# Patient Record
Sex: Female | Born: 1945
Health system: Southern US, Community
[De-identification: ages and names within clinical notes are randomized; demographics above are authoritative.]

## PROBLEM LIST (undated history)

## (undated) DIAGNOSIS — F329 Major depressive disorder, single episode, unspecified: Secondary | ICD-10-CM

## (undated) DIAGNOSIS — M1990 Unspecified osteoarthritis, unspecified site: Secondary | ICD-10-CM

## (undated) DIAGNOSIS — M353 Polymyalgia rheumatica: Secondary | ICD-10-CM

## (undated) DIAGNOSIS — F32A Depression, unspecified: Secondary | ICD-10-CM

## (undated) DIAGNOSIS — H269 Unspecified cataract: Secondary | ICD-10-CM

## (undated) DIAGNOSIS — F909 Attention-deficit hyperactivity disorder, unspecified type: Secondary | ICD-10-CM

## (undated) DIAGNOSIS — I10 Essential (primary) hypertension: Secondary | ICD-10-CM

## (undated) DIAGNOSIS — F0390 Unspecified dementia without behavioral disturbance: Secondary | ICD-10-CM

## (undated) DIAGNOSIS — H409 Unspecified glaucoma: Secondary | ICD-10-CM

## (undated) HISTORY — DX: Attention-deficit hyperactivity disorder, unspecified type: F90.9

## (undated) HISTORY — DX: Unspecified dementia, unspecified severity, without behavioral disturbance, psychotic disturbance, mood disturbance, and anxiety: F03.90

## (undated) HISTORY — DX: Major depressive disorder, single episode, unspecified: F32.9

## (undated) HISTORY — DX: Unspecified osteoarthritis, unspecified site: M19.90

## (undated) HISTORY — DX: Polymyalgia rheumatica: M35.3

## (undated) HISTORY — DX: Unspecified cataract: H26.9

## (undated) HISTORY — DX: Depression, unspecified: F32.A

## (undated) HISTORY — DX: Unspecified glaucoma: H40.9

## (undated) HISTORY — DX: Essential (primary) hypertension: I10

---

## 1999-06-16 ENCOUNTER — Ambulatory Visit (HOSPITAL_COMMUNITY): Admission: RE | Admit: 1999-06-16 | Discharge: 1999-06-16 | Payer: Self-pay | Admitting: Family Medicine

## 1999-06-16 ENCOUNTER — Encounter: Payer: Self-pay | Admitting: Family Medicine

## 1999-06-23 ENCOUNTER — Encounter: Payer: Self-pay | Admitting: Family Medicine

## 1999-06-23 ENCOUNTER — Ambulatory Visit (HOSPITAL_COMMUNITY): Admission: RE | Admit: 1999-06-23 | Discharge: 1999-06-23 | Payer: Self-pay | Admitting: Family Medicine

## 1999-10-06 ENCOUNTER — Other Ambulatory Visit: Admission: RE | Admit: 1999-10-06 | Discharge: 1999-10-06 | Payer: Self-pay | Admitting: Family Medicine

## 1999-12-29 ENCOUNTER — Ambulatory Visit (HOSPITAL_COMMUNITY): Admission: RE | Admit: 1999-12-29 | Discharge: 1999-12-29 | Payer: Self-pay | Admitting: Family Medicine

## 1999-12-29 ENCOUNTER — Encounter: Payer: Self-pay | Admitting: Family Medicine

## 2000-07-12 ENCOUNTER — Encounter: Payer: Self-pay | Admitting: Family Medicine

## 2000-07-12 ENCOUNTER — Encounter: Admission: RE | Admit: 2000-07-12 | Discharge: 2000-07-12 | Payer: Self-pay | Admitting: Family Medicine

## 2001-01-02 ENCOUNTER — Encounter: Payer: Self-pay | Admitting: Family Medicine

## 2001-01-02 ENCOUNTER — Encounter: Admission: RE | Admit: 2001-01-02 | Discharge: 2001-01-02 | Payer: Self-pay | Admitting: Family Medicine

## 2001-08-09 ENCOUNTER — Ambulatory Visit (HOSPITAL_COMMUNITY): Admission: RE | Admit: 2001-08-09 | Discharge: 2001-08-09 | Payer: Self-pay | Admitting: Family Medicine

## 2001-08-09 ENCOUNTER — Encounter: Payer: Self-pay | Admitting: Family Medicine

## 2001-08-27 ENCOUNTER — Other Ambulatory Visit: Admission: RE | Admit: 2001-08-27 | Discharge: 2001-08-27 | Payer: Self-pay | Admitting: Family Medicine

## 2002-05-08 ENCOUNTER — Encounter: Admission: RE | Admit: 2002-05-08 | Discharge: 2002-07-22 | Payer: Self-pay | Admitting: Family Medicine

## 2002-08-20 ENCOUNTER — Ambulatory Visit (HOSPITAL_COMMUNITY): Admission: RE | Admit: 2002-08-20 | Discharge: 2002-08-20 | Payer: Self-pay | Admitting: Family Medicine

## 2002-08-20 ENCOUNTER — Encounter: Payer: Self-pay | Admitting: Family Medicine

## 2002-10-14 ENCOUNTER — Other Ambulatory Visit: Admission: RE | Admit: 2002-10-14 | Discharge: 2002-10-14 | Payer: Self-pay | Admitting: Family Medicine

## 2002-11-19 ENCOUNTER — Encounter: Admission: RE | Admit: 2002-11-19 | Discharge: 2002-11-19 | Payer: Self-pay | Admitting: Rheumatology

## 2002-11-19 ENCOUNTER — Encounter: Payer: Self-pay | Admitting: Rheumatology

## 2003-09-16 ENCOUNTER — Encounter: Admission: RE | Admit: 2003-09-16 | Discharge: 2003-09-16 | Payer: Self-pay | Admitting: Obstetrics and Gynecology

## 2003-10-22 ENCOUNTER — Other Ambulatory Visit: Admission: RE | Admit: 2003-10-22 | Discharge: 2003-10-22 | Payer: Self-pay | Admitting: Family Medicine

## 2004-01-21 ENCOUNTER — Encounter: Admission: RE | Admit: 2004-01-21 | Discharge: 2004-01-21 | Payer: Self-pay | Admitting: Rheumatology

## 2004-10-20 ENCOUNTER — Encounter: Admission: RE | Admit: 2004-10-20 | Discharge: 2004-10-20 | Payer: Self-pay | Admitting: Family Medicine

## 2005-10-24 ENCOUNTER — Other Ambulatory Visit: Admission: RE | Admit: 2005-10-24 | Discharge: 2005-10-24 | Payer: Self-pay | Admitting: Family Medicine

## 2005-11-17 ENCOUNTER — Encounter: Admission: RE | Admit: 2005-11-17 | Discharge: 2005-11-17 | Payer: Self-pay | Admitting: Family Medicine

## 2006-11-06 ENCOUNTER — Other Ambulatory Visit: Admission: RE | Admit: 2006-11-06 | Discharge: 2006-11-06 | Payer: Self-pay | Admitting: Family Medicine

## 2006-11-19 ENCOUNTER — Encounter: Admission: RE | Admit: 2006-11-19 | Discharge: 2006-11-19 | Payer: Self-pay | Admitting: Family Medicine

## 2007-04-24 ENCOUNTER — Encounter: Admission: RE | Admit: 2007-04-24 | Discharge: 2007-04-24 | Payer: Self-pay | Admitting: Rheumatology

## 2007-11-18 ENCOUNTER — Encounter: Admission: RE | Admit: 2007-11-18 | Discharge: 2008-01-13 | Payer: Self-pay | Admitting: Orthopedic Surgery

## 2007-11-20 ENCOUNTER — Encounter: Admission: RE | Admit: 2007-11-20 | Discharge: 2007-11-20 | Payer: Self-pay | Admitting: Orthopedic Surgery

## 2007-11-21 ENCOUNTER — Encounter: Admission: RE | Admit: 2007-11-21 | Discharge: 2007-11-21 | Payer: Self-pay | Admitting: Family Medicine

## 2008-01-30 ENCOUNTER — Inpatient Hospital Stay (HOSPITAL_COMMUNITY): Admission: RE | Admit: 2008-01-30 | Discharge: 2008-02-03 | Payer: Self-pay | Admitting: Orthopedic Surgery

## 2008-04-02 ENCOUNTER — Encounter: Admission: RE | Admit: 2008-04-02 | Discharge: 2008-05-27 | Payer: Self-pay | Admitting: Orthopedic Surgery

## 2008-08-14 ENCOUNTER — Encounter: Admission: RE | Admit: 2008-08-14 | Discharge: 2008-08-14 | Payer: Self-pay | Admitting: Orthopedic Surgery

## 2009-09-27 ENCOUNTER — Encounter: Admission: RE | Admit: 2009-09-27 | Discharge: 2009-09-27 | Payer: Self-pay | Admitting: Orthopedic Surgery

## 2009-10-09 HISTORY — PX: PARTIAL HIP ARTHROPLASTY: SHX733

## 2009-11-10 ENCOUNTER — Encounter: Admission: RE | Admit: 2009-11-10 | Discharge: 2009-11-10 | Payer: Self-pay | Admitting: Family Medicine

## 2009-11-17 ENCOUNTER — Encounter: Payer: Self-pay | Admitting: Orthopedic Surgery

## 2009-11-23 ENCOUNTER — Inpatient Hospital Stay (HOSPITAL_COMMUNITY): Admission: RE | Admit: 2009-11-23 | Discharge: 2009-11-27 | Payer: Self-pay | Admitting: Orthopedic Surgery

## 2010-01-20 ENCOUNTER — Encounter: Admission: RE | Admit: 2010-01-20 | Discharge: 2010-03-16 | Payer: Self-pay | Admitting: Orthopedic Surgery

## 2010-11-10 ENCOUNTER — Other Ambulatory Visit: Payer: Self-pay | Admitting: Family Medicine

## 2010-11-10 DIAGNOSIS — Z1231 Encounter for screening mammogram for malignant neoplasm of breast: Secondary | ICD-10-CM

## 2010-11-10 DIAGNOSIS — Z1239 Encounter for other screening for malignant neoplasm of breast: Secondary | ICD-10-CM

## 2010-11-17 ENCOUNTER — Ambulatory Visit
Admission: RE | Admit: 2010-11-17 | Discharge: 2010-11-17 | Disposition: A | Discharge status: Home / Self Care | Payer: MEDICARE | Source: Ambulatory Visit | Attending: Family Medicine | Admitting: Family Medicine

## 2010-11-17 DIAGNOSIS — Z1231 Encounter for screening mammogram for malignant neoplasm of breast: Secondary | ICD-10-CM

## 2010-12-09 IMAGING — CR DG CHEST 2V
2 series · 2 of 2 positions shown · non-contrast
Comparison: 01/24/2008

CLINICAL DATA: Degenerative joint disease.  Preop.

CHEST - 2 VIEW

[view not recorded (1 of 2)]
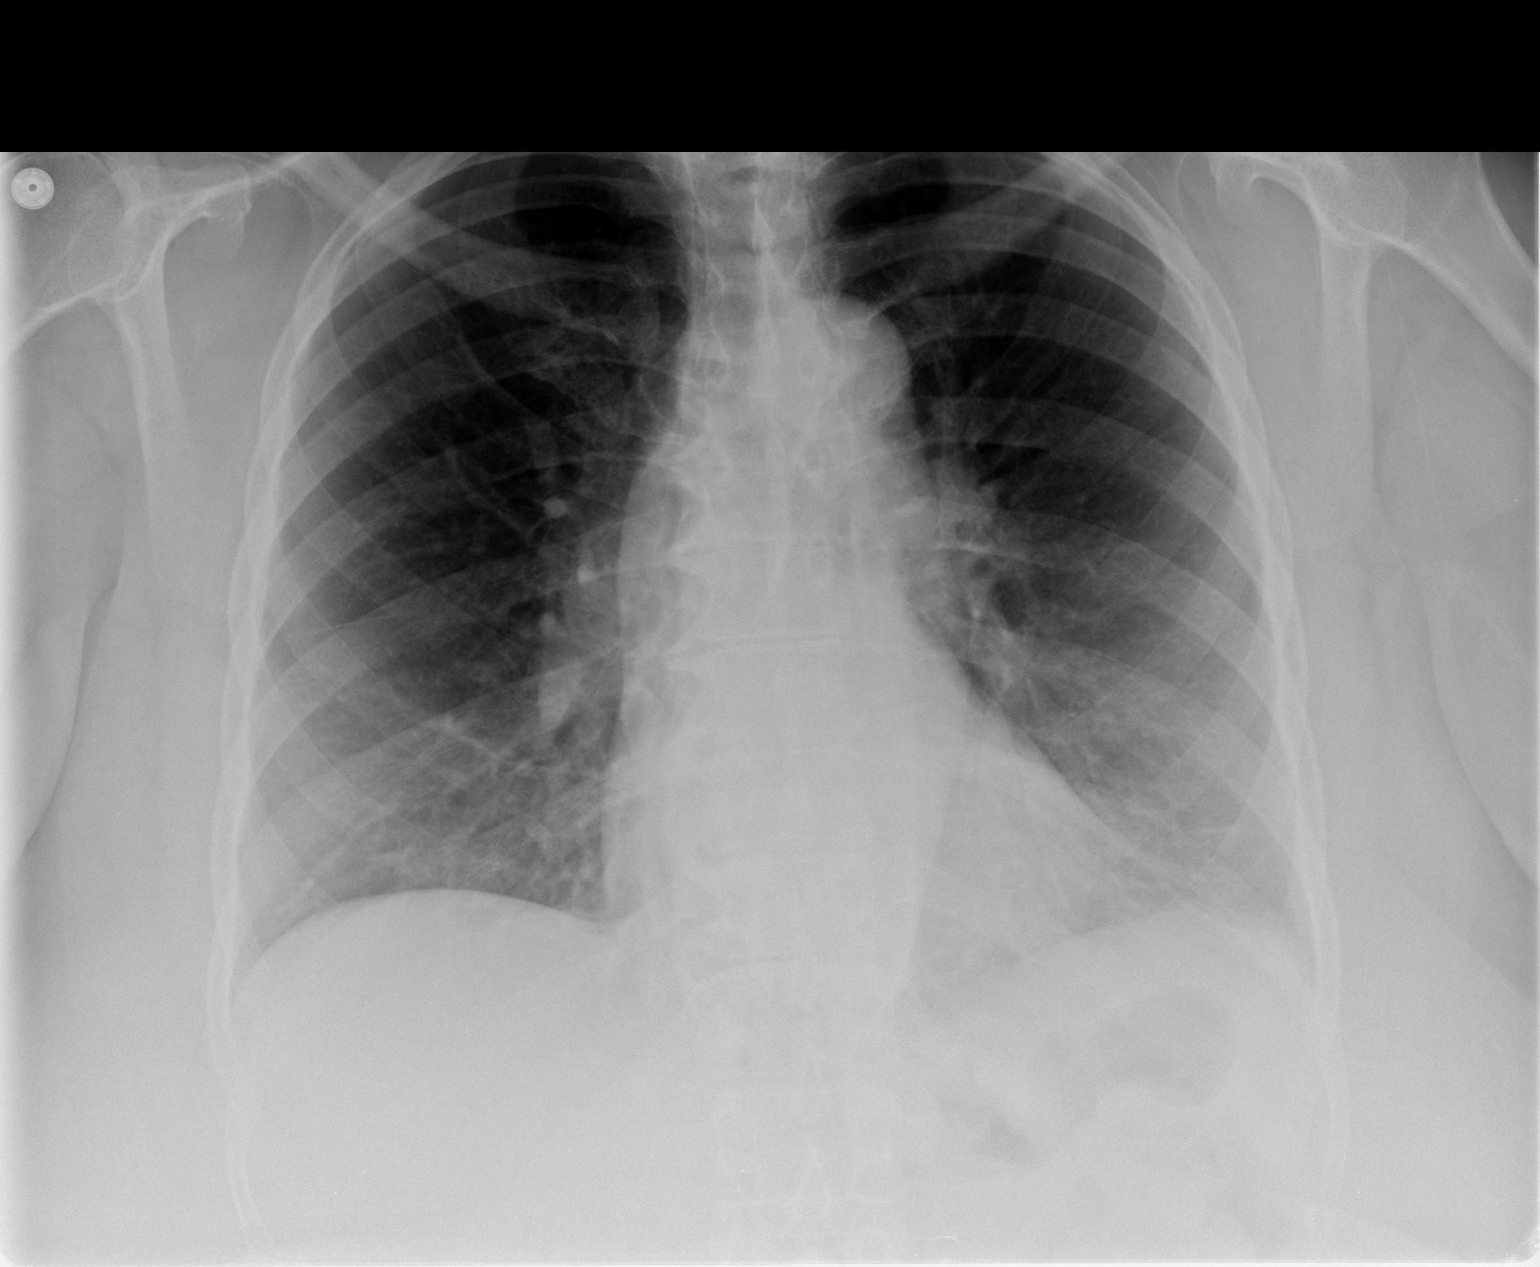

[view not recorded (2 of 2)]
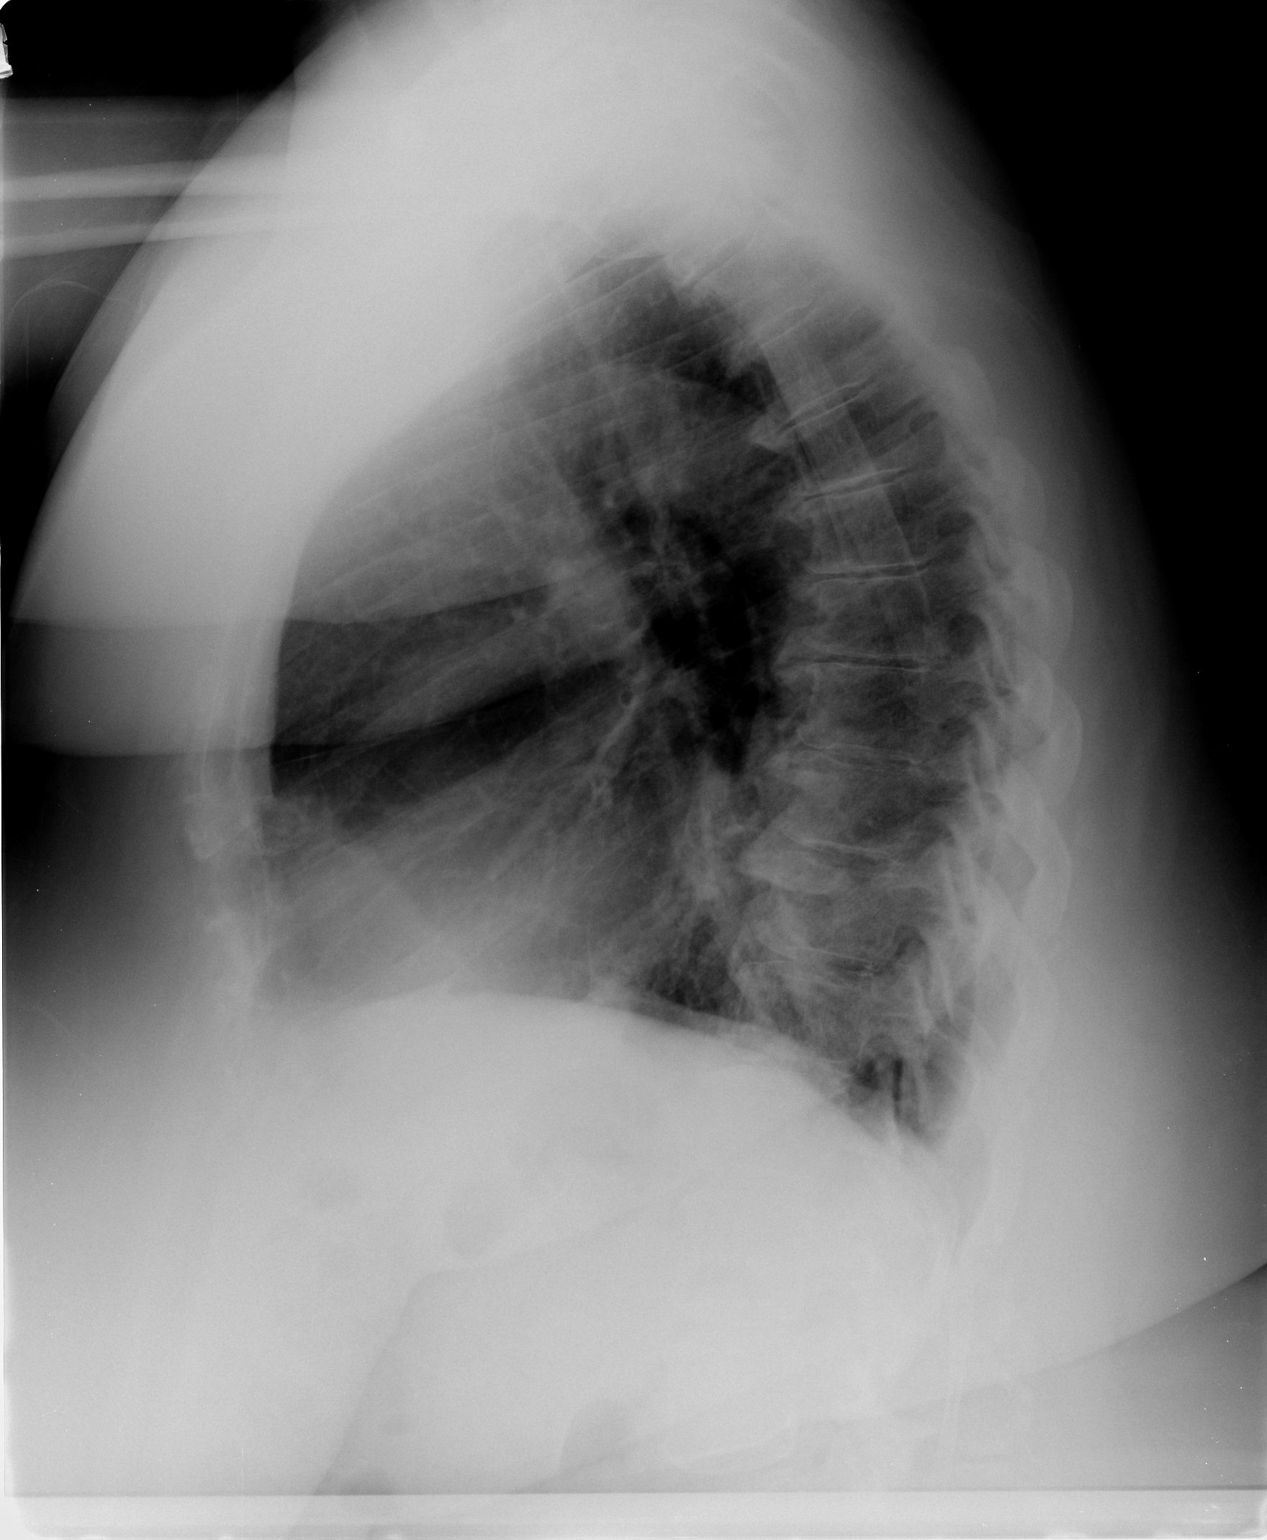

[2 of 2 positions shown; findings below may reference images not displayed]

FINDINGS: The heart is borderline enlarged.  Pulmonary vascularity
is within normal limits.  There is no definite consolidation.
Linear atelectasis has developed at the left base.  No pneumothorax
or pleural effusion.
IMPRESSION: Borderline cardiomegaly without pulmonary edema.  Linear
atelectasis at the left base.

## 2010-12-28 LAB — CBC
MCHC: 34.4 g/dL (ref 30.0–36.0)
MCV: 92.4 fL (ref 78.0–100.0)
Platelets: 214 10*3/uL (ref 150–400)
RDW: 13.4 % (ref 11.5–15.5)

## 2010-12-28 LAB — PROTIME-INR
INR: 1.95 — ABNORMAL HIGH (ref 0.00–1.49)
INR: 2.39 — ABNORMAL HIGH (ref 0.00–1.49)
Prothrombin Time: 14.6 seconds (ref 11.6–15.2)
Prothrombin Time: 22.1 seconds — ABNORMAL HIGH (ref 11.6–15.2)
Prothrombin Time: 25.1 seconds — ABNORMAL HIGH (ref 11.6–15.2)

## 2010-12-28 LAB — COMPREHENSIVE METABOLIC PANEL
ALT: 14 U/L (ref 0–35)
AST: 18 U/L (ref 0–37)
CO2: 28 mEq/L (ref 19–32)
Calcium: 9.5 mg/dL (ref 8.4–10.5)
Creatinine, Ser: 0.71 mg/dL (ref 0.4–1.2)
GFR calc Af Amer: >60 mL/min (ref >60)
GFR calc non Af Amer: >60 mL/min (ref >60)
Sodium: 138 mEq/L (ref 135–145)
Total Protein: 7.4 g/dL (ref 6.0–8.3)

## 2010-12-28 LAB — URINALYSIS, ROUTINE W REFLEX MICROSCOPIC
Glucose, UA: NEGATIVE mg/dL
Hgb urine dipstick: NEGATIVE
Ketones, ur: NEGATIVE mg/dL
Protein, ur: NEGATIVE mg/dL
Urobilinogen, UA: 0.2 mg/dL (ref 0.0–1.0)

## 2010-12-28 LAB — TYPE AND SCREEN
ABO/RH(D): B POS
Antibody Screen: NEGATIVE

## 2010-12-28 LAB — APTT: aPTT: 35 seconds (ref 24–37)

## 2011-01-17 ENCOUNTER — Other Ambulatory Visit (HOSPITAL_COMMUNITY)
Admission: RE | Admit: 2011-01-17 | Discharge: 2011-01-17 | Disposition: A | Discharge status: Home / Self Care | Payer: MEDICARE | Source: Ambulatory Visit | Attending: Family Medicine | Admitting: Family Medicine

## 2011-01-17 DIAGNOSIS — Z01419 Encounter for gynecological examination (general) (routine) without abnormal findings: Secondary | ICD-10-CM | POA: Insufficient documentation

## 2011-01-17 DIAGNOSIS — Z1159 Encounter for screening for other viral diseases: Secondary | ICD-10-CM | POA: Insufficient documentation

## 2011-02-06 ENCOUNTER — Encounter: Payer: Medicare Other | Attending: Family Medicine | Admitting: *Deleted

## 2011-02-06 DIAGNOSIS — E669 Obesity, unspecified: Secondary | ICD-10-CM | POA: Insufficient documentation

## 2011-02-06 DIAGNOSIS — Z713 Dietary counseling and surveillance: Secondary | ICD-10-CM | POA: Insufficient documentation

## 2011-02-21 NOTE — Op Note (Signed)
NAMESHERRONDA, SWEIGERT NO.:  [id number]   MEDICAL RECORD NO.:  [id number]          PATIENT TYPE:  INP   LOCATION:  5037                         FACILITY:  [facility]   PHYSICIAN:  Myrtie Neither, MD      DATE OF BIRTH:  September 09, 1946   DATE OF PROCEDURE:  01/30/2008  DATE OF DISCHARGE:                               OPERATIVE REPORT   PREOPERATIVE DIAGNOSIS:  Degenerative joint disease, left hip.   POSTOPERATIVE DIAGNOSES:  Degenerative joint disease, left hip.   ANESTHESIA:  General.   PROCEDURE:  Left total hip arthroplasty, Biomet implant.  The patient  taken to the operating room.  After given adequate preop medications  given general anesthesia and was intubated.  The patient was placed in  the left lateral position, left hip was prepped with DuraPrep and draped  in sterile manner.  Bovie was used for hemostasis.  Posterior southern  approach was made over the left hip going through the skin and  subcutaneous tissue down to the fascia.  Short rotators identified and  resected.  Cast was identified and completely resected.  With use of an  oscillating saw, femoral head had to be cut in order to dislocate the  joint.  Neck cutting guide was put in place and appropriate cut of the  neck was done.  Femoral head was removed.  Osteophytes about the neck  was resected with the use of an osteotome.  Reaming of the acetabulum  was then started with 43 mm.  Reaming was done up to 53 acetabular for a  54 component.  Reaming was done down to good bleeding bone.  Trial was  put in place and found to seat quite well.  Acetabular cup porous coated  with 3 holes was then put in place and further fixed with 3 acetabular  screws.  Attention was then turned to the femur, further resection of  the neck was done.  Followed by reaming down the femoral canal and use  of broaches was done up to 12 mm, was found to have good calcar and bony  contact filling proximal end  quite well.  Calcar  reamer was put in  place and further calcar resection was done.  Standard head and neck was  then tried, hip was reduced.  Good leg length, good flexion and  extension, good stability, and no telescoping.  Trials were removed.  Copious irrigation done of the acetabulum and femoral canal.  The kind  of components that were put in place was a 54-mm porous-coated shell  with 3 holes.  The 3 bone screws display the stability of the  acetabulum.  The acetabular liner 10 degree 36-mm and a 36-mm head type  1 standard.  With seeding of the implants, reduction was then done again  and full flexion and full extension, good internal and external  rotation, and good stability was demonstrated.  Further copious  irrigation was done followed by wound closure with the use of 0 Vicryl  for the fascia, 2-0 for the subcutaneous, and skin staples for the skin.  Compressive dressing was applied and hip abduction  pillow was applied.  The patient tolerated the procedure quite well and went to recovery room  in stable and satisfactory condition.      Myrtie Neither, MD  Electronically Signed     AC/MEDQ  D:  01/30/2008  T:  01/31/2008  Job:  [id number]

## 2011-02-24 NOTE — Discharge Summary (Signed)
NAMELUVENIA, CRANFORD NO.:  [id number]   MEDICAL RECORD NO.:  [id number]          PATIENT TYPE:  INP   LOCATION:  5037                         FACILITY:  [facility]   PHYSICIAN:  Myrtie Neither, MD      DATE OF BIRTH:  1946/04/16   DATE OF ADMISSION:  01/30/2008  DATE OF DISCHARGE:  02/03/2008                               DISCHARGE SUMMARY   ADMITTING DIAGNOSIS:  Degenerative joint disease, left hip.   DISCHARGE DIAGNOSIS:  Degenerative joint disease, left hip.   COMPLICATIONS:  None.   INFECTION:  None.   OPERATION:  Left total hip arthroplasty, Biomet implant.   PERTINENT HISTORY:  This is a 65 year old female who had been followed  in the office for degenerative arthritis of her left hip which  progressively worsened over the past few months.  The patient also has  had difficulty with arthritis in the right knee as well.  The patient  was using walker as well as cane with anti-inflammatories and pain  medications.  Pertinent physical was that of a left hip very ankylotic,  limited rotation, and minimal difficulty on flexion, palpable and  audible click and crepitus on attempted range of motion.  X-rays  revealed loss of joint space sclerosis and degenerative joint changes.   HOSPITAL COURSE:  The patient underwent preop medical evaluation with  CBC, EKG, chest x-ray, PT, PTT, platelet count, UA, and CMET.  The  patient's laboratories were stable enough to undergo surgery and  underwent left total hip arthroplasty.  Postop course was fairly benign.  The patient was started on partial weightbearing on the left side with  use of walker, prophylactic IV antibiotics as well as prophylactic  Coumadin.  The patient progressed fairly well with physical therapy, and  pain was brought under control and was able to be discharged on Percocet  1-2 q.6. h. p.r.n., Skelaxin 400 mg 2 b.i.d., Coumadin 5 mg daily for  INRs, Feosol 325 mg b.i.d.  Home health and physical  therapy were  arranged.  The patient progressed well enough to be discharged and will  return to the office in 1 week.   CONDITION ON DISCHARGE:  She was discharged in stable and satisfactory  condition.      Myrtie Neither, MD  Electronically Signed     AC/MEDQ  D:  03/04/2008  T:  03/05/2008  Job:  [id number]

## 2011-04-03 ENCOUNTER — Ambulatory Visit: Payer: Medicare Other | Admitting: *Deleted

## 2011-04-06 ENCOUNTER — Encounter: Payer: Medicare Other | Attending: Family Medicine | Admitting: *Deleted

## 2011-04-06 DIAGNOSIS — Z713 Dietary counseling and surveillance: Secondary | ICD-10-CM | POA: Insufficient documentation

## 2011-04-06 DIAGNOSIS — E669 Obesity, unspecified: Secondary | ICD-10-CM | POA: Insufficient documentation

## 2011-07-04 LAB — URINALYSIS, ROUTINE W REFLEX MICROSCOPIC
Bilirubin Urine: NEGATIVE
Glucose, UA: NEGATIVE
Ketones, ur: NEGATIVE
Protein, ur: NEGATIVE

## 2011-07-04 LAB — CBC
HCT: 27.3 — ABNORMAL LOW
HCT: 30.2 — ABNORMAL LOW
HCT: 40.6
Hemoglobin: 13.9
MCHC: 33.8
MCHC: 34.3
MCHC: 35.1
MCV: 91.6
MCV: 91.8
Platelets: 157
Platelets: 195
Platelets: 248
RDW: 13.1
RDW: 13.1
RDW: 13.2
WBC: 10.4

## 2011-07-04 LAB — COMPREHENSIVE METABOLIC PANEL
ALT: 13
CO2: 23
Calcium: 9.6
Creatinine, Ser: 0.56
GFR calc non Af Amer: >60
Glucose, Bld: 103 — ABNORMAL HIGH
Total Bilirubin: 0.5

## 2011-07-04 LAB — APTT: aPTT: 32

## 2011-07-04 LAB — PROTIME-INR
Prothrombin Time: 12.3
Prothrombin Time: 22.3 — ABNORMAL HIGH

## 2011-07-04 LAB — ABO/RH: ABO/RH(D): B POS

## 2011-07-06 ENCOUNTER — Ambulatory Visit: Payer: Medicare Other | Admitting: *Deleted

## 2011-10-10 HISTORY — PX: REPLACEMENT TOTAL KNEE: SUR1224

## 2011-11-17 ENCOUNTER — Other Ambulatory Visit: Payer: Self-pay | Admitting: Family Medicine

## 2011-11-17 DIAGNOSIS — Z1231 Encounter for screening mammogram for malignant neoplasm of breast: Secondary | ICD-10-CM

## 2011-12-05 ENCOUNTER — Other Ambulatory Visit: Payer: Self-pay | Admitting: Family Medicine

## 2011-12-05 ENCOUNTER — Ambulatory Visit
Admission: RE | Admit: 2011-12-05 | Discharge: 2011-12-05 | Disposition: A | Discharge status: Home / Self Care | Payer: Medicare Other | Source: Ambulatory Visit | Attending: Family Medicine | Admitting: Family Medicine

## 2011-12-05 DIAGNOSIS — Z1231 Encounter for screening mammogram for malignant neoplasm of breast: Secondary | ICD-10-CM

## 2013-07-04 ENCOUNTER — Other Ambulatory Visit: Payer: Self-pay

## 2013-07-04 DIAGNOSIS — Z1231 Encounter for screening mammogram for malignant neoplasm of breast: Secondary | ICD-10-CM

## 2013-07-24 ENCOUNTER — Ambulatory Visit
Admission: RE | Admit: 2013-07-24 | Discharge: 2013-07-24 | Disposition: A | Discharge status: Home / Self Care | Payer: Medicare Other | Source: Ambulatory Visit

## 2013-07-24 DIAGNOSIS — Z1231 Encounter for screening mammogram for malignant neoplasm of breast: Secondary | ICD-10-CM

## 2014-07-21 ENCOUNTER — Other Ambulatory Visit: Payer: Self-pay

## 2014-07-21 DIAGNOSIS — Z1239 Encounter for other screening for malignant neoplasm of breast: Secondary | ICD-10-CM

## 2014-08-11 ENCOUNTER — Encounter (INDEPENDENT_AMBULATORY_CARE_PROVIDER_SITE_OTHER): Payer: Self-pay

## 2014-08-11 ENCOUNTER — Ambulatory Visit
Admission: RE | Admit: 2014-08-11 | Discharge: 2014-08-11 | Disposition: A | Discharge status: Home / Self Care | Payer: Medicare Other | Source: Ambulatory Visit

## 2014-08-11 DIAGNOSIS — Z1239 Encounter for other screening for malignant neoplasm of breast: Secondary | ICD-10-CM

## 2015-08-10 ENCOUNTER — Other Ambulatory Visit: Payer: Self-pay

## 2015-08-10 DIAGNOSIS — Z1231 Encounter for screening mammogram for malignant neoplasm of breast: Secondary | ICD-10-CM

## 2015-08-25 ENCOUNTER — Ambulatory Visit
Admission: RE | Admit: 2015-08-25 | Discharge: 2015-08-25 | Disposition: A | Discharge status: Home / Self Care | Payer: PPO | Source: Ambulatory Visit

## 2015-08-25 DIAGNOSIS — Z1231 Encounter for screening mammogram for malignant neoplasm of breast: Secondary | ICD-10-CM

## 2015-11-06 DIAGNOSIS — L235 Allergic contact dermatitis due to other chemical products: Secondary | ICD-10-CM | POA: Diagnosis not present

## 2015-12-29 DIAGNOSIS — R7309 Other abnormal glucose: Secondary | ICD-10-CM | POA: Diagnosis not present

## 2015-12-29 DIAGNOSIS — E6609 Other obesity due to excess calories: Secondary | ICD-10-CM | POA: Diagnosis not present

## 2015-12-29 DIAGNOSIS — I10 Essential (primary) hypertension: Secondary | ICD-10-CM | POA: Diagnosis not present

## 2015-12-29 DIAGNOSIS — M1250 Traumatic arthropathy, unspecified site: Secondary | ICD-10-CM | POA: Diagnosis not present

## 2016-01-01 DIAGNOSIS — F064 Anxiety disorder due to known physiological condition: Secondary | ICD-10-CM | POA: Diagnosis not present

## 2016-01-01 DIAGNOSIS — F900 Attention-deficit hyperactivity disorder, predominantly inattentive type: Secondary | ICD-10-CM | POA: Diagnosis not present

## 2016-01-01 DIAGNOSIS — F331 Major depressive disorder, recurrent, moderate: Secondary | ICD-10-CM | POA: Diagnosis not present

## 2016-02-10 DIAGNOSIS — M1200 Chronic postrheumatic arthropathy [Jaccoud], unspecified site: Secondary | ICD-10-CM | POA: Diagnosis not present

## 2016-02-10 DIAGNOSIS — I10 Essential (primary) hypertension: Secondary | ICD-10-CM | POA: Diagnosis not present

## 2016-02-10 DIAGNOSIS — F331 Major depressive disorder, recurrent, moderate: Secondary | ICD-10-CM | POA: Diagnosis not present

## 2016-02-10 DIAGNOSIS — E6609 Other obesity due to excess calories: Secondary | ICD-10-CM | POA: Diagnosis not present

## 2016-05-03 DIAGNOSIS — E0800 Diabetes mellitus due to underlying condition with hyperosmolarity without nonketotic hyperglycemic-hyperosmolar coma (NKHHC): Secondary | ICD-10-CM | POA: Diagnosis not present

## 2016-05-03 DIAGNOSIS — F900 Attention-deficit hyperactivity disorder, predominantly inattentive type: Secondary | ICD-10-CM | POA: Diagnosis not present

## 2016-05-03 DIAGNOSIS — F064 Anxiety disorder due to known physiological condition: Secondary | ICD-10-CM | POA: Diagnosis not present

## 2016-05-03 DIAGNOSIS — I10 Essential (primary) hypertension: Secondary | ICD-10-CM | POA: Diagnosis not present

## 2016-06-28 DIAGNOSIS — H40033 Anatomical narrow angle, bilateral: Secondary | ICD-10-CM | POA: Diagnosis not present

## 2016-06-28 DIAGNOSIS — H04123 Dry eye syndrome of bilateral lacrimal glands: Secondary | ICD-10-CM | POA: Diagnosis not present

## 2016-07-27 ENCOUNTER — Other Ambulatory Visit: Payer: Self-pay | Admitting: Family Medicine

## 2016-07-27 DIAGNOSIS — Z1231 Encounter for screening mammogram for malignant neoplasm of breast: Secondary | ICD-10-CM

## 2016-08-04 DIAGNOSIS — H04123 Dry eye syndrome of bilateral lacrimal glands: Secondary | ICD-10-CM | POA: Diagnosis not present

## 2016-08-25 ENCOUNTER — Ambulatory Visit
Admission: RE | Admit: 2016-08-25 | Discharge: 2016-08-25 | Disposition: A | Discharge status: Home / Self Care | Payer: PPO | Source: Ambulatory Visit | Attending: Family Medicine | Admitting: Family Medicine

## 2016-08-25 DIAGNOSIS — Z1231 Encounter for screening mammogram for malignant neoplasm of breast: Secondary | ICD-10-CM | POA: Diagnosis not present

## 2016-08-30 ENCOUNTER — Other Ambulatory Visit: Payer: Self-pay | Admitting: Family Medicine

## 2016-08-30 DIAGNOSIS — R928 Other abnormal and inconclusive findings on diagnostic imaging of breast: Secondary | ICD-10-CM

## 2016-09-06 ENCOUNTER — Ambulatory Visit
Admission: RE | Admit: 2016-09-06 | Discharge: 2016-09-06 | Disposition: A | Discharge status: Home / Self Care | Payer: PPO | Source: Ambulatory Visit | Attending: Family Medicine | Admitting: Family Medicine

## 2016-09-06 ENCOUNTER — Other Ambulatory Visit: Payer: Self-pay | Admitting: Family Medicine

## 2016-09-06 DIAGNOSIS — R921 Mammographic calcification found on diagnostic imaging of breast: Secondary | ICD-10-CM | POA: Diagnosis not present

## 2016-09-06 DIAGNOSIS — R928 Other abnormal and inconclusive findings on diagnostic imaging of breast: Secondary | ICD-10-CM

## 2016-09-07 ENCOUNTER — Ambulatory Visit
Admission: RE | Admit: 2016-09-07 | Discharge: 2016-09-07 | Disposition: A | Discharge status: Home / Self Care | Payer: PPO | Source: Ambulatory Visit | Attending: Family Medicine | Admitting: Family Medicine

## 2016-09-07 DIAGNOSIS — N6311 Unspecified lump in the right breast, upper outer quadrant: Secondary | ICD-10-CM | POA: Diagnosis not present

## 2016-09-07 DIAGNOSIS — R928 Other abnormal and inconclusive findings on diagnostic imaging of breast: Secondary | ICD-10-CM

## 2016-09-07 DIAGNOSIS — D241 Benign neoplasm of right breast: Secondary | ICD-10-CM | POA: Diagnosis not present

## 2016-09-18 DIAGNOSIS — E119 Type 2 diabetes mellitus without complications: Secondary | ICD-10-CM | POA: Diagnosis not present

## 2016-09-18 DIAGNOSIS — F064 Anxiety disorder due to known physiological condition: Secondary | ICD-10-CM | POA: Diagnosis not present

## 2016-09-18 DIAGNOSIS — F900 Attention-deficit hyperactivity disorder, predominantly inattentive type: Secondary | ICD-10-CM | POA: Diagnosis not present

## 2016-09-18 DIAGNOSIS — I10 Essential (primary) hypertension: Secondary | ICD-10-CM | POA: Diagnosis not present

## 2016-09-26 DIAGNOSIS — H04123 Dry eye syndrome of bilateral lacrimal glands: Secondary | ICD-10-CM | POA: Diagnosis not present

## 2016-11-24 DIAGNOSIS — I10 Essential (primary) hypertension: Secondary | ICD-10-CM | POA: Diagnosis not present

## 2016-11-24 DIAGNOSIS — F331 Major depressive disorder, recurrent, moderate: Secondary | ICD-10-CM | POA: Diagnosis not present

## 2016-11-24 DIAGNOSIS — F900 Attention-deficit hyperactivity disorder, predominantly inattentive type: Secondary | ICD-10-CM | POA: Diagnosis not present

## 2016-12-28 ENCOUNTER — Encounter: Payer: PPO | Attending: Psychology | Admitting: Psychology

## 2016-12-28 DIAGNOSIS — F331 Major depressive disorder, recurrent, moderate: Secondary | ICD-10-CM | POA: Insufficient documentation

## 2016-12-28 DIAGNOSIS — F909 Attention-deficit hyperactivity disorder, unspecified type: Secondary | ICD-10-CM | POA: Diagnosis not present

## 2016-12-28 DIAGNOSIS — F900 Attention-deficit hyperactivity disorder, predominantly inattentive type: Secondary | ICD-10-CM | POA: Diagnosis not present

## 2017-01-10 NOTE — Progress Notes (Signed)
Neuropsychological Consultation   Patient:   Pam Knapp   DOB:   1946/07/19  MR Number:  [id number]  Location:  [facility] [address], [place] [id number] [place] [address] Dept: [phone]           Date of Service:   12/28/2016  Start Time:   11 AM End Time:   12 PM  Provider/Observer:  Ilean Skill, Psy.D.       Clinical Neuropsychologist       Billing Code/Service: Neurobehavioral Status Interview  Chief Complaint:    The patient reports that she has long-standing issues with attention concentration the patient reports that she saw someone but she was unsure of who was around 10 years ago for issues of hypertension in constant duration she reports that her attentional issues are causing significant difficulties in her attempts to further education. The patient reports that now she is pretty positive that her attentional issues all her life were related to ADHD. However, the patient reports that her memory has been worse recently than 10 years ago. She reports that she is not sure whether this worsening is because of ADHD or something else. The patient describes poor attention, poor organization and poor planning. She does report that all of these symptoms appear to be worsening. The patient reports that she never took any medications or had any interventions for her suspected attention deficit disorder. The patient also reports that she has a history of depression and has been treated with various SSRI medications. The patient denies any geographic disorientation for changes in her language functioning. She reports that she has been having some orthopedic issues more recently and has had both knee and hip replacement interventions.  Reason for Service:  The patient is a 71 year old female that was was referred for neuropsychological evaluation by Dr. Criss Rosales.  The  purpose of this evaluation was to help with differential diagnostic considerations regarding the patient's complaint of attention and concentration deficits and memory deficits. She does acknowledge some worsening over the past 10 years but reports that she is always had these issues and they have been present her entire life.  Current Status:  The patient describes ongoing and worsening issues with attention concentration and memory issues. She denies any sudden changes in cognitive functioning but more of a steady change over time. She has had some issues with orthopedic-based pain as well as history of dealing with depression.  Reliability of Information: Information is provided by the patient as well as review of available medical records.  Behavioral Observation: Pam Knapp  presents as a 71 y.o.-year-old Right African American Female who appeared her stated age. her dress was Appropriate and she was Well Groomed and her manners were Appropriate to the situation.  her participation was indicative of Appropriate behaviors.  There were not any physical disabilities noted.  she displayed an appropriate level of cooperation and motivation.     Interactions:    Active Appropriate  Attention:   The patient did appear to be distracted at times during the clinical interview and these appeared more related to internal preoccupations. and attention span appeared shorter than expected for age  Memory:   within normal limits; recent and remote memory intact  Visuo-spatial:  within normal limits  Speech (Volume):  normal  Speech:   normal; normal  Thought Process:  Coherent  Though Content:  WNL; not suicidal  Orientation:  person, place, time/date and situation  Judgment:   Fair  Planning:   Fair  Affect:    Depressed  Mood:    Depressed  Insight:   Fair  Intelligence:   normal  Family Med/Psych History: No family history on file.  Risk of Suicide/Violence: low the patient  denies any suicidal or homicidal ideation  Impression/DX:  The patient describes a long history of difficulties with attention and concentration. She reports that these issues have been present for her entire life. She does acknowledge some perception of worsening of her issues with attention and memory over the past 10 years but no significant or persistent disc decline. She reports that there been a general steady worsening of the symptoms over the past 10 years. The patient is concerned that the attention deficit disorder is playing a role in her current status and has requested an evaluation to aid in differential diagnoses between issues of ADHD, depressive disorder, versus changes due to aging.  Disposition/Plan:  We will set the patient up for neuropsychological testing. Initially we will complete the comprehensive attention battery and the CAB CPT.  Once that is completed to determine if other neuropsychological testing is needed for differential diagnostic purposes.  Diagnosis:    Attention deficit hyperactivity disorder (ADHD), predominantly inattentive type  Moderate episode of recurrent major depressive disorder ([place])         Electronically Signed   _______________________ Ilean Skill, Psy.D.

## 2017-01-18 ENCOUNTER — Encounter: Payer: PPO | Admitting: Psychology

## 2017-02-20 ENCOUNTER — Encounter: Payer: Self-pay | Admitting: Psychology

## 2017-02-20 ENCOUNTER — Encounter: Payer: PPO | Attending: Psychology | Admitting: Psychology

## 2017-02-20 DIAGNOSIS — F331 Major depressive disorder, recurrent, moderate: Secondary | ICD-10-CM | POA: Diagnosis not present

## 2017-02-20 DIAGNOSIS — F900 Attention-deficit hyperactivity disorder, predominantly inattentive type: Secondary | ICD-10-CM | POA: Diagnosis not present

## 2017-02-20 DIAGNOSIS — F909 Attention-deficit hyperactivity disorder, unspecified type: Secondary | ICD-10-CM | POA: Diagnosis not present

## 2017-02-20 NOTE — Progress Notes (Signed)
The patient was administered the Comprehensive Attention Battery and the CAB CPT measures. The patient appeared to fully participate in these testing procedures and this does appear to be a fair and valid sample of her current attentional abilities as well as various aspects of executive functioning. Below are the results of this broad and comprehensive assessment of attention/concentration and executive functioning.  Initially, the patient was administered the auditory/visual reaction time test. These two measures are both pure reaction time measures and are administered in both the visual and auditory modalities. On the visual pure reaction time test, the patient accurately responded to 41 of the 50 targets, which is typically viewed is an impaired score. However, the patient did initially have some difficulties adapting to the touch screen interface and once she got through the first half of this measure her number of errors of omission essentially went to 0. Therefore I do not think this accuracy score is relevant for interpretation and is more reflexive of her initial transition to the testing procedures. her average response time was 363 ms which is within normal limits. The patient was administered the auditory pure reaction time test and she correctly responded to 50 of 50 targets, which is an efficient performance and within normal limits. her average response time was 344 ms, which is also within normal limits.  The patient was then administered the discriminant reaction time test. she was administered the visual, auditory, and mixed subtests. On the visual discriminate reaction time measure, she correctly responded to 35 of 35 targets and had 2 errors of commission and 0 errors of omission. This is an efficient performance and represents a performance that is within normative expectations. her average response time for correctly responded to items was 438 ms which is also within normative  expectations. The patient was then administered the auditory discriminate reaction time measure. she correctly responded to 35 of 35 targets, which is efficient and within normal limits. her average response time was 705 ms, which is within normative expectations. The patient was then administered the mixed discriminate reaction time, which require shifting from between either auditory or visual targets with an alteration between auditory and visual stimuli. This measure require shifting attention on top of discriminate identification and responding.  The patient correctly responded to 26 of the 30 targets and had 4 errors of commission and for errors of omission. This is an efficient score for accuracy.  her average response time for correct responses was 918 ms.  This performance is also within  normal limits and represents adequate abilities to shift focus and attention to changing external stimuli.  The patient was administered the auditory/visual scan reaction time test. On the visual measure the patient correctly responded to 40 of 40 targets and the average response time was within normal limits. The auditory measure resulted in the correct response to 34 of 40 targets with 3 errors of commission and 5 error of omission. her average response time of  1612ms was below normative expectations. The patient did appear to have difficulty with processing this auditory information and once she developed difficulties had difficulty correcting and improving performance. The patient was then administered the mixed auditory visual scan measure and she correctly responded to 35 of 40 targets, which is within normal limits and her response times were 1372 ms was consistent with mild impairments and likely reflects above her difficulty with the auditory portion of this measure.  The patient was then administered the auditory/visual encoding test. On the  auditory forwards the patient's performance was within normal  limits.  On the auditory backwards measures the patient's performance was also within normal limits.  This pattern suggests adequate performance and ability with regard to auditory encoding. On the visual encoding forward measure the patient produced performance that was within normal limits.  On the visual backwards measures the patient's performance was slightly below normative expectations indicative of mild difficulty with visual encoding backwards, which does result in some multi processing requirements visually. Overall, this pattern suggests that auditory encoding is within normal limits and visual encoding is also within normal limits but when there is a multi processing component the patient started showing some mild deficits.  The patient was then administered the Stroop interference cancellation test. This task is broken down into eight separate trials. On the first four trials the patient is presented with a focus execute task that requires the patient to scan a 36 grid layout in which the words red green or blue were randomly printed in each grid. Each of these color words and be printed in either red green or blue color. On half of them, the word matches the color of the font and it is these that the patient is to identify where the color and word match. After the first four trials of this visual scanning measure change to four trials that include a Stroop interference component inwhich the words red green and blue are played randomly over the speakers. On the first four "noninterference" trials the patient produced performances on these focus execute task the patient showed significant difficulties and deficits with this focus execute task with visual distraction. The patient's performance was severely impaired even after correcting for age. This pattern continued throughout this measure. Performance did improve slightly with practice but not to the degree where it approached normative averages. The  patient also showed significant difficulty adapting to adjusting to the auditory distraction part later in this measure. This pattern is clearly indicative of difficulty with visual scanning and searching and difficulty with visual distraction.  The patient was then administered the CAB CPT visual monitor measure, which is a 15 minute long visual continuous performance measure.  This measure is broken down into five 3-minute blocks of time for analysis. The patient is presented with either the color red green or blue every 2 seconds and every time the color red is presented the patient is to respond. On the first 3 min. Block of time the patient correctly identified 28 of 30 targets with 2 error of commission and 2 errors of omission. her average response time was 560 ms. This performance marked significant deterioration over the next four blocks of time.  Average response time progressively deteriorated and by the last 3 min. of this measure average response time was 832  ms, which is a significant increase over the very first 3 min. of this task. The results of this continues performance measure are consistent with any deficits with regard to sustained attention and concentration.  Overall:  The patient's performance on this broad range of attention/concentration measures and executive functioning measures are consistent with those typically found with attention deficit disorder. While it is generally very unusual to be assessing someone 71 years of age for attention deficit disorder, the patient does describe a lifelong history with these attentional deficits. While she reports that she feels like there is some worsening of these attentional issues overall she feels like they have always been present. The patient did very well on  pure reaction time measures, discriminate reaction time measures and encoding ability. She did have some difficulties with the most complex auditory discriminate reaction time  measures as well as visual encoding measures where she had to reverse the order. Both of these require some degree of multi processing abilities. While multi processing difficulties and distraction were present during this testing, the patient showed the most clear sign of attentional deficits related to sustained attentional deficits. The patient's performance on sustained discriminate reaction time measure showed a progressive and steady decline in performance as a function of time. The patient ended up quadrupling the number of errors of omission and doubling the number of errors of omission during this 15 minute task. Typically, we see a deterioration of around 100 ms over this 15 minute task regard to average response times. However, the patient's average response time increased by nearly 300 ms within the first 12 minutes of this task. She was able to maintain fairly good attentional abilities for 4 or 5 minutes but then showed a progressive and steady decline. With regard to possible cortically mediated dementias, the patient did not describe any symptoms consistent with these forms of dementia and there was nothing on this broad range of measures of attention and concentration that showed the degree and type of deficits that would have very likely been impaired even in the most early or mild stages of progressive dementia.    I do think the patient may benefit from a trial of a psychostimulant medication as long as it is warranted taken into account her other medical and age related issues. The patient did not appear to be dealing with significant depressive event during either of these visits I have with her. The patient does have a great deal of distractibility and difficulty with multi processing challenges but the most pressing issue is her inability to sustained attention as a function of time. This latter ability is one that is shown to be most benefited and helped by psychostimulant psychotropic  medications. If you have any questions regarding this evaluation please feel free to contact me any time.

## 2017-03-21 ENCOUNTER — Encounter: Payer: PPO | Attending: Psychology | Admitting: Psychology

## 2017-03-21 DIAGNOSIS — F909 Attention-deficit hyperactivity disorder, unspecified type: Secondary | ICD-10-CM | POA: Insufficient documentation

## 2017-03-21 DIAGNOSIS — F331 Major depressive disorder, recurrent, moderate: Secondary | ICD-10-CM | POA: Insufficient documentation

## 2017-03-21 DIAGNOSIS — F900 Attention-deficit hyperactivity disorder, predominantly inattentive type: Secondary | ICD-10-CM | POA: Diagnosis not present

## 2017-03-21 NOTE — Progress Notes (Signed)
Today I provided feedback of testing results.  Below is the summary from those results.  The patient's performance on this broad range of attention/concentration measures and executive functioning measures are consistent with those typically found with attention deficit disorder. While it is generally very unusual to be assessing someone 71 years of age for attention deficit disorder, the patient does describe a lifelong history with these attentional deficits. While she reports that she feels like there is some worsening of these attentional issues overall she feels like they have always been present. The patient did very well on pure reaction time measures, discriminate reaction time measures and encoding ability. She did have some difficulties with the most complex auditory discriminate reaction time measures as well as visual encoding measures where she had to reverse the order. Both of these require some degree of multi processing abilities. While multi processing difficulties and distraction were present during this testing, the patient showed the most clear sign of attentional deficits related to sustained attentional deficits. The patient's performance on sustained discriminate reaction time measure showed a progressive and steady decline in performance as a function of time. The patient ended up quadrupling the number of errors of omission and doubling the number of errors of omission during this 15 minute task. Typically, we see a deterioration of around 100 ms over this 15 minute task regard to average response times. However, the patient's average response time increased by nearly 300 ms within the first 12 minutes of this task. She was able to maintain fairly good attentional abilities for 4 or 5 minutes but then showed a progressive and steady decline. With regard to possible cortically mediated dementias, the patient did not describe any symptoms consistent with these forms of dementia and there was  nothing on this broad range of measures of attention and concentration that showed the degree and type of deficits that would have very likely been impaired even in the most early or mild stages of progressive dementia.    I do think the patient may benefit from a trial of a psychostimulant medication as long as it is warranted taken into account her other medical and age related issues. The patient did not appear to be dealing with significant depressive event during either of these visits I have with her. The patient does have a great deal of distractibility and difficulty with multi processing challenges but the most pressing issue is her inability to sustained attention as a function of time. This latter ability is one that is shown to be most benefited and helped by psychostimulant psychotropic medications. If you have any questions regarding this evaluation please feel free to contact me any time.

## 2017-03-28 DIAGNOSIS — E0800 Diabetes mellitus due to underlying condition with hyperosmolarity without nonketotic hyperglycemic-hyperosmolar coma (NKHHC): Secondary | ICD-10-CM | POA: Diagnosis not present

## 2017-03-28 DIAGNOSIS — F900 Attention-deficit hyperactivity disorder, predominantly inattentive type: Secondary | ICD-10-CM | POA: Diagnosis not present

## 2017-03-28 DIAGNOSIS — I10 Essential (primary) hypertension: Secondary | ICD-10-CM | POA: Diagnosis not present

## 2017-08-24 ENCOUNTER — Other Ambulatory Visit: Payer: Self-pay | Admitting: Family Medicine

## 2017-08-24 DIAGNOSIS — Z1231 Encounter for screening mammogram for malignant neoplasm of breast: Secondary | ICD-10-CM

## 2017-09-12 DIAGNOSIS — H04123 Dry eye syndrome of bilateral lacrimal glands: Secondary | ICD-10-CM | POA: Diagnosis not present

## 2017-09-12 DIAGNOSIS — H40033 Anatomical narrow angle, bilateral: Secondary | ICD-10-CM | POA: Diagnosis not present

## 2017-09-21 ENCOUNTER — Ambulatory Visit: Payer: Self-pay

## 2017-09-25 DIAGNOSIS — M1250 Traumatic arthropathy, unspecified site: Secondary | ICD-10-CM | POA: Diagnosis not present

## 2017-09-25 DIAGNOSIS — F909 Attention-deficit hyperactivity disorder, unspecified type: Secondary | ICD-10-CM | POA: Diagnosis not present

## 2017-10-23 ENCOUNTER — Ambulatory Visit
Admission: RE | Admit: 2017-10-23 | Discharge: 2017-10-23 | Disposition: A | Discharge status: Home / Self Care | Payer: PPO | Source: Ambulatory Visit | Attending: Family Medicine | Admitting: Family Medicine

## 2017-10-23 DIAGNOSIS — Z1231 Encounter for screening mammogram for malignant neoplasm of breast: Secondary | ICD-10-CM | POA: Diagnosis not present

## 2017-11-23 DIAGNOSIS — H2513 Age-related nuclear cataract, bilateral: Secondary | ICD-10-CM | POA: Diagnosis not present

## 2018-01-08 DIAGNOSIS — E0800 Diabetes mellitus due to underlying condition with hyperosmolarity without nonketotic hyperglycemic-hyperosmolar coma (NKHHC): Secondary | ICD-10-CM | POA: Diagnosis not present

## 2018-01-08 DIAGNOSIS — F064 Anxiety disorder due to known physiological condition: Secondary | ICD-10-CM | POA: Diagnosis not present

## 2018-01-08 DIAGNOSIS — M1250 Traumatic arthropathy, unspecified site: Secondary | ICD-10-CM | POA: Diagnosis not present

## 2018-01-08 DIAGNOSIS — F909 Attention-deficit hyperactivity disorder, unspecified type: Secondary | ICD-10-CM | POA: Diagnosis not present

## 2018-01-08 DIAGNOSIS — I10 Essential (primary) hypertension: Secondary | ICD-10-CM | POA: Diagnosis not present

## 2018-01-28 DIAGNOSIS — M1250 Traumatic arthropathy, unspecified site: Secondary | ICD-10-CM | POA: Diagnosis not present

## 2018-01-28 DIAGNOSIS — F331 Major depressive disorder, recurrent, moderate: Secondary | ICD-10-CM | POA: Diagnosis not present

## 2018-01-28 DIAGNOSIS — I10 Essential (primary) hypertension: Secondary | ICD-10-CM | POA: Diagnosis not present

## 2018-01-28 DIAGNOSIS — F909 Attention-deficit hyperactivity disorder, unspecified type: Secondary | ICD-10-CM | POA: Diagnosis not present

## 2018-03-06 DIAGNOSIS — H43812 Vitreous degeneration, left eye: Secondary | ICD-10-CM | POA: Diagnosis not present

## 2018-03-06 DIAGNOSIS — H401132 Primary open-angle glaucoma, bilateral, moderate stage: Secondary | ICD-10-CM | POA: Diagnosis not present

## 2018-03-06 DIAGNOSIS — H402212 Chronic angle-closure glaucoma, right eye, moderate stage: Secondary | ICD-10-CM | POA: Diagnosis not present

## 2018-03-06 DIAGNOSIS — H2513 Age-related nuclear cataract, bilateral: Secondary | ICD-10-CM | POA: Diagnosis not present

## 2018-03-19 DIAGNOSIS — F331 Major depressive disorder, recurrent, moderate: Secondary | ICD-10-CM | POA: Diagnosis not present

## 2018-03-19 DIAGNOSIS — F064 Anxiety disorder due to known physiological condition: Secondary | ICD-10-CM | POA: Diagnosis not present

## 2018-03-19 DIAGNOSIS — R413 Other amnesia: Secondary | ICD-10-CM | POA: Diagnosis not present

## 2018-03-26 ENCOUNTER — Encounter: Payer: Self-pay | Admitting: Psychology

## 2018-03-26 ENCOUNTER — Encounter: Payer: PPO | Attending: Psychology | Admitting: Psychology

## 2018-03-26 DIAGNOSIS — F909 Attention-deficit hyperactivity disorder, unspecified type: Secondary | ICD-10-CM | POA: Diagnosis not present

## 2018-03-26 DIAGNOSIS — F900 Attention-deficit hyperactivity disorder, predominantly inattentive type: Secondary | ICD-10-CM | POA: Diagnosis not present

## 2018-03-26 DIAGNOSIS — F331 Major depressive disorder, recurrent, moderate: Secondary | ICD-10-CM | POA: Diagnosis not present

## 2018-03-26 NOTE — Progress Notes (Signed)
Today was a follow-up appointment regarding the evaluation that was completed in May 2018.  This is a follow-up appointment.  The patient reports that she has been doing quite well and is responded well to the medications.  She does report that she is experiencing some increased and memory changes and is wondering what is going on.  We have set up a follow-up appointment in January to assess for issues of memory changes.  At this point, she reports that this is not a major issue and she feels like she is doing well on her current medications.  She reports that she is responded well to Vyvanse and her antidepressant medication.  She has reported that she is driving less.  The follow-up appointment in January is to give a more in-depth analysis of memory and cognitive functioning and assess for any progressive cognitive loss.

## 2018-05-02 DIAGNOSIS — H402212 Chronic angle-closure glaucoma, right eye, moderate stage: Secondary | ICD-10-CM | POA: Diagnosis not present

## 2018-05-30 DIAGNOSIS — E0800 Diabetes mellitus due to underlying condition with hyperosmolarity without nonketotic hyperglycemic-hyperosmolar coma (NKHHC): Secondary | ICD-10-CM | POA: Diagnosis not present

## 2018-05-30 DIAGNOSIS — I10 Essential (primary) hypertension: Secondary | ICD-10-CM | POA: Diagnosis not present

## 2018-05-30 DIAGNOSIS — R413 Other amnesia: Secondary | ICD-10-CM | POA: Diagnosis not present

## 2018-05-30 DIAGNOSIS — Z0000 Encounter for general adult medical examination without abnormal findings: Secondary | ICD-10-CM | POA: Diagnosis not present

## 2018-05-30 DIAGNOSIS — F331 Major depressive disorder, recurrent, moderate: Secondary | ICD-10-CM | POA: Diagnosis not present

## 2018-08-29 DIAGNOSIS — F0150 Vascular dementia without behavioral disturbance: Secondary | ICD-10-CM | POA: Diagnosis not present

## 2018-08-29 DIAGNOSIS — Z6828 Body mass index (BMI) 28.0-28.9, adult: Secondary | ICD-10-CM | POA: Diagnosis not present

## 2018-08-29 DIAGNOSIS — I10 Essential (primary) hypertension: Secondary | ICD-10-CM | POA: Diagnosis not present

## 2018-08-29 DIAGNOSIS — R413 Other amnesia: Secondary | ICD-10-CM | POA: Diagnosis not present

## 2018-08-29 DIAGNOSIS — M130 Polyarthritis, unspecified: Secondary | ICD-10-CM | POA: Diagnosis not present

## 2018-09-19 ENCOUNTER — Encounter: Payer: Self-pay | Admitting: Neurology

## 2018-09-19 DIAGNOSIS — H401132 Primary open-angle glaucoma, bilateral, moderate stage: Secondary | ICD-10-CM | POA: Diagnosis not present

## 2018-09-19 DIAGNOSIS — H402212 Chronic angle-closure glaucoma, right eye, moderate stage: Secondary | ICD-10-CM | POA: Diagnosis not present

## 2018-09-19 DIAGNOSIS — H2513 Age-related nuclear cataract, bilateral: Secondary | ICD-10-CM | POA: Diagnosis not present

## 2018-10-22 ENCOUNTER — Encounter: Payer: Self-pay | Admitting: Psychology

## 2018-10-22 ENCOUNTER — Encounter: Payer: PPO | Attending: Psychology | Admitting: Psychology

## 2018-10-22 DIAGNOSIS — R413 Other amnesia: Secondary | ICD-10-CM | POA: Diagnosis not present

## 2018-10-22 DIAGNOSIS — F331 Major depressive disorder, recurrent, moderate: Secondary | ICD-10-CM

## 2018-10-22 NOTE — Progress Notes (Signed)
Today was a follow-up appointment with the patient.  I initially saw her back in 2018 due to attention deficit issues.  At the time, the patient reported that her memory and other cognitive functioning was pretty good.  However, the patient reports that over the past year and a half or 2 years that there have been increasing memory difficulties as well as word finding issues and verbal fluency issues.  The patient reports that she is having more arthritic type pain and her attentional issues remain problematic but she is having more trouble keeping up with her medications and problems with her vision.  The patient reports that she has difficulty learning new information like learning how to use a new phone.  The patient does appear to be very distracted and scattered in her thinking today.  The patient's grandson was here for the appointment as well and was able to answer questions from his perspective.  The patient's grandson reports that he has seeing his grandmother start showing more more memory issues.  He reports that while she is always had some difficulty pulling up names for other people he is observing her having more naming issues and having difficulty with names of static objects as well.  The patient's grandson reports that he is seen and this worsened over the past year.  The patient's grandson reports that she is having difficulty learning new information particularly like with her phone or other new gadgets like remote controls of her TV.  The patient's grandson reports that his mother has been doing things around his grandmother's house to try to help simplify the grandmother's house like cleaning out things and helping her grandmother keep things organized.  Overall, the patient and her family are reporting increasing memory and recall issues.  The patient reports that she does not always take her Vyvanse as she was concerned that is playing a role in her increasing vision problems.  We have  set up for formal neuropsychological testing.  While the patient completed the comprehensive attention battery a year and a half ago I would like to do a full Wechsler Adult Intelligence Scale-IV as well as the Wechsler Memory Scale for older adults as well.  Wechsler Adult Intelligence Scale-IV  Wechsler Memory Scale for older adults  These test will be administered by Dr. Darol Destine with interpretation and feedback performed by myself.

## 2018-11-06 ENCOUNTER — Other Ambulatory Visit: Payer: Self-pay

## 2018-11-06 NOTE — Patient Outreach (Signed)
[facility]) Care Management  11/06/2018  Pam Knapp 11/26/1945 [id number]   Referral Date: 11/06/2018 Referral Source: HTA Concierge Referral Reason: Member memory loss  Progressing.  Need of Dementia resources   Outreach Attempt: Telephone call to [facility] per referral request.  No answer. HIPAA compliant voice message left.   Plan: RN CM will attempt again within 4 business days and send letter.   [name], RN, MSN [facility] Care Management Care Management Coordinator Direct Line [phone] Toll Free: [phone]  Fax: [phone]

## 2018-11-07 ENCOUNTER — Other Ambulatory Visit: Payer: Self-pay

## 2018-11-07 DIAGNOSIS — F900 Attention-deficit hyperactivity disorder, predominantly inattentive type: Secondary | ICD-10-CM | POA: Diagnosis not present

## 2018-11-07 DIAGNOSIS — R413 Other amnesia: Secondary | ICD-10-CM | POA: Diagnosis not present

## 2018-11-07 DIAGNOSIS — I10 Essential (primary) hypertension: Secondary | ICD-10-CM | POA: Diagnosis not present

## 2018-11-07 DIAGNOSIS — F331 Major depressive disorder, recurrent, moderate: Secondary | ICD-10-CM | POA: Diagnosis not present

## 2018-11-07 NOTE — Patient Outreach (Signed)
[facility]) Care Management  11/07/2018  Pam Knapp May 21, 1946 [id number]   Referral Date: 11/06/2018 Referral Source: HTA Concierge Referral Reason: Member memory loss  Progressing.  Need of Dementia resources   Outreach Attempt: Telephone call to [facility] per referral request.  No answer. HIPAA compliant voice message left.   Plan: RN CM will attempt again within 4 business days.  [name], RN, MSN [facility] Care Management Coordinator Direct Line [phone] Cell [phone] Toll Free: [phone]  Fax: [phone]

## 2018-11-07 NOTE — Patient Outreach (Signed)
[facility]) Care Management  11/07/2018  Pam Knapp 02-25-1946 [id number]    Referral Date:11/06/2018 Referral Source:HTA Concierge Referral Reason:Member memory loss Progressing. Need of resources and help with Advanced Directives  Spoke with daughter Pam Knapp who lives in [place] but is here trying straighten things out with her mom who has progressive memory issues. Patient lives alone but is still independent but is having problems with her thought process and daily things. Daughter states that patient has not been officially diagnosed with dementia or alzheimer's.   Patient goes to the [facility] with a friend who picks her up about 3 days per week.  Grandson lives local and checks on patient but does work.  Patient did see PCP today and home health evaluation was ordered.  Patient has several appointments arranged with neurologist and psychologist.  Daughter plans to be present for appointments and has taken FMLA to assist with her mother.  Daughter will be going back and forth from [place] to [place].  Patient also has HTN but daughter denies any problems at this time with managing blood pressure.    Discussed [facility] services with daughter.  She is agreeable to social work to assist with advanced directives and any resources for care and support for memory loss.  Plan: RN CM will refer to social work for resources and help with advanced directives.    [name], RN, MSN [place] [facility] Care Management Coordinator Direct Line [phone] Cell [phone] Toll Free: [phone]  Fax: [phone]

## 2018-11-08 ENCOUNTER — Ambulatory Visit: Payer: Self-pay

## 2018-11-13 ENCOUNTER — Other Ambulatory Visit: Payer: Self-pay

## 2018-11-13 NOTE — Patient Outreach (Signed)
[facility]) Care Management  11/13/2018  SHAJUAN MUSSO [birth year] [id number]  Referral received to contact the patients daughter [name] regarding patient resource needs. Unsuccessful outreach to Mrs. [name]. BSW left a HIPAA compliant voice message requesting a return call. BSW will mail an unsuccessful outreach letter and attempt contact within the next four business days.  Daneen Schick, BSW, CDP [facility] [phone]

## 2018-11-15 ENCOUNTER — Other Ambulatory Visit: Payer: Self-pay

## 2018-11-15 NOTE — Patient Outreach (Signed)
[facility]) Care Management  11/15/2018  CHANDELL ATTRIDGE 27-Aug-1946 [id number]  Incoming call from the patients daughter, [name]. HIPAA identifiers confirmed. [name] reports she is interested in resources for the patient to assist with transportation, socialization, and care needs. The patient has been active with [facility] in the past but it is reported the patients "uncomfortable" utilizing this resource due to past experience. The patient relies on a friend to assist with most transportation needs. [name] further reports her son (the patients grand-son) lives local and checks on the patient daily. It is reported the patient has difficulty with medication compliance. BSW discussed [facility] pharmacy services which were declined by [facility] during today's outreach.  [name] reports she is not sure if the patient is in need of interaction resources or placement at this time. She feels the patient is declining quickly and she questions her safety at times. BSW discussed options but [name] not ready to make a decision a this time. It is reported the patient will see her neurologist next week and [name] is hopeful more questions will be answered. BSW to mail information on senior centers and adult day cares.  BSW discussed this BSW transitioning into an alternate role in the coming week and the option of having a LCSW outreach [place] in the next month to follow up on patient care needs. [name] is in agreement with this plan.  Daneen Schick, BSW, CDP [facility] [phone]

## 2018-11-18 ENCOUNTER — Encounter: Payer: Self-pay | Admitting: Neurology

## 2018-11-18 ENCOUNTER — Telehealth: Payer: Self-pay | Admitting: Neurology

## 2018-11-18 ENCOUNTER — Ambulatory Visit: Payer: PPO | Admitting: Neurology

## 2018-11-18 VITALS — BP 125/70 | HR 61 | Ht 68.0 in | Wt 186.0 lb

## 2018-11-18 DIAGNOSIS — H539 Unspecified visual disturbance: Secondary | ICD-10-CM

## 2018-11-18 DIAGNOSIS — F329 Major depressive disorder, single episode, unspecified: Secondary | ICD-10-CM | POA: Insufficient documentation

## 2018-11-18 DIAGNOSIS — E559 Vitamin D deficiency, unspecified: Secondary | ICD-10-CM | POA: Diagnosis not present

## 2018-11-18 DIAGNOSIS — R41840 Attention and concentration deficit: Secondary | ICD-10-CM

## 2018-11-18 DIAGNOSIS — R413 Other amnesia: Secondary | ICD-10-CM | POA: Diagnosis not present

## 2018-11-18 DIAGNOSIS — F32A Depression, unspecified: Secondary | ICD-10-CM

## 2018-11-18 NOTE — Telephone Encounter (Signed)
Health team order sent to GI. pt is aware.

## 2018-11-18 NOTE — Progress Notes (Signed)
[facility]  PATIENT: Pam Knapp DOB: 03-22-1946  REFERRING DOCTOR OR PCP:  Pam Knapp SOURCE: patient, notes from Dr. Venetia Knapp  _________________________________   HISTORICAL  CHIEF COMPLAINT:  Chief Complaint  Patient presents with  . New Patient (Initial Visit)    RM 32 with daughter. Paper referral from Dr. Venetia Knapp for visual changes.   . Memory Loss    Past month or two having memory issues and vision problems. She sees double/blurry vision.    HISTORY OF PRESENT ILLNESS:  Had the pleasure seeing a patient, Pam Knapp, at [facility] for neurologic consultation regarding her visual disturbance.  She also has had difficulties with memory.     She is a 73 y.o. woman who notices reduced vision when she looks to one side or the other.    She hasn't noted as much change with looking up or down.  She first noted problems with vision almos a year ago but it worsened more recently.   Dr. Venetia Knapp noted an altitudinal visual defect.    She has occasionally noted diplopia.  As an example, when she saw 2 pills she commented to her daughter that it looked like there were four.   Of note, she has had cataract surgery and has glaucoma.      She has had difficulty with short term memory the past 2 years.   Initially, she was just forgetful and would misplace items but she is doing much worse now.    She was diagnosed as having attention deficit disorder many years ago.  She was placed on Vyvanse last year.    She did not think it helped and she had sleepwalking and mild visual hallucinations on it.  It was stopped recently.     She is going to be seeing psychology for formal neurocognitive testing this week.       She notes no change in her walking.  She trips occasionally.      Montreal Cognitive Assessment  11/18/2018  Visuospatial/ Executive (0/5) 2  Naming (0/3) 2  Attention: Read list of digits (0/2) 2  Attention: Read list of  letters (0/1) 1  Attention: Serial 7 subtraction starting at 100 (0/3) 1  Language: Repeat phrase (0/2) 1  Language : Fluency (0/1) 0  Abstraction (0/2) 2  Delayed Recall (0/5) 0  Orientation (0/6) 5  Total 16  Adjusted Score (based on education) 17     REVIEW OF SYSTEMS: Constitutional: No fevers, chills, sweats, or change in appetite Eyes: No visual changes, double vision, eye pain Ear, nose and throat: No hearing loss, ear pain, nasal congestion, sore throat Cardiovascular: No chest pain, palpitations Respiratory: No shortness of breath at rest or with exertion.   No wheezes GastrointestinaI: No nausea, vomiting, diarrhea, abdominal pain, fecal incontinence Genitourinary: No dysuria, urinary retention or frequency.  No nocturia. Musculoskeletal: No neck pain, back pain Integumentary: No rash, pruritus, skin lesions Neurological: as above Psychiatric: No depression at this time.  No anxiety Endocrine: No palpitations, diaphoresis, change in appetite, change in weigh or increased thirst Hematologic/Lymphatic: No anemia, purpura, petechiae. Allergic/Immunologic: No itchy/runny eyes, nasal congestion, recent allergic reactions, rashes  ALLERGIES: No Known Allergies  HOME MEDICATIONS:  Current Outpatient Medications:  .  amLODipine (NORVASC) 10 MG tablet, Take 1 tablet by mouth daily., Disp: , Rfl:  .  aspirin 325 MG tablet, Take 325 mg by mouth daily., Disp: , Rfl:  .  cetirizine (ZYRTEC) 10 MG tablet, Take 10 mg  by mouth as needed for allergies., Disp: , Rfl:  .  COMBIGAN 0.2-0.5 % ophthalmic solution, 1 drop every 12 (twelve) hours. , Disp: , Rfl:  .  doxazosin (CARDURA) 4 MG tablet, Take 4 mg by mouth daily. , Disp: , Rfl:  .  spironolactone (ALDACTONE) 25 MG tablet, Take 25 mg by mouth 2 (two) times daily. , Disp: , Rfl:   PAST MEDICAL HISTORY: Past Medical History:  Diagnosis Date  . ADHD   . Cataracts, bilateral   . Depression   . Glaucoma   . Hypertension      PAST SURGICAL HISTORY: Past Surgical History:  Procedure Laterality Date  . PARTIAL HIP ARTHROPLASTY Right   . REPLACEMENT TOTAL KNEE Left 2013    FAMILY HISTORY: History reviewed. No pertinent family history.  SOCIAL HISTORY:  Social History   Socioeconomic History  . Marital status: Widowed    Spouse name: Not on file  . Number of children: 1  . Years of education: BS  . Highest education level: Not on file  Occupational History  . Occupation: Retired  Scientific laboratory technician  . Financial resource strain: Not on file  . Food insecurity:    Worry: Not on file    Inability: Not on file  . Transportation needs:    Medical: Not on file    Non-medical: Not on file  Tobacco Use  . Smoking status: Current Every Day Smoker    Packs/day: 0.50  . Smokeless tobacco: Never Used  Substance and Sexual Activity  . Alcohol use: Yes    Comment: 3 drinks per year  . Drug use: Never  . Sexual activity: Not on file  Lifestyle  . Physical activity:    Days per week: Not on file    Minutes per session: Not on file  . Stress: Not on file  Relationships  . Social connections:    Talks on phone: Not on file    Gets together: Not on file    Attends religious service: Not on file    Active member of club or organization: Not on file    Attends meetings of clubs or organizations: Not on file    Relationship status: Not on file  . Intimate partner violence:    Fear of current or ex partner: Not on file    Emotionally abused: Not on file    Physically abused: Not on file    Forced sexual activity: Not on file  Other Topics Concern  . Not on file  Social History Narrative   Lives alone   Caffeine use: 2 coffees per day   Right handed      PHYSICAL EXAM  Vitals:   11/18/18 1023  BP: 125/70  Pulse: 61  Weight: 186 lb (84.4 kg)  Height: _0  (1.727 m)    Body mass index is 28.28 kg/m.   General: The patient is well-developed and well-nourished and in no acute  distress  Eyes:  Limited funduscopic exam shows no papiledema and normal retinal vessels.  Neck: The neck is supple, no carotid bruits are noted.  The neck is nontender.  Cardiovascular: The heart has a regular rate and rhythm with a normal S1 and S2. There were no murmurs, gallops or rubs. Lungs are clear to auscultation.  Skin: Extremities are without significant edema.  Musculoskeletal:  Back is nontender  Neurologic Exam  Mental status: The patient is alert and oriented x 2 1/2 (correct month and year but 1 date) at the  time of the examination.  She had reduced visual-spatial abilities, executive function, attention and very poor short-term memory.Marland Kitchen   Speech is normal.  Cranial nerves: Extraocular movements are full. Pupils are equal, round, and reactive to light and accomodation.  Visual fields are full.  Facial symmetry is present. There is good facial sensation to soft touch bilaterally.Facial strength is normal.  Trapezius and sternocleidomastoid strength is normal. No dysarthria is noted.  The tongue is midline, and the patient has symmetric elevation of the soft palate. No obvious hearing deficits are noted.  Motor:  Muscle bulk is normal.   Tone is normal. Strength is  5 / 5 in all 4 extremities.   Sensory: Sensory testing is intact to pinprick, soft touch and vibration sensation in all 4 extremities.  Coordination: Cerebellar testing reveals good finger-nose-finger and heel-to-shin bilaterally.  Gait and station: Station is normal.   Gait is normal. Tandem gait is mildly wide.   No retropulsion.   Romberg is negative.   Reflexes: She has a palmomental reflex.  Deep tendon reflexes are symmetric and normal in arms and 1 at knees..   Plantar responses are flexor.    DIAGNOSTIC DATA (LABS, IMAGING, TESTING) - I reviewed patient records, labs, notes, testing and imaging myself where available.  Lab Results  Component Value Date   WBC 5.5 11/17/2009   HGB 13.9 11/17/2009    HCT 40.3 11/17/2009   MCV 92.4 11/17/2009   PLT 214 11/17/2009      Component Value Date/Time   NA 138 11/17/2009 1457   K 3.9 11/17/2009 1457   CL 104 11/17/2009 1457   CO2 28 11/17/2009 1457   GLUCOSE 83 11/17/2009 1457   BUN 12 11/17/2009 1457   CREATININE 0.71 11/17/2009 1457   CALCIUM 9.5 11/17/2009 1457   PROT 7.4 11/17/2009 1457   ALBUMIN 3.9 11/17/2009 1457   AST 18 11/17/2009 1457   ALT 14 11/17/2009 1457   ALKPHOS 77 11/17/2009 1457   BILITOT 0.6 11/17/2009 1457   GFRNONAA >60 11/17/2009 1457   GFRAA  11/17/2009 1457    >60        The eGFR has been calculated using the MDRD equation. This calculation has not been validated in all clinical situations. eGFR's persistently <60 mL/min signify possible Chronic Kidney Disease.       ASSESSMENT AND PLAN  Memory loss - Plan: MR BRAIN W WO CONTRAST, Vitamin B12, Thyroid Panel With TSH, VITAMIN D 25 Hydroxy (Vit-D Deficiency, Fractures), Sedimentation rate  Visual disturbance - Plan: Vitamin B12, Thyroid Panel With TSH  Attention deficit - Plan: Thyroid Panel With TSH  Depression, unspecified depression type - Plan: Thyroid Panel With TSH  Vitamin D deficiency - Plan: VITAMIN D 25 Hydroxy (Vit-D Deficiency, Fractures)   In summary, Ms. Shifrin is a 73 year old woman with visual changes and memory loss.   On examination today, I did not note any severe visual field deficit or diplopia.  However due to her performance with optometry we need to check an MRI of the brain to determine if she has had a stroke or other disorder that may have caused the symptoms.  Furthermore, she has memory loss and appears to have a mild/moderate dementia.  Her score was 17/30 on the [facility] cognitive assessment.  We will check some blood work for reversible etiology.  The MRI will also be useful to rule out multi-infarct as an etiology and to assess whether or not hippocampal atrophy or other focal atrophy has occurred.  She reports  that she will be seen neuropsychology for neurocognitive testing later this week.  I have asked the daughter to have results forwarded to [place] as well.   It is possible that depression and attention deficit are playing some role in her memory loss but her performance today also showed difficulty with executive function and other cognitive issues.  Therefore, a neurodegenerative process is more likely.  If MRI lab evaluation is negative, we will consider adding donepezil.  She will return to see me in 3 months or sooner if there are new or worsening neurologic symptoms.  We will call with the results of the MRI  Thank you for asking Ms. Mcdowell to see [facility] for a consultation.  Please let me know if I can be of further assistance with her or other patients in the future.  Keasha Malkiewicz A. Felecia Shelling, MD, [facility] 1/76/1607, 3:71 PM Certified in Neurology, Clinical Neurophysiology, Sleep Medicine, Pain Medicine and Neuroimaging  [facility] [address] [place], [place] [address] [phone]

## 2018-11-19 ENCOUNTER — Ambulatory Visit: Payer: Self-pay

## 2018-11-19 LAB — VITAMIN D 25 HYDROXY (VIT D DEFICIENCY, FRACTURES): Vit D, 25-Hydroxy: 33.5 ng/mL (ref 30.0–100.0)

## 2018-11-19 LAB — THYROID PANEL WITH TSH
FREE THYROXINE INDEX: 2 (ref 1.2–4.9)
T3 Uptake Ratio: 28 % (ref 24–39)
T4, Total: 7 ug/dL (ref 4.5–12.0)
TSH: 3.17 u[IU]/mL (ref 0.450–4.500)

## 2018-11-19 LAB — SEDIMENTATION RATE: Sed Rate: 25 mm/hr (ref 0–40)

## 2018-11-19 LAB — VITAMIN B12: VITAMIN B 12: 652 pg/mL (ref 232–1245)

## 2018-11-20 ENCOUNTER — Ambulatory Visit
Admission: RE | Admit: 2018-11-20 | Discharge: 2018-11-20 | Disposition: A | Discharge status: Home / Self Care | Payer: PPO | Source: Ambulatory Visit | Attending: Neurology | Admitting: Neurology

## 2018-11-20 ENCOUNTER — Telehealth: Payer: Self-pay | Admitting: *Deleted

## 2018-11-20 DIAGNOSIS — R413 Other amnesia: Secondary | ICD-10-CM | POA: Diagnosis not present

## 2018-11-20 MED ORDER — GADOBENATE DIMEGLUMINE 529 MG/ML IV SOLN
17.0000 mL | Freq: Once | INTRAVENOUS | Status: AC | PRN
  Administered 2018-11-20: 17 mL via INTRAVENOUS

## 2018-11-20 NOTE — Telephone Encounter (Signed)
Called and relayed labs are normal per Dr. Felecia Shelling. She has MRI scheduled for this morning. Advised we will call once those results come back.

## 2018-11-20 NOTE — Telephone Encounter (Signed)
-----   Message from Britt Bottom, MD sent at 11/19/2018  6:40 PM EST ----- Please let the daughter or her know that the lab work was normal.

## 2018-11-22 ENCOUNTER — Encounter: Payer: Self-pay | Admitting: Psychology

## 2018-11-22 ENCOUNTER — Telehealth: Payer: Self-pay | Admitting: *Deleted

## 2018-11-22 ENCOUNTER — Encounter: Payer: PPO | Attending: Psychology | Admitting: Psychology

## 2018-11-22 DIAGNOSIS — F331 Major depressive disorder, recurrent, moderate: Secondary | ICD-10-CM | POA: Insufficient documentation

## 2018-11-22 DIAGNOSIS — R413 Other amnesia: Secondary | ICD-10-CM

## 2018-11-22 NOTE — Telephone Encounter (Signed)
Called, advised MRI brain showed age related change but nothing new per Dr. Felecia Shelling. She verbalized understanding.

## 2018-11-22 NOTE — Telephone Encounter (Signed)
-----   Message from Britt Bottom, MD sent at 11/22/2018 11:36 AM EST ----- Please let her know that the MRI of the brain showed age-related changes but nothing that looks new

## 2018-11-22 NOTE — Progress Notes (Signed)
BEHAVIOR OBSERVATIONS: Patient arrived on time to her 8:00am testing appointment and was accompanied by her daughter. She was administered the Wechsler Adult Intelligence Scale, 4th Edition and Wechsler Memory Scale, 4th Edition, Older Adult Battery. The evaluation lasted around 180 minutes. Her participation was indicative of appropriate and redirectable behaviors.  She was appropriately dressed and well groomed. She displayed an appropriate level of cooperation and motivation. Mood was euthymic overall. Affect was appropriate and congruent with mood. Thought processes appeared mostly logical and goal directed. She experienced some initial difficulty understanding test instructions and required additional prompting and repeated statements. She became confused during some subtests (e.g. WAIS-IV Digit Span Sequencing) and expressed forgetting the instructions or how to proceed. However, she persisted well overall and was able to complete all tasks assigned, with good effort noted.   Results of the WAIS-IV are as follows:  Composite Score Summary  Scale Sum of Scaled Scores Composite Score Percentile Rank 95% Conf. Interval Qualitative Description  Verbal Comprehension 25 VCI 91 27 86-97 Average  Perceptual Reasoning 16 PRI 73 4 68-81 Borderline  Working Memory 13 WMI 80 9 74-88 Low Average  Processing Speed 4 PSI 56 0.2 52-69 Extremely Low  Full Scale 58 FSIQ 72 3 68-77 Borderline  General Ability 41 GAI 80 9 76-86 Low Average   Index Level Discrepancy Comparisons  Comparison Score 1 Score 2 Difference Critical Value .05 Significant Difference Y/N Base Rate by Overall Sample  VCI - PRI 91 73 18 8.31 Y 9.5  VCI - WMI 91 80 11 9.30 Y 19.7  VCI - PSI 91 56 35 10.19 Y 1.8  PRI - PSI 73 56 17 11.00 Y 13.4  WMI - PSI 80 56 24 11.76 Y 6.7  FSIQ - GAI 72 80 -8 3.61 Y 5.7   Differences Between Subtest and Overall Mean of Subtest Scores  Subtest Subtest Scaled Score Mean Scaled Score Difference  Critical Value .05 Strength or Weakness Base Rate  Similarities 9 5.80 3.20 2.82 S 10%  Vocabulary 10 5.80 4.20 2.03 S 2-5%  Coding 1 5.80 -4.80 2.97 W 2-5%    Results of the WMS-IV (Older Adult Battery) are as follows:  Brief Cognitive Status Exam Classification  Age Years of Education Raw Score Classification Level Base Rate  72 years 5 months 16 31 Very Low 0.0    Index Score Summary  Index Sum of Scaled Scores Index Score Percentile Rank 95% Confidence Interval Qualitative Descriptor  Auditory Memory (AMI) 16 64 1 60-72 Extremely Low  Visual Memory (VMI) 3 45 <0.1 42-52 Extremely Low  Immediate Memory (IMI) 11 60 0.4 56-68 Extremely Low  Delayed Memory (DMI) 8 54 0.1 50-65 Extremely Low   Primary Subtest Scaled Score Summary  Subtest Domain Raw Score Scaled Score Percentile Rank  Logical Memory I AM 20 6 9   Logical Memory II AM 2 3 1   Verbal Paired Associates I AM 5 4 2   Verbal Paired Associates II AM 1 3 1   Visual Reproduction I VM 12 1 0.1  Visual Reproduction II VM 1 2 0.4  Symbol Span VWM 4 4 2     Auditory Memory Process Score Summary  Process Score Raw Score Scaled Score Percentile Rank Cumulative Percentage (Base Rate)  LM II Recognition 19 - - 51-75%  VPA II Recognition 19 - - 3-9%   Visual Memory Process Score Summary  Process Score Raw Score Scaled Score Percentile Rank Cumulative Percentage (Base Rate)  VR II Recognition 1 - - 3-9%

## 2018-11-25 ENCOUNTER — Encounter: Payer: Self-pay | Admitting: *Deleted

## 2018-11-25 ENCOUNTER — Other Ambulatory Visit: Payer: Self-pay | Admitting: *Deleted

## 2018-11-25 NOTE — Patient Outreach (Signed)
[facility]) Care Management  11/25/2018  MAYLEY LISH Mar 08, 1946 [id number]   CSW received a new referral on patient from Daneen Schick, social work Social worker, also with [facility], encouraging CSW to contact patient's daughter to discuss long-term care planning, as well as possible long-term care placement arrangements.  CSW made an initial attempt to try and contact patient's daughter, [name] today to perform the initial phone assessment on patient, as well as assess and assist with social work needs and services, without success.  A HIPAA compliant message was left for Mrs. [name] on voicemail.  CSW is currently awaiting a return call.  CSW will make a second outreach attempt within the next 3-4 business days, if a return call is not received from Mrs. [name] in the meantime.  CSW will also mail an Outreach Letter to patient's home requesting that patient and/or Mrs. [name] contact CSW if they are interested in receiving social work services through [place] with Scientist, clinical (histocompatibility and immunogenetics). [name], BSW, MSW, LCSW  Licensed Education officer, environmental Health System  Mailing [place] [address], [place], [place] [address] Physical Address-[address], [place], [place] [address] Toll Free Main # [phone] Fax # [phone] Cell # [phone]  Office # [phone] [email or web address]

## 2018-11-29 ENCOUNTER — Other Ambulatory Visit: Payer: Self-pay | Admitting: *Deleted

## 2018-11-29 NOTE — Patient Outreach (Signed)
[facility]) Care Management  11/29/2018  MAUDENE STOTLER [birth year] [id number]   CSW made a second attempt to try and contact patient's daughter, [name] today to perform the phone assessment on patient, as well as assess and assist with social work needs and services, without success.  A HIPAA compliant message was left for Mrs. [name] on voicemail.  CSW continues to await a return call.  CSW will make a third and final outreach attempt within the next 3-4 business days, if a return call is not received from Mrs. [name] in the meantime.  CSW will then proceed with case closure if a return call is not received from Mrs. [name] with a total of 10 business days, as required number of phone attempts will have been made and outreach letter mailed.   [name], BSW, MSW, LCSW  Licensed Education officer, environmental Health System  Mailing [place] [address] [place], [place] [address] Physical Address[address], [place], [place] [address] Toll Free Main # [phone] Fax # [phone] Cell # [phone]  Office # [phone] [email or web address]

## 2018-12-03 ENCOUNTER — Encounter: Payer: Self-pay | Admitting: *Deleted

## 2018-12-03 ENCOUNTER — Other Ambulatory Visit: Payer: Self-pay | Admitting: *Deleted

## 2018-12-03 NOTE — Patient Outreach (Signed)
[facility]) Care Management  12/03/2018  CRESSIE BETZLER 08-26-1946 [id number]   CSW received a return call from patient's daughter, [name] today, in response to the HIPAA compliant message left for Mrs. [name] by CSW on voicemail on Friday, November 29, 2018.  CSW was able to perform the initial phone assessment on patient, as well as assess and assist with social work needs and services.  CSW introduced self, explained role and types of services provided through [place] [facility] ([place] [facility]).  CSW further explained to Mrs. [name] that CSW works with Daneen Schick, social work Social worker, also with [place] [facility]. CSW then explained the reason for the call, indicating that Mrs. Humble thought that Mrs. [name] would benefit from social work services and resources to assist with possible long-term care placement for patient in an assisted living facility, as well as provide patient with assistance in arranging in-home caregiver services.  CSW obtained two HIPAA compliant identifiers from Mrs. [name], which included patient's name and date of birth.  Mrs. [name] admitted that she definitely needs to arrange for patient to receive some in-home care aide services, as patient is now requiring assistance with activities of daily living.  Mrs. [name] went on to say that patient's grandson, Fayrene Helper lives nearby and checks in with patient on a daily basis.  Mr. Francee Nodal is able to assist patient with light-housekeeping duties, grocery shopping, meal preparation, medication administration/management and transportation.  Mrs. [name] further reported that patient has a close friend that is also available to transport patient to and from her physician appointments.  Patient is eligible to receive transportation services through [employer]), but does not like to have to rely on them unless no other means of transportation are  available to her.  CSW spoke with Mrs. [name] about transportation services through [employer] with [employer] [facility], explaining that this would be another Building surveyor for patient, providing Mrs. [name] with the contact information.  CSW inquired as to whether or not Mrs. [name] thought that patient would benefit from long-term care placement into an assisted living facility.  Mrs. [name] admitted that she really does not know what patient will need at this time, agreeing to have CSW mail her the following information to her home address [address] [place], [place]):   List of Assisted Living Facilities in [place] for Ongoing [place] agreed to get this information in the mail to Mrs. [name] today, along with an Garment/textile technologist ([place] documents), as Mrs. [name] reported that she would really like to try and get these documents in place, "before it is too late".  CSW explained to Mrs. [name] that [place] will follow-up with her next week, on Tuesday, December 10, 2018, around noon to ensure that she received the packet of resource information mailed to her home, as well as answer any questions that she may have at that time.  [name], BSW, MSW, LCSW  Licensed Education officer, environmental Health System  Mailing [place] [address] [place], [place] [address] Physical Address-[address], [place], [place] [address] Toll Free Main # [phone] Fax # [phone] Cell # [phone]  Office # [phone] [email or web address]

## 2018-12-04 ENCOUNTER — Ambulatory Visit: Payer: Self-pay | Admitting: *Deleted

## 2018-12-06 ENCOUNTER — Ambulatory Visit (INDEPENDENT_AMBULATORY_CARE_PROVIDER_SITE_OTHER): Payer: PPO | Admitting: Family

## 2018-12-06 ENCOUNTER — Encounter: Payer: Self-pay | Admitting: Family

## 2018-12-06 VITALS — BP 138/80 | HR 63 | Temp 97.6°F | Ht 68.0 in | Wt 185.0 lb

## 2018-12-06 DIAGNOSIS — I10 Essential (primary) hypertension: Secondary | ICD-10-CM | POA: Diagnosis not present

## 2018-12-06 DIAGNOSIS — J302 Other seasonal allergic rhinitis: Secondary | ICD-10-CM | POA: Diagnosis not present

## 2018-12-06 DIAGNOSIS — Z23 Encounter for immunization: Secondary | ICD-10-CM

## 2018-12-06 DIAGNOSIS — R41840 Attention and concentration deficit: Secondary | ICD-10-CM

## 2018-12-06 DIAGNOSIS — Z6828 Body mass index (BMI) 28.0-28.9, adult: Secondary | ICD-10-CM | POA: Diagnosis not present

## 2018-12-06 DIAGNOSIS — H409 Unspecified glaucoma: Secondary | ICD-10-CM

## 2018-12-06 DIAGNOSIS — M353 Polymyalgia rheumatica: Secondary | ICD-10-CM | POA: Diagnosis not present

## 2018-12-06 MED ORDER — TETANUS-DIPHTH-ACELL PERTUSSIS 5-2.5-18.5 LF-MCG/0.5 IM SUSP: 0.5000 mL | Freq: Once | INTRAMUSCULAR | 0 refills | Status: AC

## 2018-12-06 NOTE — Progress Notes (Signed)
Provider: [name]   Nikeia Henkes, [name], NP  Patient Care Team: Crystol Walpole, [name], NP as PCP - General (Family Medicine) Saporito, Maree Erie, LCSW as [place] [employer] [name], MD as Consulting Physician (Ophthalmology) Felecia Shelling, Nanine Means, MD (Neurology)  Extended Emergency Contact Information Primary Emergency Contact: [name] Mobile Phone: [phone] Relation: Daughter Secondary Emergency Contact: [name],[name]  [place] Phone: [phone] Relation: Grandson   Goals of care: Advanced Directive information Advanced Directives 12/06/2018  Does Patient Have a Medical Advance Directive? No  Would patient like information on creating a medical advance directive? No - Patient declined     Chief Complaint  Patient presents with  . Establish Care    New Patient patient states   . Medication Management    Patient states she has attention deficit and would like an alternative to prior treatment   . Quality Metric Gaps    Tetanus Vaccine sent to pharmacy     HPI:  Pt is a 73 y.o. female seen today for medical management of chronic diseases.she denies any acute issues during visit.she states has been a patient of Dr.Bland Veita but transferred here for Geriatric "I'm getting too old". She states lives alone at home but her grandson who lives nearby checks on her daily and takes her for her appointments.Also states can use [employer] bus whenever the grandson is not able to drive her to appointments.she manages her medication and activity of daily living.she denies any recent fall episodes or weight changes.   Hypertension - on spironolactone 25 mg tablet twice daily,cardura 4 mg tablet daily and amlodipine 10 mg tablet daily.on ASA for chest pain prophylaxis.she denies any headache,dizziness or chest pain.  ADHD - she states used to take medication ( could not recall name of medication) but had lots of side effects.she  request another medication but declines states if it has side effects she doesn't want any medication.on Neuro chart review patient has been on Vyanase but recent discontinued.seen by [facility] 11/18/2018 by Dr.Sater Delfino Lovett.Awaiting Psychology Neurocognitive testing.    Polymyalgia rheumatica/Arthritis  - reports left hip pain 3/10 on scale.Hx of right knee replacement. Takes Tylenol OTC as needed.No recent fall episodes.   Glaucoma - on Combigan eye drops every 12 hours.denies any worsening of vision.Hx bilateral cataract   Seasonal allergies - states symptoms stable on cetirizine 10 mg tablet as needed.  Depression - Denies any worsening symptoms.off antidepressant.   Health Maintenance - Due for Tdap vaccine.States does not exercise on regular basis due to left hip pain.     Past Medical History:  Diagnosis Date  . ADHD   . Arthritis   . Cataracts, bilateral   . Depression   . Glaucoma   . Hypertension   . Polymyalgia rheumatica (HCC)    Past Surgical History:  Procedure Laterality Date  . PARTIAL HIP ARTHROPLASTY Right 2011  . REPLACEMENT TOTAL KNEE Left 2013   Dr Jodell Cipro    No Known Allergies  Allergies as of 12/06/2018   No Known Allergies     Medication List       Accurate as of December 06, 2018 11:45 AM. Always use your most recent med list.        amLODipine 10 MG tablet Commonly known as:  NORVASC Take 1 tablet by mouth daily.   aspirin 325 MG tablet Take 325 mg by mouth as needed.   cetirizine 10 MG tablet Commonly known as:  ZYRTEC Take 10 mg by mouth as needed for allergies.   COMBIGAN 0.2-0.5 % ophthalmic solution Generic drug:  brimonidine-timolol 1 drop every 12 (twelve) hours.   doxazosin 4 MG tablet Commonly known as:  CARDURA Take 4 mg by mouth daily.   spironolactone 25 MG tablet Commonly known as:  ALDACTONE Take 25 mg by mouth 2 (two) times daily.   Tdap 5-2.5-18.5 LF-MCG/0.5 injection Commonly known as:   BOOSTRIX Inject 0.5 mLs into the muscle once for 1 dose.       Review of Systems  Constitutional: Negative for appetite change, chills, fatigue and fever.  HENT: Negative for congestion, hearing loss, rhinorrhea, sinus pressure, sinus pain, sneezing and sore throat.   Eyes: Positive for visual disturbance. Negative for pain, discharge, redness and itching.       Wears eye glasses   Respiratory: Negative for cough, chest tightness, shortness of breath and wheezing.   Cardiovascular: Negative for chest pain, palpitations and leg swelling.  Gastrointestinal: Negative for abdominal distention, abdominal pain, constipation, diarrhea, nausea and vomiting.  Endocrine: Negative for cold intolerance, heat intolerance, polydipsia, polyphagia and polyuria.  Genitourinary: Negative for dysuria, flank pain, frequency and urgency.  Musculoskeletal: Positive for arthralgias. Negative for gait problem.       Hx right knee and left hip replacement   Skin: Negative for color change, pallor and rash.  Neurological: Negative for dizziness, light-headedness, numbness and headaches.  Hematological: Does not bruise/bleed easily.  Psychiatric/Behavioral: Negative for agitation, confusion and sleep disturbance. The patient is not nervous/anxious.        Forgetful     Immunization History  Administered Date(s) Administered  . Influenza, High Dose Seasonal PF 07/29/2017, 08/30/2018  . Zoster Recombinat (Shingrix) 10/22/2018   Pertinent  Health Maintenance Due  Topic Date Due  . COLONOSCOPY  05/31/1995  . DEXA SCAN  05/30/2010  . PNA vac Low Risk Adult (1 of 2 - PCV13) 05/30/2010  . MAMMOGRAM  10/24/2019  . INFLUENZA VACCINE  Completed   Fall Risk  12/06/2018 12/03/2018  Falls in the past year? 1 1  Number falls in past yr: 0 0  Injury with Fall? 0 0  Risk for fall due to : - Impaired balance/gait;Impaired mobility;Impaired vision;Mental status change  Follow up - Education provided;Falls prevention  discussed    Vitals:   12/06/18 1106  BP: 138/80  Pulse: 63  Temp: 97.6 F (36.4 C)  TempSrc: Oral  SpO2: 98%  Weight: 185 lb (83.9 kg)  Height: '5\' 8"'$  (1.727 m)   Body mass index is 28.13 kg/m. Physical Exam Vitals signs reviewed.  Constitutional:      General: She is not in acute distress.    Appearance: She is overweight.  HENT:     Head: Normocephalic.     Right Ear: Tympanic membrane, ear canal and external ear normal. There is no impacted cerumen.     Left Ear: Tympanic membrane, ear canal and external ear normal. There is no impacted cerumen.     Nose: Nose normal. No congestion or rhinorrhea.     Mouth/Throat:     Mouth: Mucous membranes are moist.     Pharynx: Oropharynx is clear. No oropharyngeal exudate or posterior oropharyngeal erythema.  Eyes:     General: No scleral icterus.       Right eye: No discharge.        Left eye: No discharge.     Extraocular Movements: Extraocular movements intact.     Conjunctiva/sclera: Conjunctivae normal.  Pupils: Pupils are equal, round, and reactive to light.  Neck:     Musculoskeletal: Normal range of motion. No neck rigidity or muscular tenderness.     Vascular: No carotid bruit.  Cardiovascular:     Rate and Rhythm: Normal rate and regular rhythm.     Pulses: Normal pulses.     Heart sounds: Normal heart sounds. No murmur. No friction rub. No gallop.   Pulmonary:     Effort: Pulmonary effort is normal. No respiratory distress.     Breath sounds: Normal breath sounds. No wheezing, rhonchi or rales.  Chest:     Chest wall: No tenderness.  Abdominal:     General: Bowel sounds are normal. There is no distension.     Palpations: Abdomen is soft. There is no mass.     Tenderness: There is no abdominal tenderness. There is no right CVA tenderness, left CVA tenderness, guarding or rebound.  Musculoskeletal: Normal range of motion.        General: No swelling or tenderness.     Right lower leg: No edema.     Left lower  leg: No edema.  Lymphadenopathy:     Cervical: No cervical adenopathy.  Skin:    General: Skin is warm and dry.     Coloration: Skin is not pale.     Findings: No erythema, lesion or rash.  Neurological:     Mental Status: She is alert and oriented to person, place, and time.     Cranial Nerves: No cranial nerve deficit.     Sensory: No sensory deficit.     Motor: No weakness.     Coordination: Coordination normal.     Gait: Gait normal.  Psychiatric:        Mood and Affect: Mood normal.        Speech: Speech normal.        Behavior: Behavior normal.        Thought Content: Thought content normal.        Cognition and Memory: She exhibits impaired recent memory.        Judgment: Judgment normal.    Labs reviewed: No results for input(s): NA, K, CL, CO2, GLUCOSE, BUN, CREATININE, CALCIUM, MG, PHOS in the last 8760 hours. No results for input(s): AST, ALT, ALKPHOS, BILITOT, PROT, ALBUMIN in the last 8760 hours. No results for input(s): WBC, NEUTROABS, HGB, HCT, MCV, PLT in the last 8760 hours. Lab Results  Component Value Date   TSH 3.170 11/18/2018   No results found for: HGBA1C No results found for: CHOL, HDL, LDLCALC, LDLDIRECT, TRIG, CHOLHDL  Significant Diagnostic Results in last 30 days:  Mr Jeri Cos Wo Contrast  Result Date: 11/21/2018  [facility] [address] [place], [place] [address] [phone] NEUROIMAGING REPORT STUDY DATE: 11/20/2018 PATIENT NAME: ARELIZ ROTHMAN DOB: April 22, 1946 MRN: [id number] ORDERING CLINICIAN: Dr Felecia Shelling CLINICAL HISTORY:  58 year patient with memory loss COMPARISON FILMS: none EXAM: MRI Brain w/wo TECHNIQUE: MRI of the brain with and without contrast was obtained utilizing 5 mm axial slices with T1, T2, T2 flair, T2 star gradient echo and diffusion weighted views.  T1 sagittal, T2 coronal and postcontrast views in the axial and coronal plane were obtained. CONTRAST: 17 ml multihance IMAGING SITE: [facility]  FINDINGS: The brain parenchyma shows mild periventricular and subcortical scattered white matter hyperintensities compatible with age-related changes of chronic microvascular ischemia.  No structural lesion, tumor or infarcts are noted.  There are mild  changes of chronic paranasal sinusitis.  There is a small incidental 3 mm pineal region  Benign cyst noted.No abnormal lesions are seen on diffusion-weighted views to suggest acute ischemia. The cortical sulci, fissures and cisterns are normal in size and appearance. Lateral, third and fourth ventricle are normal in size and appearance. No extra-axial fluid collections are seen. No evidence of mass effect or midline shift.  No abnormal lesions are seen on post contrast views.  On sagittal views the posterior fossa, pituitary gland and corpus callosum are unremarkable. No evidence of intracranial hemorrhage on gradient-echo views. The orbits and their contents, paranasal sinuses and calvarium are unremarkable.  Intracranial flow voids are present.   Unremarkable MRI scan of the brain with and without contrast showing only minor age-related changes of chronic microvascular ischemia. INTERPRETING PHYSICIAN: Antony Contras, MD Certified in  Neuroimaging by [employer] and [employer] for Neurological Subspecialities    Assessment/Plan  1. Essential hypertension B/p stable.continue on spironolactone 25 mg tablet twice daily,cardura 4 mg tablet daily and amlodipine 10 mg tablet daily.on ASA for chest pain prophylaxis. - CBC with Differential/Platelet - CMP with eGFR([facility])  2. Need for Tdap vaccination - Tdap ([place]) 5-2.5-18.5 LF-MCG/0.5 injection; Inject 0.5 mLs into the muscle once for 1 dose.  Dispense: 0.5 mL; Refill: 0  3. Body mass index 28.0-28.9, adult Recommended low carbohydrates,low saturated fats and high vegetable diet. - Hemoglobin A1c - Lipid panel  4. Attention deficit Off medication.no new medication  desired.continue to follow up with Neuro.  5. Polymyalgia rheumatica (HCC) Continue on OTC Tylenol for pain.   6. Glaucoma of both eyes, unspecified glaucoma type Continue to follow up with Ophthalmology.   7. Seasonal allergies Stable on cetirizine 10 mg tablet as needed.Recomend taking daily if symptoms worsen.   Family/ staff Communication: Reviewed plan of care with patient.  Labs/tests ordered:  - CBC with Differential/Platelet - CMP with eGFR([facility]) - Hemoglobin A1c - Lipid panel  Romin Divita C Ephram Kornegay, NP

## 2018-12-07 LAB — HEMOGLOBIN A1C
Hgb A1c MFr Bld: 5.6 % of total Hgb (ref <5.7)
Mean Plasma Glucose: 114 (calc)
eAG (mmol/L): 6.3 (calc)

## 2018-12-10 ENCOUNTER — Other Ambulatory Visit: Payer: Self-pay | Admitting: *Deleted

## 2018-12-10 ENCOUNTER — Encounter: Payer: Self-pay | Admitting: *Deleted

## 2018-12-10 ENCOUNTER — Other Ambulatory Visit: Payer: PPO

## 2018-12-10 DIAGNOSIS — I10 Essential (primary) hypertension: Secondary | ICD-10-CM | POA: Diagnosis not present

## 2018-12-10 DIAGNOSIS — Z6828 Body mass index (BMI) 28.0-28.9, adult: Secondary | ICD-10-CM | POA: Diagnosis not present

## 2018-12-10 LAB — COMPLETE METABOLIC PANEL WITH GFR
AG Ratio: 1.2 (calc) (ref 1.0–2.5)
ALT: 10 U/L (ref 6–29)
AST: 15 U/L (ref 10–35)
Albumin: 3.9 g/dL (ref 3.6–5.1)
Alkaline phosphatase (APISO): 63 U/L (ref 37–153)
BUN: 15 mg/dL (ref 7–25)
CO2: 28 mmol/L (ref 20–32)
CREATININE: 0.74 mg/dL (ref 0.60–0.93)
Calcium: 9.8 mg/dL (ref 8.6–10.4)
Chloride: 104 mmol/L (ref 98–110)
GFR, EST NON AFRICAN AMERICAN: 80 mL/min/{1.73_m2} (ref >=60)
GFR, Est African American: 93 mL/min/{1.73_m2} (ref >=60)
Globulin: 3.2 g/dL (calc) (ref 1.9–3.7)
Glucose, Bld: 97 mg/dL (ref 65–99)
Potassium: 4.2 mmol/L (ref 3.5–5.3)
Sodium: 139 mmol/L (ref 135–146)
Total Bilirubin: 0.5 mg/dL (ref 0.2–1.2)
Total Protein: 7.1 g/dL (ref 6.1–8.1)

## 2018-12-10 LAB — CBC WITH DIFFERENTIAL/PLATELET
Absolute Monocytes: 432 cells/uL (ref 200–950)
BASOS PCT: 0.6 %
Basophils Absolute: 19 cells/uL (ref 0–200)
EOS ABS: 112 {cells}/uL (ref 15–500)
Eosinophils Relative: 3.5 %
HCT: 36.8 % (ref 35.0–45.0)
Hemoglobin: 12.1 g/dL (ref 11.7–15.5)
Lymphs Abs: 1178 cells/uL (ref 850–3900)
MCH: 30.3 pg (ref 27.0–33.0)
MCHC: 32.9 g/dL (ref 32.0–36.0)
MCV: 92 fL (ref 80.0–100.0)
MPV: 10.4 fL (ref 7.5–12.5)
Monocytes Relative: 13.5 %
Neutro Abs: 1459 cells/uL — ABNORMAL LOW (ref 1500–7800)
Neutrophils Relative %: 45.6 %
Platelets: 166 10*3/uL (ref 140–400)
RBC: 4 10*6/uL (ref 3.80–5.10)
RDW: 12.3 % (ref 11.0–15.0)
Total Lymphocyte: 36.8 %
WBC: 3.2 10*3/uL — ABNORMAL LOW (ref 3.8–10.8)

## 2018-12-10 LAB — LIPID PANEL
Cholesterol: 160 mg/dL (ref <200)
HDL: 59 mg/dL (ref >=50)
LDL Cholesterol (Calc): 86 mg/dL (calc)
Non-HDL Cholesterol (Calc): 101 mg/dL (calc) (ref <130)
Total CHOL/HDL Ratio: 2.7 (calc) (ref <5.0)
Triglycerides: 61 mg/dL (ref <150)

## 2018-12-10 NOTE — Patient Outreach (Signed)
[place] [facility]) Care Management  12/10/2018  Pam Knapp 02/23/1946 [id number]   CSW was able to make contact with patient's daughter, [name] today to follow-up regarding social work services and resources for patient, as well as to ensure that she received the packet of resource information mailed to her home by CSW.  Mrs. [name] admitted that she did receive the packet, but denied having any questions about the information provided.  CSW agreed to assist Mrs. [name] with making referrals to various agencies for assistance; however, Mrs. [name] declined.  Mrs. [name] indicated that she plans to make some calls from the List of [place] provided, as well as utilize the List of [place] for Ongoing Illnesses and [place] and Respite Agencies to see if patient is eligible for financial assistance for in-home care services.  Mrs. [name] admitted that she is not ready to pursue long-term care placement arrangements for patient, wanting to save the List of Assisted Living Facilities in [facility] and the List of [place] Facilities for future reference.  CSW will perform a case closure on patient, as all goals of treatment have been met from social work standpoint and no additional social work needs have been identified at this time.  CSW will fax an update to patient's Primary Care Physician, Dr. Webb Silversmith [name] to ensure that they are aware of CSW's involvement with patient's plan of care.  CSW was able to ensure that Mrs. [name] has the correct contact information for CSW, encouraging her to contact CSW directly if additional social work needs arise for patient in the near future.  Mrs. [name] voiced understanding and was agreeable to this plan.  [name], BSW, MSW, LCSW  Licensed Education officer, environmental Health System  Mailing [place] [address] [place], [place]  [address] Physical Address-[address], [place], [place] [address] Toll Free Main # [phone] Fax # [phone] Cell # [phone]  Office # [phone] [email or web address]

## 2018-12-12 ENCOUNTER — Ambulatory Visit: Payer: Self-pay | Admitting: *Deleted

## 2018-12-12 ENCOUNTER — Other Ambulatory Visit: Payer: Self-pay

## 2018-12-12 DIAGNOSIS — I10 Essential (primary) hypertension: Secondary | ICD-10-CM

## 2018-12-12 DIAGNOSIS — Z6828 Body mass index (BMI) 28.0-28.9, adult: Secondary | ICD-10-CM

## 2018-12-17 ENCOUNTER — Encounter: Payer: Self-pay | Admitting: Psychology

## 2018-12-17 ENCOUNTER — Encounter: Payer: PPO | Attending: Psychology | Admitting: Psychology

## 2018-12-17 DIAGNOSIS — F331 Major depressive disorder, recurrent, moderate: Secondary | ICD-10-CM | POA: Insufficient documentation

## 2018-12-17 DIAGNOSIS — R413 Other amnesia: Secondary | ICD-10-CM | POA: Insufficient documentation

## 2018-12-17 DIAGNOSIS — G301 Alzheimer's disease with late onset: Secondary | ICD-10-CM

## 2018-12-17 DIAGNOSIS — F0280 Dementia in other diseases classified elsewhere without behavioral disturbance: Secondary | ICD-10-CM

## 2018-12-17 NOTE — Progress Notes (Signed)
Neuropsychological Report    Patient:  Pam Knapp   DOB: Mar 13, 1946  MR Number: [id number]  Location: [place] [facility] PHYSICAL MEDICINE AND REHABILITATION [place], [place] [id number] [address] [place] [address] Dept: [phone]  Start: 8 AM End: 9 AM  Provider/Observer:     Edgardo Roys PsyD  Chief Complaint:      Chief Complaint  Patient presents with  . Memory Loss    Reason For Service:     Pam Knapp is a 73 year old female who I initially saw back in 2018 due to attentional issues.  At the time, the patient reported that her memory and other cognitive functioning was pretty good.  However, the patient reports that over the past year and a half or 2 years that there have been increasing memory difficulties as well as word finding issues and verbal fluency issues.  The patient reports that she is having more arthritic type pain and her attentional issues remain problematic but she is having more trouble keeping up with her medications and problems with her vision.  The patient reports that she has difficulty learning new information like learning how to use a new phone.  The patient does appear to be very distracted and scattered in her thinking today.  The patient's grandson was here for the appointment as well and was able to answer questions from his perspective.  The patient's grandson reports that he has seeing his grandmother start showing more more memory issues.  He reports that while she is always had some difficulty pulling up names for other people he is observing her having more naming issues and having difficulty with names of static objects as well.  The patient's grandson reports that he is seen and this worsened over the past year.  The patient's grandson reports that she is having difficulty learning new information particularly like with her phone or other new gadgets like remote  controls of her TV.  The patient's grandson reports that his mother has been doing things around his grandmother's house to try to help simplify the grandmother's house like cleaning out things and helping her grandmother keep things organized.  Overall, the patient and her family are reporting increasing memory and recall issues.  The patient reports that she does not always take her Vyvanse as she was concerned that is playing a role in her increasing vision problems.  We have set up for formal neuropsychological testing.  While the patient completed the comprehensive attention battery a year and a half ago I would like to do a full Wechsler Adult Intelligence Scale-IV as well as the Wechsler Memory Scale for older adults as well.  Testing Administered:  The patient was administered the Wechsler Adult Intelligence Scale-IV as well as the Wechsler Memory Scale for older adults.  Participation Level:   Active  Participation Quality:  Appropriate      Behavioral Observation:  Well Groomed, Alert, and Appropriate.  I will enclose the behavioral observations made by Dr. Feliberto Knapp during his administration of these 2 measures on 11/20/2018.  BEHAVIOR OBSERVATIONS: Patient arrived on time to her 8:00am testing appointment and was accompanied by her daughter. She was administered the Wechsler Adult Intelligence Scale, 4th Edition and Wechsler Memory Scale, 4th Edition, Older Adult Battery. The evaluation lasted around 180 minutes. Herparticipation was indicative of appropriate and redirectablebehaviors. She was appropriately dressed and well groomed. Shedisplayed an appropriatelevel of cooperation and motivation. Mood was euthymic overall.  Affect was appropriate and congruent with mood. Thought processes appeared mostly logical and goal directed. She experienced some initial difficulty understanding test instructions and required additional prompting and repeated statements. She became confused during  some subtests (e.g. WAIS-IV Digit Span Sequencing) and expressed forgetting the instructions or how to proceed. However, she persisted well overall and was able to complete all tasks assigned, with good effort noted.   Test Results:   Initially, the patient was administered the Wechsler Adult Intelligence Scale-IV.  Below are the composite scores for this battery of test.  Composite Score Summary  Scale Sum of Scaled Scores Composite Score Percentile Rank 95% Conf. Interval Qualitative Description  Verbal Comprehension 25 VCI 91 27 86-97 Average  Perceptual Reasoning 16 PRI 73 4 68-81 Borderline  Working Memory 13 WMI 80 9 74-88 Low Average  Processing Speed 4 PSI 56 0.2 52-69 Extremely Low  Full Scale 58 FSIQ 72 3 68-77 Borderline  General Ability 41 GAI 80 9 76-86 Low Average   The patient produced a full scale IQ score which is a broad measure encapsulating all of the underlying cognitive domains assessed as a full scale IQ score of 72.  This score falls in the 3rd percentile relative to her age-matched comparison group and is in the borderline range of functioning.  This is a significantly impaired score especially considering her educational and occupational history.  The patient produced a verbal comprehension index score of 91 which fell at the 27th percentile and is in the average range.  This measure is likely most indicative of her premorbid functioning although is likely even below that and the patient very likely functions in the average to high average range during her life.  The patient produced a perceptual reasoning index score of 73 which falls at the 4th percentile and is in the borderline range of functioning.  Working memory index score was an 80 which falls at the 9th percentile and is in the low average range.  The patient had her most impaired performance on the processing speed index where she produced a composite score of 56 which falls below the 1st percentile and is in the  extremely low range of performance.   Verbal Comprehension Subtests Summary  Subtest Raw Score Scaled Score Percentile Rank Reference Group Scaled Score SEM  Similarities 22 9 37 9 0.95  Vocabulary 35 10 50 10 0.67  Information 8 6 9 7  0.73  (Comprehension) 22 10 50 9 1.27    The patient produced a verbal comprehension score of 91 which falls in the 27th percentile and is in the average range.  Individual items making up the subtest show that all of her scores were in the  average range including her verbal reasoning abilities, her vocabulary, as well as her social judgment and comprehension.  The 1 exception was a low measure with regard to her general fund of  information although this measure likely is more related to her range of life experiences post education.   Perceptual Reasoning Subtests Summary  Subtest Raw Score Scaled Score Percentile Rank Reference Group Scaled Score SEM  Block Design 12 4 2 3  0.99  Matrix Reasoning 6 6 9 3  0.90  Visual Puzzles 6 6 9 4  0.99  (Picture Completion) 2 3 1 1  0.99    The patient produced a perceptual reasoning index score of 73 which falls at the 4th percentile and is in the borderline range of functioning.  This is indicative of mild to moderate  range of cognitive  deficits.  The patient had mildly impaired range with regard to her visual reasoning and problem-solving measures.  More significant deficits were noted with regard to her visual-spatial and visual  organizational abilities as well as her ability to identify visual anomalies within a gestalt.    Working Doctor, general practice Raw Score Scaled Score Percentile Rank Reference Group Scaled Score SEM  Digit Span 17 6 9 4  0.73  Arithmetic 9 7 16 6  1.20    The patient produced a working memory index score of 80 which falls at the 9th percentile and is in the low average range.  Individual items making up the score show that her auditory encoding and  attention while performing  cognitive tasks such as arithmetic were in the low average and mildly impaired range.  On testing approximately 2-1/2 years ago the patient's encoding abilities were much better  and this does appear to be indicative of a significant decline over the past 2-1/2 years.   Processing Speed Subtests Summary  Subtest Raw Score Scaled Score Percentile Rank Reference Group Scaled Score SEM  Symbol Search 7 3 1 1  1.12  Coding 5 1 0.1 1 1.12   The patient produced a processing speed index score of 56 which falls at the 0.2 percentile rank and is in the extremely low range.  This was clearly her lowest area of cognitive functioning.  Individual subtest making up the score shows that the patient had significant profound deficits with regard to visual scanning, visual searching and overall speed of mental operations.  The patient has had some significant visual changes that were identified by her neurologist which may explain some of these visual scanning deficits but would not explain the level of slowed information processing speed.   The patient was then administered the Wechsler Adult Intelligence Scale-IV for older adults.  2 analyze the degree of memory deficits we utilize the patient's verbal comprehension index score of 91 as a comparison to assess for developing deficits.  This is her most preserved cognitive area and is also one that is least likely to change over time and is often the most well preserved area of functioning.  It is likely, however, that this is a underestimation of her predicted level of function as the patient had done quite well with regard to overall life functioning prior to the development of these difficulties over the past 2-1/2 or 3 years.  ABILITY-MEMORY ANALYSIS  Ability Score:  VCI: 91 Date of Testing:  WAIS-IV; WMS-IV 2018/11/22  Predicted Difference Method   Index Predicted WMS-IV Index Score Actual WMS-IV Index Score Difference Critical Value   Significant Difference Y/N Base Rate  Auditory Memory 95 64 31 10.41 Y 1%  Visual Memory 96 45 51 7.35 Y <1%  Immediate Memory 95 60 35 9.69 Y <1%  Delayed Memory 95 54 41 12.22 Y <1%  Statistical significance (critical value) at the .01 level.   Utilizing the verbal comprehension index score as a predictive measure for the level expected with regard to her memory test the patient showed significant profound deficits in all areas of memory assessed.  The patient's initial immediate memory produced an index score of 60 which falls at the 0.4 percentile ranks and is in the extremely low range of functioning relative to her normative comparisons.  When breaking down immediate versus visual memory, the patient produced an auditory memory index score of 64 which falls at the 1st percentile and is in  the extremely low range of functioning.  The patient produced an initial visual memory index score of 45 which falls below the 0.1 percentile rank and is in the extremely low range of functioning.  The patient produced a delayed memory index score of 54 which falls at the 0.1 percentile rank and is also in the extremely low range of functioning.  Overall, the patient is showing significant and profound memory deficits and is well below both predicted levels based on her current verbal comprehension index score as well as even greater deficits relative to predictions based on her past education and occupational history.  The memory deficits were in all domains although the visual memory deficits may be worsened by her current visual deficits that would not explain the degree of impairment noted today based solely on her current visual deficits.  Summary of Results:   Overall, the results of the current neuropsychological evaluation do show a significant deterioration in the patient's overall cognitive functioning from 2-1/2 years ago.  While in 2018 the patient was initially describing problems and worsening  issues with regard to her attention and concentration and deficits were seen with regard to multi processing ability and sustaining attention at the time, the patient was doing well and some on the other attentional measures.  Currently, the patient is showing significant overall cognitive deficits and decline.  The patient's best area of performance currently has to do with her verbal comprehension and the patient is showing good abilities with regard to her social judgment and comprehension abilities, verbal reasoning abilities, and vocabulary.  The patient is showing consistent and significant deficits with regard to her visual-spatial and visual organizational abilities, her overall speed of mental operation and focus execute abilities.  The patient is showing less significant deficits with regard to her auditory encoding abilities but these are down from those assessed 2 years ago.  With regard to the patient's memory functions there are even greater and more profound weakness is noted.  The patient showed significant deficits with regard to both visual and auditory learning initially as well as after period of delay.  The patient did not improve significantly with cueing.  The pattern of deficits would indicate significant impairments with regard to organization and storage of information and the ability to retrieve that information after period of delay.  The patient's encoding, while mildly impaired, would not explain the level of learning deficits identified on both visual and auditory sensory domains and therefore would not be explained by a pre-existing attention deficit disorder.  Impression/Diagnosis:   Overall, the results of the current neuropsychological evaluation are consistent with significant cognitive decline over the past 2-1/2 years.  While I initially saw the patient due to attentional changes, the observed deficits at that time primarily primarily were related to her multi processing  abilities and difficulty sustaining attention.  These weaknesses were offset by the patient's generally good information processing speed and encoding abilities.  At the time, that information combined with historical information suggested that her issues may have been related to a longstanding problem with attention deficit disorder.  However, the current neuropsychological evaluation would suggest progressive and significant decline over the past 2-1/2 years.  It is likely, that the initial changes identified by the patient and her family 3 years ago were simply early development of subjective symptoms in combination with mild cognitive changes at the early stages of this process.  However, over the past 2 and half years there does appear to be a  progressive decline in her cognitive functioning.  The family does not describe a stepwise decline but more of a progressive decline over the past 2-1/2 years.  I also took the opportunity to review an MRI that was conducted after my most recent clinical interview, which showed generally normal age-related changes with some very mild indications of small vessel disease but no indications of stroke or significant structural changes to her brain beyond those associated with normal age-related changes.  Given the current clinical data, past historical information, and current cognitive performance on a broad range of neuropsychological domains, I do think that the most likely diagnostic consideration should be dementia of the Alzheimer's type with late onset without behavioral disturbance.  The pattern of cognitive deficits without behavioral disturbance or not consistent with a frontotemporal type of dementia, her MRI and other data would not be consistent with a vascular related dementia and other patterns are not consistent with condition such as Lewy body related dementia.  I will provide feedback regarding the results of the current neuropsychological evaluation as  well as make sure that her treating neurologist at [facility] and her primary care physician Dr. Criss Rosales are  made aware of these findings so appropriate care can be provided going forward.  Diagnosis:    Axis I: Dementia of the Alzheimer's type with late onset without behavioral disturbance (HCC)  Moderate episode of recurrent major depressive disorder ([place])   Ilean Skill, Psy.D. Neuropsychologist          WAIS-IV Wechsler Adult Intelligence Scale-Fourth Edition Score Report   Examinee Name Pam Knapp  Date of Report 2018/12/17  Justice Rocher [id number]  Years of Education   Date of Birth 1945-12-14  Primary Language   Gender Female  Handedness   Race/Ethnicity   Examiner Name Ilean Skill  Date of Testing 2018/11/22  Age at Testing 20 years 5 months  Retest? No   Comments: Composite Score Summary  Scale Sum of Scaled Scores Composite Score Percentile Rank 95% Conf. Interval Qualitative Description  Verbal Comprehension 25 VCI 91 27 86-97 Average  Perceptual Reasoning 16 PRI 73 4 68-81 Borderline  Working Memory 13 WMI 80 9 74-88 Low Average  Processing Speed 4 PSI 56 0.2 52-69 Extremely Low  Full Scale 58 FSIQ 72 3 68-77 Borderline  General Ability 41 GAI 80 9 76-86 Low Average  Confidence Intervals are based on the Overall Average SEMs. The [facility] is an optional composite summary score that is less sensitive to the influence of working memory and processing speed. Because working memory and processing speed are vital to a comprehensive evaluation of cognitive ability, it should be noted that the [facility] does not have the breadth of construct coverage as the FSIQ.  Note. The vertical bars represent the standard error of measurement (SEM). SEM values are based on the examinee's age.   ANALYSIS   Index Level Discrepancy Comparisons  Comparison Score 1 Score 2 Difference Critical Value .05 Significant Difference Y/N Base Rate by Overall Sample  VCI -  PRI 91 73 18 8.31 Y 9.5  VCI - WMI 91 80 11 9.30 Y 19.7  VCI - PSI 91 56 35 10.19 Y 1.8  PRI - WMI 73 80 -7 10.18 N 31.8  PRI - PSI 73 56 17 11.00 Y 13.4  WMI - PSI 80 56 24 11.76 Y 6.7  FSIQ - GAI 72 80 -8 3.61 Y 5.7  Base Rate by Overall Sample. Statistical significance (critical value) at the .05 level.  Verbal Comprehension Subtests Summary  Subtest Raw Score Scaled Score Percentile Rank Reference Group Scaled Score SEM  Similarities 22 9 37 9 0.95  Vocabulary 35 10 50 10 0.67  Information 8 6 9 7  0.73  (Comprehension) 22 10 50 9 1.27   Perceptual Reasoning Subtests Summary  Subtest Raw Score Scaled Score Percentile Rank Reference Group Scaled Score SEM  Block Design 12 4 2 3  0.99  Matrix Reasoning 6 6 9 3  0.90  Visual Puzzles 6 6 9 4  0.99  (Picture Completion) 2 3 1 1  0.99   Working Doctor, general practice Raw Score Scaled Score Percentile Rank Reference Group Scaled Score SEM  Digit Span 17 6 9 4  0.73  Arithmetic 9 7 16 6  1.20   Processing Speed Subtests Summary  Subtest Raw Score Scaled Score Percentile Rank Reference Group Scaled Score SEM  Symbol Search 7 3 1 1  1.12  Coding 5 1 0.1 1 1.12   Subtest Level Discrepancy Comparisons  Subtest Comparison Score 1 Score 2 Difference Critical Value .05 Significant Difference Y/N Base Rate  Digit Span - Arithmetic 6 7 -1 2.57 N 42.20  Symbol Search - Coding 3 1 2  3.41 N 27.40  Statistical significance (critical value) at the .05 level.     DETERMINING STRENGTHS AND WEAKNESSES   Differences Between Subtest and Overall Mean of Subtest Scores  Subtest Subtest Scaled Score Mean Scaled Score Difference Critical Value .05 Strength or Weakness Base Rate  Block Design 4 5.80 -1.80 2.85  >25%  Similarities 9 5.80 3.20 2.82 S 10%  Digit Span 6 5.80 0.20 2.22  >25%  Matrix Reasoning 6 5.80 0.20 2.54  >25%  Vocabulary 10 5.80 4.20 2.03 S 2-5%  Arithmetic 7 5.80 1.20 2.73  >25%  Symbol Search 3 5.80 -2.80 3.42   15-25%  Visual Puzzles 6 5.80 0.20 2.71  >25%  Information 6 5.80 0.20 2.19  >25%  Coding 1 5.80 -4.80 2.97 W 2-5%  Overall: Mean = 5.80, Scatter =  9, Base rate =  16.7 Base Rate for Intersubtest Scatter is reported for 10 Subtests. Statistical significance (critical value) at the .05 level.   PROCESS ANALYSIS   Perceptual Reasoning Process Score Summary  Process Score Raw Score Scaled Score Percentile Rank SEM  Block Design No Time Bonus 12 4 2  1.04    Working Memory Process Score Summary  Process Score Raw Score Scaled Score Percentile Rank Base Rate SEM  Digit Span Forward 9 9 37 -- 1.04  Digit Span Backward 6 8 25  -- 1.37  Digit Span Sequencing 2 4 2  -- 1.12  Longest Digit Span Forward 7 -- -- 41.0 --  Longest Digit Span Backward 3 -- -- 95.0 --  Longest Digit Span Sequence 3 -- -- 93.0 --    Process Level Discrepancy Comparisons  Process Comparison Score 1 Score 2 Difference Critical Value .05 Significant Difference Y/N Base Rate  Block Design - Block Design No Time Bonus 4 4 0 3.08 N   Digit Span Forward - Digit Span Backward 9 8 1  3.65 N 39.8  Digit Span Forward - Digit Span Sequencing 9 4 5  3.60 Y 8.5  Digit Span Backward - Digit Span Sequencing 8 4 4  3.56 Y 12.5  Longest Digit Span Forward - Longest Digit Span Backward 7 3 4  -- -- 10.0  Longest Digit Span Forward - Longest Digit Span Sequence 7 3 4  -- -- 6.0  Longest Digit Span Backward - Longest Digit Span  Sequence 3 3 0 -- --   Statistical significance (critical value) at the .05 level.   Raw Scores  Subtest Score Range Raw Score  Process Score Range Raw Score  Block Design 0-66 12  Block Design No Time Bonus 0-48 12  Similarities 0-36 22  Digit Span Forward 0-16 9  Digit Span 0-48 17  Digit Span Backward 0-16 6  Matrix Reasoning 0-26 6  Digit Span Sequencing 0-16 2  Vocabulary 0-57 35  Longest Digit Span Forward 0, 2-9 7  Arithmetic 0-22 9  Longest Digit Span Backward 0, 2-8 3  Symbol Search  0-60 7  Longest Digit Span Sequence 0, 2-9 3  Visual Puzzles 0-26 6  Longest Letter-Number Seq. 0, 2-8   Information 0-26 8      Coding 0-135 5      Letter-Number Seq. 0-30       Figure Weights 0-27       Comprehension 0-36 22      Cancellation 0-72       Picture Completion 0-24 2        WMS-IV Wechsler Memory Scale-Fourth Edition Score Report   Examinee Name Pam Knapp  Date of Report 2018/12/17  Kingsley Spittle ID [id number]  Years of Education 31  Date of Birth 1945/12/08  Home Language   Gender Female  Handedness Not Specified  Race/Ethnicity   Examiner Name Ilean Skill  Date of Testing 2018/11/22  Age at Testing 75 years 5 months  Retest? No    Brief Cognitive Status Exam Classification  Age Years of Education Raw Score Classification Level Base Rate  72 years 5 months 12 31 Very Low 0.0    Index Score Summary  Index Sum of Scaled Scores Index Score Percentile Rank 95% Confidence Interval Qualitative Descriptor  Auditory Memory (AMI) 16 64 1 60-72 Extremely Low  Visual Memory (VMI) 3 45 <0.1 42-52 Extremely Low  Immediate Memory (IMI) 11 60 0.4 56-68 Extremely Low  Delayed Memory (DMI) 8 54 0.1 50-65 Extremely Low      Primary Subtest Scaled Score Summary  Subtest Domain Raw Score Scaled Score Percentile Rank  Logical Memory I AM 20 6 9   Logical Memory II AM 2 3 1   Verbal Paired Associates I AM 5 4 2   Verbal Paired Associates II AM 1 3 1   Visual Reproduction I VM 12 1 0.1  Visual Reproduction II VM 1 2 0.4  Symbol Span VWM 4 4 2     PROCESS SCORE CONVERSIONS  Auditory Memory Process Score Summary  Process Score Raw Score Scaled Score Percentile Rank Cumulative Percentage (Base Rate)  LM II Recognition 19 - - 51-75%  VPA II Recognition 19 - - 3-9%   Visual Memory Process Score Summary  Process Score Raw Score Scaled Score Percentile Rank Cumulative Percentage (Base Rate)  VR II Recognition 1 - - 3-9%    SUBTEST-LEVEL DIFFERENCES WITHIN  INDEXES  Auditory Memory Index  Subtest Scaled Score AMI Mean Score Difference from Mean Critical Value Base Rate  Logical Memory I 6 4.00 2.00 2.45 25%  Logical Memory II 3 4.00 -1.00 2.38 >25%  Verbal Paired Associates I 4 4.00 0.00 2.04 >25%  Verbal Paired Associates II 3 4.00 -1.00 3.06 >25%  Statistical significance (critical value) at the .05 level.   Immediate Memory Index  Subtest Scaled Score IMI Mean Score Difference from Mean Critical Value Base Rate  Logical Memory I 6 3.67 2.33 2.01 15-25%  Verbal Paired Associates I 4 3.67 0.33  1.71 >25%  Visual Reproduction I 1 3.67 -2.67 1.71 15%  Statistical significance (critical value) at the .05 level.   Delayed Memory Index  Subtest Scaled Score DMI Mean Score Difference from Mean Critical Value Base Rate  Logical Memory II 3 2.67 0.33 2.15 >25%  Verbal Paired Associates II 3 2.67 0.33 2.63 >25%  Visual Reproduction II 2 2.67 -0.67 1.77 >25%  Statistical significance (critical value) at the .05 level.    SUBTEST DISCREPANCY COMPARISON  Comparison Score 1 Score 2 Difference Critical Value Base Rate  Visual Reproduction I - Visual Reproduction II 1 2 -1 1.99 81.0  Statistical significance (critical value) at the .05 level.    SUBTEST-LEVEL CONTRAST SCALED SCORES  Logical Memory  Score Score 1 Score 2 Contrast Scaled Score  LM II Recognition vs. Delayed Recall 51-75% 3 1  LM Immediate Recall vs. Delayed Recall 6 3 3    Verbal Paired Associates  Score Score 1 Score 2 Contrast Scaled Score  VPA II Recognition vs. Delayed Recall 3-9% 3 6  VPA Immediate Recall vs. Delayed Recall 4 3 6    Visual Reproduction  Score Score 1 Score 2 Contrast Scaled Score  VR II Recognition vs. Delayed Recall 3-9% 2 5  VR Immediate Recall vs. Delayed Recall 1 2 6     INDEX-LEVEL CONTRAST SCALED SCORES  WMS-IV Indexes  Score Score 1 Score 2 Contrast Scaled Score  Auditory Memory Index vs. Visual Memory Index 64 45 1  Immediate Memory  Index vs. Delayed Memory Index 60 54 3    RAW SCORES  Subtest Score Range Adult Score Range Older Adult Raw Score  Brief Cognitive Status Exam 0-58 0-58 31  Logical Memory I 0-50 0-53 20  Logical Memory II 0-50 0-39 2  Verbal [facility]ired Associates I 0-56 0-40 5  Verbal [facility] II 0-14 0-10 1  CVLT-II Trials 1-5 5-95 5-95   CVLT-II Long-Delay -5-5 -5-5   Designs I 0-120    Designs II 0-120    Visual Reproduction I 0-43 0-43 12  Visual Reproduction II 0-43 0-43 1  Spatial Addition 0-24    Symbol Span 0-50 0-50 4  Process Score Range Adult Score Range Older Adult Raw Score  LM II Recognition 0-30 0-23 19  VPA II Recognition 0-40 0-30 19  VPA II Word Recall 0-28 0-20   DE I Content 0-48    DE I Spatial 0-24    DE II Content 0-48    DE II Spatial 0-24    DE II Recognition 0-24    VR II Recognition 0-7 0-7 1  VR II Copy 0-43 0-43     ABILITY-MEMORY ANALYSIS  Ability Score:  VCI: 91 Date of Testing:  WAIS-IV; WMS-IV 2018/11/22  Predicted Difference Method   Index Predicted WMS-IV Index Score Actual WMS-IV Index Score Difference Critical Value  Significant Difference Y/N Base Rate  Auditory Memory 95 64 31 10.41 Y 1%  Visual Memory 96 45 51 7.35 Y <1%  Immediate Memory 95 60 35 9.69 Y <1%  Delayed Memory 95 54 41 12.22 Y <1%  Statistical significance (critical value) at the .01 level.   Contrast Scaled Scores  Score Score 1 Score 2 Contrast Scaled Score  General Ability Index vs. Auditory Memory Index 80 64 4  General Ability Index vs. Visual Memory Index 80 45 1  General Ability Index vs. Immediate Memory Index 80 60 2  General Ability Index vs. Delayed Memory Index 80 54 1  Verbal Comprehension Index vs.  Auditory Memory Index 91 64 2  Perceptual Reasoning Index vs. Visual Memory Index 73 45 1  Working Memory Index vs. Auditory Memory Index 80 64 4    End of Report

## 2018-12-20 ENCOUNTER — Encounter: Payer: PPO | Admitting: Psychology

## 2018-12-20 ENCOUNTER — Encounter: Payer: Self-pay | Admitting: Psychology

## 2018-12-20 ENCOUNTER — Other Ambulatory Visit: Payer: Self-pay

## 2018-12-20 DIAGNOSIS — F331 Major depressive disorder, recurrent, moderate: Secondary | ICD-10-CM

## 2018-12-20 DIAGNOSIS — G301 Alzheimer's disease with late onset: Secondary | ICD-10-CM

## 2018-12-20 DIAGNOSIS — F0280 Dementia in other diseases classified elsewhere without behavioral disturbance: Secondary | ICD-10-CM | POA: Diagnosis not present

## 2018-12-20 DIAGNOSIS — R413 Other amnesia: Secondary | ICD-10-CM | POA: Diagnosis not present

## 2018-12-20 NOTE — Progress Notes (Signed)
Today I provided feedback to patient and her daughter.  Below is a copy of the summary and recommendations from the evaluation done on 3/10/202.     Summary of Results:                              Overall, the results of the current neuropsychological evaluation do show a significant deterioration in the patient's overall cognitive functioning from 2-1/2 years ago.  While in 2018 the patient was initially describing problems and worsening issues with regard to her attention and concentration and deficits were seen with regard to multi processing ability and sustaining attention at the time, the patient was doing well and some on the other attentional measures.  Currently, the patient is showing significant overall cognitive deficits and decline.  The patient's best area of performance currently has to do with her verbal comprehension and the patient is showing good abilities with regard to her social judgment and comprehension abilities, verbal reasoning abilities, and vocabulary.  The patient is showing consistent and significant deficits with regard to her visual-spatial and visual organizational abilities, her overall speed of mental operation and focus execute abilities.  The patient is showing less significant deficits with regard to her auditory encoding abilities but these are down from those assessed 2 years ago.  With regard to the patient's memory functions there are even greater and more profound weakness is noted.  The patient showed significant deficits with regard to both visual and auditory learning initially as well as after period of delay.  The patient did not improve significantly with cueing.  The pattern of deficits would indicate significant impairments with regard to organization and storage of information and the ability to retrieve that information after period of delay.  The patient's encoding, while mildly impaired, would not explain the level of learning deficits identified on both  visual and auditory sensory domains and therefore would not be explained by a pre-existing attention deficit disorder.  Impression/Diagnosis:                           Overall, the results of the current neuropsychological evaluation are consistent with significant cognitive decline over the past 2-1/2 years.  While I initially saw the patient due to attentional changes, the observed deficits at that time primarily primarily were related to her multi processing abilities and difficulty sustaining attention.  These weaknesses were offset by the patient's generally good information processing speed and encoding abilities.  At the time, that information combined with historical information suggested that her issues may have been related to a longstanding problem with attention deficit disorder.  However, the current neuropsychological evaluation would suggest progressive and significant decline over the past 2-1/2 years.  It is likely, that the initial changes identified by the patient and her family 3 years ago were simply early development of subjective symptoms in combination with mild cognitive changes at the early stages of this process.  However, over the past 2 and half years there does appear to be a progressive decline in her cognitive functioning.  The family does not describe a stepwise decline but more of a progressive decline over the past 2-1/2 years.  I also took the opportunity to review an MRI that was conducted after my most recent clinical interview, which showed generally normal age-related changes with some very mild indications of small vessel disease but no indications of stroke  or significant structural changes to her brain beyond those associated with normal age-related changes.  Given the current clinical data, past historical information, and current cognitive performance on a broad range of neuropsychological domains, I do think that the most likely diagnostic consideration should be  dementia of the Alzheimer's type with late onset without behavioral disturbance.  The pattern of cognitive deficits without behavioral disturbance or not consistent with a frontotemporal type of dementia, her MRI and other data would not be consistent with a vascular related dementia and other patterns are not consistent with condition such as Lewy body related dementia.  I will provide feedback regarding the results of the current neuropsychological evaluation as well as make sure that her treating neurologist at [facility] and her primary care physician Dr. Criss Rosales are  made aware of these findings so appropriate care can be provided going forward.  Diagnosis:                         Axis I: Dementia of the Alzheimer's type with late onset without behavioral disturbance ([place])  Moderate episode of recurrent major depressive disorder ([place])   Ilean Skill, Psy.D. Neuropsychologist

## 2019-02-10 ENCOUNTER — Other Ambulatory Visit: Payer: Self-pay | Admitting: *Deleted

## 2019-02-10 MED ORDER — AMLODIPINE BESYLATE 10 MG PO TABS: 10.0000 mg | ORAL_TABLET | Freq: Every day | ORAL | 0 refills | Status: DC

## 2019-02-10 MED ORDER — SPIRONOLACTONE 25 MG PO TABS: 25.0000 mg | ORAL_TABLET | Freq: Two times a day (BID) | ORAL | 0 refills | Status: DC

## 2019-02-10 NOTE — Telephone Encounter (Signed)
Received fax refill request from pharmacy.  Pended Rx and sent to [facility] for Approval due to [place].

## 2019-02-13 ENCOUNTER — Telehealth: Payer: Self-pay | Admitting: *Deleted

## 2019-02-13 NOTE — Telephone Encounter (Signed)
Called and spoke with daughter. Agreed to do [email or web address] virtual visit with Dr. Felecia Shelling on 02/18/19. I emailed her at [email or web address]. Asked her to call back if they do not receive email. I updated med list/pharmacy/allergies.  Pt had neuropsych testing done. Results can be viewed in epic.

## 2019-02-18 ENCOUNTER — Encounter: Payer: Self-pay | Admitting: Neurology

## 2019-02-18 ENCOUNTER — Other Ambulatory Visit: Payer: Self-pay

## 2019-02-18 ENCOUNTER — Telehealth: Payer: Self-pay | Admitting: Neurology

## 2019-02-18 ENCOUNTER — Ambulatory Visit (INDEPENDENT_AMBULATORY_CARE_PROVIDER_SITE_OTHER): Payer: PPO | Admitting: Neurology

## 2019-02-18 DIAGNOSIS — F329 Major depressive disorder, single episode, unspecified: Secondary | ICD-10-CM

## 2019-02-18 DIAGNOSIS — R413 Other amnesia: Secondary | ICD-10-CM | POA: Diagnosis not present

## 2019-02-18 DIAGNOSIS — H539 Unspecified visual disturbance: Secondary | ICD-10-CM

## 2019-02-18 DIAGNOSIS — F0280 Dementia in other diseases classified elsewhere without behavioral disturbance: Secondary | ICD-10-CM

## 2019-02-18 DIAGNOSIS — R41840 Attention and concentration deficit: Secondary | ICD-10-CM

## 2019-02-18 DIAGNOSIS — F32A Depression, unspecified: Secondary | ICD-10-CM

## 2019-02-18 DIAGNOSIS — G301 Alzheimer's disease with late onset: Secondary | ICD-10-CM

## 2019-02-18 DIAGNOSIS — G309 Alzheimer's disease, unspecified: Secondary | ICD-10-CM | POA: Insufficient documentation

## 2019-02-18 MED ORDER — DONEPEZIL HCL 5 MG PO TABS: 5.0000 mg | ORAL_TABLET | Freq: Every day | ORAL | 5 refills | Status: DC

## 2019-02-18 MED ORDER — BUPROPION HCL ER (XL) 150 MG PO TB24: 150.0000 mg | ORAL_TABLET | Freq: Every day | ORAL | 5 refills | Status: DC

## 2019-02-18 NOTE — Progress Notes (Signed)
[facility]  PATIENT: Pam Knapp DOB: 12-10-1945  REFERRING DOCTOR OR PCP:  [name] SOURCE: patient, notes from Dr. Venetia Maxon  _________________________________   HISTORICAL  CHIEF COMPLAINT:  Chief Complaint  Patient presents with  . Memory Loss  . Visual Field Change   Update 09/04/19: Virtual Visit via Video Note I connected with Pam Knapp on 02/18/19 at 11:30 AM EDT by a video enabled telemedicine application and verified that I am speaking with the correct person.  I discussed the limitations of evaluation and management by telemedicine and the availability of in person appointments. The patient expressed understanding and agreed to proceed.  History of Present Illness: She feel her memory is about the same as last visit. She felt memory worsened after being placed on Vyvanse (she stopped).   She had visual hallucinations at that time but none recently.      Labs and MRI were ok.    She has glaucoma and cataracts.    There is no change in her gait.   She has mild urinary urgency.  She has been diagnosed with ADD (cognitive testing in 2018) and feels her focus/attention is worse.  Repeat testing (Dr. Sima Matas).  I reviewed his comprehensive neuropsychologic evaluation performed in March.  Besides her memory and attentional issues.  She also showed difficulties with visual-spatial and visual organizational abilities.  There were milder deficits with auditory encoding abilities.  He felt she most likely has Alzheimer's dementia and moderate recurrent depression.  I discussed these findings with Pam Knapp and her daughter.  She notes mild depression but no anxiety.  The daughter notes she is more frustrated.   She feels a little worse with Covid as she is not socializing any.   She is sleeping well most nights.   She does not snore.  I personally reviewed the images from the 11/20/2018 MRI and looked at the interpreting physicians notes.   The MRI of the brain shows shows minimal age-appropriate atrophy and minimal chronic microvascular ischemic changes   Observations/Objective: She is a well-developed well-nourished woman in no acute distress.  The head is normocephalic and atraumatic.  Sclera are anicteric.  Visible skin appears normal.  The neck has a good range of motion.  She is alert and with fluent speech but reduced focus and memory.  Extraocular muscles are intact.  Facial strength is normal.   She appears to have normal strength in the arms.   Assessment and Plan: Late onset Alzheimer's disease without behavioral disturbance ([place])  Memory loss  Attention deficit  Visual disturbance  Depression, unspecified depression type  1.  I had a long conversation with her and her daughter about the combination of her deficits on neuropsychologic evaluation and her brain MRI which was actually fairly normal for age.  Early Alzheimer's is definitely a possibility though typically by the time a patient has early Alzheimer's hippocampal (mesial temporal) atrophy is evident on MRI.  Therefore other diagnoses are also possible.  Lewy body dementia will have more issues with visual and auditory though she does not have any movement disorders and no REM behavior disorder which are commonly seen.  Therefore it is less likely.  Depression may be playing a role in her memory issues. 2.   We will start Aricept 5 mg and Wellbutrin XL 150 mg daily.  These dosages can be adjusted in the future based on her response and tolerability. 3.  She should stay active and try to socialize some over  the phone.  She also should get out of the house every day even if just in the yard.  Advised to continue to socially distance, wash hands and follow other CDC guidance.  4.   She will return to see me in 3 months or sooner if there are new or worsening neurologic symptoms.  Follow Up Instructions: I discussed the assessment and treatment plan with the  patient. The patient was provided an opportunity to ask questions and all were answered. The patient agreed with the plan and demonstrated an understanding of the instructions.    The patient was advised to call back or seek an in-person evaluation if the symptoms worsen or if the condition fails to improve as anticipated.  I provided 28 minutes of non-face-to-face time during this encounter.  ______________________________ From initial visit 11/18/2018: HISTORY OF PRESENT ILLNESS:  Had the pleasure seeing a patient, Pam Knapp, at [facility] for neurologic consultation regarding her visual disturbance.  She also has had difficulties with memory.     She is a 73 y.o. woman who notices reduced vision when she looks to one side or the other.    She hasn't noted as much change with looking up or down.  She first noted problems with vision almos a year ago but it worsened more recently.   Dr. Venetia Maxon noted an altitudinal visual defect.    She has occasionally noted diplopia.  As an example, when she saw 2 pills she commented to her daughter that it looked like there were four.   Of note, she has had cataract surgery and has glaucoma.      She has had difficulty with short term memory the past 2 years.   Initially, she was just forgetful and would misplace items but she is doing much worse now.    She was diagnosed as having attention deficit disorder many years ago.  She was placed on Vyvanse last year.    She did not think it helped and she had sleepwalking and mild visual hallucinations on it.  It was stopped recently.     She is going to be seeing psychology for formal neurocognitive testing this week.       She notes no change in her walking.  She trips occasionally.      Montreal Cognitive Assessment  11/18/2018  Visuospatial/ Executive (0/5) 2  Naming (0/3) 2  Attention: Read list of digits (0/2) 2  Attention: Read list of letters (0/1) 1  Attention: Serial 7  subtraction starting at 100 (0/3) 1  Language: Repeat phrase (0/2) 1  Language : Fluency (0/1) 0  Abstraction (0/2) 2  Delayed Recall (0/5) 0  Orientation (0/6) 5  Total 16  Adjusted Score (based on education) 17     REVIEW OF SYSTEMS: Constitutional: No fevers, chills, sweats, or change in appetite Eyes: No visual changes, double vision, eye pain Ear, nose and throat: No hearing loss, ear pain, nasal congestion, sore throat Cardiovascular: No chest pain, palpitations Respiratory: No shortness of breath at rest or with exertion.   No wheezes GastrointestinaI: No nausea, vomiting, diarrhea, abdominal pain, fecal incontinence Genitourinary: No dysuria, urinary retention or frequency.  No nocturia. Musculoskeletal: No neck pain, back pain Integumentary: No rash, pruritus, skin lesions Neurological: as above Psychiatric: No depression at this time.  No anxiety Endocrine: No palpitations, diaphoresis, change in appetite, change in weigh or increased thirst Hematologic/Lymphatic: No anemia, purpura, petechiae. Allergic/Immunologic: No itchy/runny eyes, nasal congestion, recent allergic reactions, rashes  ALLERGIES: No Known Allergies  HOME MEDICATIONS:  Current Outpatient Medications:  .  amLODipine (NORVASC) 10 MG tablet, Take 1 tablet (10 mg total) by mouth daily., Disp: 90 tablet, Rfl: 0 .  aspirin 325 MG tablet, Take 325 mg by mouth as needed. , Disp: , Rfl:  .  buPROPion (WELLBUTRIN XL) 150 MG 24 hr tablet, Take 1 tablet (150 mg total) by mouth daily., Disp: 30 tablet, Rfl: 5 .  cetirizine (ZYRTEC) 10 MG tablet, Take 10 mg by mouth as needed for allergies., Disp: , Rfl:  .  COMBIGAN 0.2-0.5 % ophthalmic solution, 1 drop every 12 (twelve) hours. , Disp: , Rfl:  .  donepezil (ARICEPT) 5 MG tablet, Take 1 tablet (5 mg total) by mouth at bedtime., Disp: 30 tablet, Rfl: 5 .  doxazosin (CARDURA) 4 MG tablet, Take 4 mg by mouth daily. , Disp: , Rfl:  .  spironolactone (ALDACTONE)  25 MG tablet, Take 1 tablet (25 mg total) by mouth 2 (two) times daily., Disp: 180 tablet, Rfl: 0 .  vitamin C (ASCORBIC ACID) 500 MG tablet, Take 500 mg by mouth daily., Disp: , Rfl:   PAST MEDICAL HISTORY: Past Medical History:  Diagnosis Date  . ADHD   . Arthritis   . Cataracts, bilateral   . Depression   . Glaucoma   . Hypertension   . Polymyalgia rheumatica ([place])     PAST SURGICAL HISTORY: Past Surgical History:  Procedure Laterality Date  . PARTIAL HIP ARTHROPLASTY Right 2011  . REPLACEMENT TOTAL KNEE Left 2013   Dr Jodell Cipro    FAMILY HISTORY: Family History  Problem Relation Age of Onset  . Diabetes Mother   . Kidney failure Mother   . High blood pressure Sister   . Neuropathy Sister   . Heart disease Brother   . Asthma Sister   . Sleep apnea Sister   . Bipolar disorder Sister   . Diabetes Sister   . Diabetes Sister   . Breast cancer Sister   . Fibromyalgia Daughter     SOCIAL HISTORY:  Social History   Socioeconomic History  . Marital status: Widowed    Spouse name: Not on file  . Number of children: 1  . Years of education: BS  . Highest education level: Not on file  Occupational History  . Occupation: Retired  Scientific laboratory technician  . Financial resource strain: Not on file  . Food insecurity:    Worry: Not on file    Inability: Not on file  . Transportation needs:    Medical: Not on file    Non-medical: Not on file  Tobacco Use  . Smoking status: Former Smoker    Packs/day: 0.50    Years: 20.00    Pack years: 10.00  . Smokeless tobacco: Never Used  . Tobacco comment: quit over 20 years ago  Substance and Sexual Activity  . Alcohol use: Yes    Comment: 2-3 drinks a year  . Drug use: Never  . Sexual activity: Not on file  Lifestyle  . Physical activity:    Days per week: Not on file    Minutes per session: Not on file  . Stress: Not on file  Relationships  . Social connections:    Talks on phone: Not on file    Gets together: Not on file     Attends religious service: Not on file    Active member of club or organization: Not on file    Attends meetings of  clubs or organizations: Not on file    Relationship status: Not on file  . Intimate partner violence:    Fear of current or ex partner: Not on file    Emotionally abused: Not on file    Physically abused: Not on file    Forced sexual activity: Not on file  Other Topics Concern  . Not on file  Social History Narrative   Lives alone   Caffeine use: 2 coffees per day   Right handed       Social History      Diet? Regular diet       Do you drink/eat things with caffeine? Yes coffee      Marital status?                widow                    What year were you married? Da[place] you live in a house, apartment, assisted living, condo, trailer, etc.? Apartment       Is it one or more stories? one      How many persons live in your home? one      Do you have any pets in your home? (please list) cockatiel       Highest level of education completed? 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    yes                                  Type & how often? Daily chair aerobics - water aerobics       Advanced Directives      Do you have a living will? No       Do you have a DNR form?            no                      If not, do you want to discuss one? yes      Do you have signed POA/HPOA for forms? No       Functional Status      Do you have difficulty bathing or dressing yourself? No       Do you have difficulty preparing food or eating? No       Do you have difficulty managing your medications? yes      Do you have difficulty managing your finances? Yes daughter manages       Do you have difficulty affording your medications? no        PHYSICAL EXAM  There were no vitals filed for this visit.  There is no height or weight on file to calculate BMI.   General: The patient is well-developed and  well-nourished and in no acute distress  Eyes:  Limited funduscopic exam shows no papiledema and normal retinal vessels.  Neck: The neck is supple, no carotid bruits are noted.  The neck is nontender.  Cardiovascular: The heart has a regular rate and rhythm with a normal S1 and S2. There were no murmurs, gallops or rubs. Lungs are clear to auscultation.  Skin: Extremities are without significant edema.  Musculoskeletal:  Back is nontender  Neurologic Exam  Mental status: The patient is alert and oriented x 2  1/2 (correct month and year but 1 date) at the time of the examination.  She had reduced visual-spatial abilities, executive function, attention and very poor short-term memory.Marland Kitchen   Speech is normal.  Cranial nerves: Extraocular movements are full. Pupils are equal, round, and reactive to light and accomodation.  Visual fields are full.  Facial symmetry is present. There is good facial sensation to soft touch bilaterally.Facial strength is normal.  Trapezius and sternocleidomastoid strength is normal. No dysarthria is noted.  The tongue is midline, and the patient has symmetric elevation of the soft palate. No obvious hearing deficits are noted.  Motor:  Muscle bulk is normal.   Tone is normal. Strength is  5 / 5 in all 4 extremities.   Sensory: Sensory testing is intact to pinprick, soft touch and vibration sensation in all 4 extremities.  Coordination: Cerebellar testing reveals good finger-nose-finger and heel-to-shin bilaterally.  Gait and station: Station is normal.   Gait is normal. Tandem gait is mildly wide.   No retropulsion.   Romberg is negative.   Reflexes: She has a palmomental reflex.  Deep tendon reflexes are symmetric and normal in arms and 1 at knees..   Plantar responses are flexor.    DIAGNOSTIC DATA (LABS, IMAGING, TESTING) - I reviewed patient records, labs, notes, testing and imaging myself where available.  Lab Results  Component Value Date   WBC 3.2 (L)  12/10/2018   HGB 12.1 12/10/2018   HCT 36.8 12/10/2018   MCV 92.0 12/10/2018   PLT 166 12/10/2018      Component Value Date/Time   NA 139 12/10/2018 0857   K 4.2 12/10/2018 0857   CL 104 12/10/2018 0857   CO2 28 12/10/2018 0857   GLUCOSE 97 12/10/2018 0857   BUN 15 12/10/2018 0857   CREATININE 0.74 12/10/2018 0857   CALCIUM 9.8 12/10/2018 0857   PROT 7.1 12/10/2018 0857   ALBUMIN 3.9 11/17/2009 1457   AST 15 12/10/2018 0857   ALT 10 12/10/2018 0857   ALKPHOS 77 11/17/2009 1457   BILITOT 0.5 12/10/2018 0857   GFRNONAA 80 12/10/2018 0857   GFRAA 93 12/10/2018 0857       ASSESSMENT AND PLAN  Late onset Alzheimer's disease without behavioral disturbance ([place])  Memory loss  Attention deficit  Visual disturbance  Depression, unspecified depression type   Richard A. Felecia Shelling, MD, [facility] 02/02/8340, 96:22 AM Certified in Neurology, Clinical Neurophysiology, Sleep Medicine, Pain Medicine and Neuroimaging  [facility] [address] [place], [place] [address] [phone]

## 2019-02-18 NOTE — Telephone Encounter (Signed)
02/18/19 - LVM to schedule 3 month follow-up per Dr. Felecia Shelling

## 2019-03-12 ENCOUNTER — Other Ambulatory Visit: Payer: Self-pay | Admitting: Neurology

## 2019-03-24 ENCOUNTER — Ambulatory Visit: Payer: PPO | Admitting: Internal Medicine

## 2019-04-24 ENCOUNTER — Ambulatory Visit: Payer: PPO | Admitting: Internal Medicine

## 2019-04-25 ENCOUNTER — Ambulatory Visit (INDEPENDENT_AMBULATORY_CARE_PROVIDER_SITE_OTHER): Payer: PPO | Admitting: Family

## 2019-04-25 ENCOUNTER — Encounter: Payer: Self-pay | Admitting: Family

## 2019-04-25 ENCOUNTER — Other Ambulatory Visit: Payer: Self-pay

## 2019-04-25 DIAGNOSIS — I10 Essential (primary) hypertension: Secondary | ICD-10-CM

## 2019-04-25 DIAGNOSIS — M353 Polymyalgia rheumatica: Secondary | ICD-10-CM | POA: Diagnosis not present

## 2019-04-25 DIAGNOSIS — H409 Unspecified glaucoma: Secondary | ICD-10-CM

## 2019-04-25 DIAGNOSIS — G4700 Insomnia, unspecified: Secondary | ICD-10-CM | POA: Diagnosis not present

## 2019-04-25 DIAGNOSIS — F0280 Dementia in other diseases classified elsewhere without behavioral disturbance: Secondary | ICD-10-CM | POA: Diagnosis not present

## 2019-04-25 DIAGNOSIS — G301 Alzheimer's disease with late onset: Secondary | ICD-10-CM

## 2019-04-25 DIAGNOSIS — F32A Depression, unspecified: Secondary | ICD-10-CM

## 2019-04-25 DIAGNOSIS — F329 Major depressive disorder, single episode, unspecified: Secondary | ICD-10-CM | POA: Diagnosis not present

## 2019-04-25 MED ORDER — MELATONIN 3 MG PO TABS: 3.0000 mg | ORAL_TABLET | Freq: Every day | ORAL | 3 refills | Status: DC

## 2019-04-25 NOTE — Progress Notes (Signed)
This service is provided via telemedicine  No vital signs collected/recorded due to the encounter was a telemedicine visit.   Location of patient (ex: home, work):  Home   Patient consents to a telephone visit: Yes  Location of the provider (ex: office, home): Office    Name of any referring provider:  N/A  Names of all persons participating in the telemedicine service and their role in the encounter:  Marlowe Sax, NP, Ruthell Rummage CMA, [name]   Time spent on call : Ruthell Rummage CMA,  Spent 10  Minutes on phone with patient     Location:  [facility] clinic  Provider: Marlowe Sax, NP  Code Status: FULL Goals of Care:  Advanced Directives 12/06/2018  Does Patient Have a Medical Advance Directive? No  Would patient like information on creating a medical advance directive? No - Patient declined     Chief Complaint  Patient presents with  . Medical Management of Chronic Issues    4 month follow up     HPI: Patient is a 73 y.o. female seen today for medical management of chronic diseases.she complains of not sleeping well at night.she states sleeps around 9 Am and wakes up at 1 Am unable to go back to sleep so she does some video exercises for 30 minutes.By around 7 am she takes a nap.she does sleeps with her Television at night.she saw her Neurologist 02/18/2019 was started on Aricept 5 mg tablet for dementia and Wellbutrin XL 150 mg tablet daily for depression.she states feeling much better and doing well since starting on the medication.she still has memory issues.she walks around her apartment complex about a mile daily.She enjoys being outside.she wears her mask and practice social distancing and hand hygiene. She describes her appetite as good.she thinks she lost weight weight by exercising walking and video exercise for 30 minutes twice daily.she includes fruits and vegetables in her diet.   Hypertension - No blood pressure log for review.on amlodipine 10 mg tablet  daily,doxazosin 4 mg tablet daily and spironolactone 25 mg tablet twice daily.she denies any headache,dizziness,chest pain or shortness of breath.she states her vision has worsen though thinks this is due to her glaucoma.she does not have an appointment for follow up with ophthalmology but will call to schedule.   Polymyalgia Rheumatica - has stiffness but improves with her exercises.she has not seen a rheumatologist in a while but not willing to go " I don't want to take prednisone".   ADHD- follows up with Neurology.seen 02/18/2019.currently off medication.states seems to be doing much better since started on antidepressant.    Glaucoma - worsening vision.she will follow up with eye doctor.        Past Medical History:  Diagnosis Date  . ADHD   . Arthritis   . Cataracts, bilateral   . Depression   . Glaucoma   . Hypertension   . Polymyalgia rheumatica (HCC)     Past Surgical History:  Procedure Laterality Date  . PARTIAL HIP ARTHROPLASTY Right 2011  . REPLACEMENT TOTAL KNEE Left 2013   Dr Jodell Cipro    No Known Allergies  Outpatient Encounter Medications as of 04/25/2019  Medication Sig  . amLODipine (NORVASC) 10 MG tablet Take 1 tablet (10 mg total) by mouth daily.  Marland Kitchen aspirin 325 MG tablet Take 325 mg by mouth as needed.   Marland Kitchen buPROPion (WELLBUTRIN XL) 150 MG 24 hr tablet TAKE 1 TABLET BY MOUTH EVERY DAY  . cetirizine (ZYRTEC) 10 MG tablet Take  10 mg by mouth as needed for allergies.  . COMBIGAN 0.2-0.5 % ophthalmic solution 1 drop every 12 (twelve) hours.   Marland Kitchen donepezil (ARICEPT) 5 MG tablet Take 1 tablet (5 mg total) by mouth at bedtime.  Marland Kitchen doxazosin (CARDURA) 4 MG tablet Take 4 mg by mouth daily.   Marland Kitchen spironolactone (ALDACTONE) 25 MG tablet Take 1 tablet (25 mg total) by mouth 2 (two) times daily.  . vitamin C (ASCORBIC ACID) 500 MG tablet Take 500 mg by mouth daily.   No facility-administered encounter medications on file as of 04/25/2019.     Review of Systems:  Review of  Systems  Constitutional: Negative for appetite change, chills, fatigue and fever.  HENT: Negative for congestion, dental problem, hearing loss, rhinorrhea, sinus pressure, sinus pain, sneezing, sore throat and trouble swallowing.        Wears dentures both upper and bottom   Eyes: Positive for visual disturbance. Negative for pain, discharge, redness and itching.       Hx cataract,glaucoma follows up with ophthalmology.  Respiratory: Negative for cough, chest tightness, shortness of breath and wheezing.   Cardiovascular: Negative for chest pain, palpitations and leg swelling.  Gastrointestinal: Negative for abdominal distention, abdominal pain, constipation, diarrhea, nausea and vomiting.  Endocrine: Negative for cold intolerance, heat intolerance, polydipsia, polyphagia and polyuria.  Genitourinary: Negative for difficulty urinating, dysuria, flank pain, frequency and urgency.       Had burning with urination but increased her water intake which helped.  Musculoskeletal: Positive for arthralgias and gait problem. Negative for neck pain.       Uses Aspercreme to shoulders,neck,lower back pain.uses a cane when walking.  Skin: Negative for color change, pallor and rash.  Neurological: Negative for dizziness, tremors, speech difficulty, weakness, light-headedness and headaches.  Hematological: Does not bruise/bleed easily.  Psychiatric/Behavioral: Positive for sleep disturbance. Negative for agitation and confusion. The patient is not nervous/anxious.     Health Maintenance  Topic Date Due  . Hepatitis C Screening  [birth year]  . TETANUS/TDAP  05/30/1964  . COLONOSCOPY  05/31/1995  . DEXA SCAN  05/30/2010  . PNA vac Low Risk Adult (1 of 2 - PCV13) 05/30/2010  . INFLUENZA VACCINE  05/10/2019  . MAMMOGRAM  10/24/2019    Physical Exam: There were no vitals filed for this visit. There is no height or weight on file to calculate BMI. Physical Exam Unable to complete on telephone visit.    Labs reviewed: Basic Metabolic Panel: Recent Labs    11/18/18 1113 12/10/18 0857  NA  --  139  K  --  4.2  CL  --  104  CO2  --  28  GLUCOSE  --  97  BUN  --  15  CREATININE  --  0.74  CALCIUM  --  9.8  TSH 3.170  --    Liver Function Tests: Recent Labs    12/10/18 0857  AST 15  ALT 10  BILITOT 0.5  PROT 7.1   CBC: Recent Labs    12/10/18 0857  WBC 3.2*  NEUTROABS 1,459*  HGB 12.1  HCT 36.8  MCV 92.0  PLT 166   Lipid Panel: Recent Labs    12/10/18 0857  CHOL 160  HDL 59  LDLCALC 86  TRIG 61  CHOLHDL 2.7   Lab Results  Component Value Date   HGBA1C 5.6 12/06/2018    Procedures since last visit: No results found.  Assessment/Plan 1. Essential hypertension No blood pressure log for review.she reports  no signs of hypo/hypertension.continue on amlodipine 10 mg tablet daily,doxazosin 4 mg tablet daily and spironolactone 25 mg tablet twice daily.on ASA for cardiovascular event prophylaxis.  2. Depression, unspecified depression type Reports mood stable.feeling much better since starting on Wellbutrin.she sounded very vibrant on the phone.continue on Wellbutrin XL 150 mg tablet daily.will add TSH to her future lab work.   3. Polymyalgia rheumatica (HCC) Reports pain to shoulders,neck and lower back using Aspercreme which helps to relief symptoms.Also does video exercise 30 minutes daily and walks around her apartment complex about 1 mile daily.Declines referral to Rheumatologist for now.  4. Insomnia, unspecified type Sleep hygiene discussed.Encouraged to switch the TV off one hour prior to bedtime  5. Late onset Alzheimer's disease without behavioral disturbance ([place]) Follows up with Neurologist.Started on Aricept 5 mg tablet daily 02/18/2019.Daughter reports no new behavioral issues.  6. Glaucoma of both eyes, unspecified glaucoma type Worsening vision.Will follow up with Ophthalmology.  Labs/tests ordered: Has labs scheduled for 06/17/2019   Next  appt:  06/17/2019 for labs. 4 months follow up for medical management of chronic issues.   Spent 26 minutes of non-face to face with patient

## 2019-05-06 ENCOUNTER — Other Ambulatory Visit: Payer: Self-pay | Admitting: Family

## 2019-05-21 ENCOUNTER — Encounter: Payer: Self-pay | Admitting: Neurology

## 2019-05-21 ENCOUNTER — Ambulatory Visit: Payer: Self-pay | Admitting: Neurology

## 2019-06-17 ENCOUNTER — Other Ambulatory Visit: Payer: PPO

## 2019-07-17 ENCOUNTER — Other Ambulatory Visit: Payer: PPO

## 2019-07-20 ENCOUNTER — Other Ambulatory Visit: Payer: Self-pay | Admitting: Family

## 2019-07-21 NOTE — Telephone Encounter (Signed)
Not on current medication list. Request is for an OTC medcation, will send to [name], Dinah C, NP to advise

## 2019-08-07 ENCOUNTER — Other Ambulatory Visit: Payer: Self-pay | Admitting: Neurology

## 2019-08-26 ENCOUNTER — Ambulatory Visit: Payer: PPO | Admitting: Family

## 2019-09-01 ENCOUNTER — Telehealth: Payer: Self-pay | Admitting: Family

## 2019-09-01 NOTE — Telephone Encounter (Signed)
Called patient to reschedule appt. That she missed on 11/17.  Appointment rescheduled

## 2019-09-03 ENCOUNTER — Ambulatory Visit: Payer: PPO | Admitting: Neurology

## 2019-09-03 ENCOUNTER — Encounter: Payer: Self-pay | Admitting: Neurology

## 2019-09-03 ENCOUNTER — Other Ambulatory Visit: Payer: Self-pay

## 2019-09-03 VITALS — BP 152/75 | HR 74 | Temp 96.9°F | Ht 69.0 in | Wt 193.3 lb

## 2019-09-03 DIAGNOSIS — G301 Alzheimer's disease with late onset: Secondary | ICD-10-CM

## 2019-09-03 DIAGNOSIS — F32A Depression, unspecified: Secondary | ICD-10-CM

## 2019-09-03 DIAGNOSIS — H539 Unspecified visual disturbance: Secondary | ICD-10-CM | POA: Diagnosis not present

## 2019-09-03 DIAGNOSIS — F0280 Dementia in other diseases classified elsewhere without behavioral disturbance: Secondary | ICD-10-CM

## 2019-09-03 DIAGNOSIS — F329 Major depressive disorder, single episode, unspecified: Secondary | ICD-10-CM

## 2019-09-03 MED ORDER — DONEPEZIL HCL 10 MG PO TABS: ORAL_TABLET | ORAL | 3 refills | Status: DC

## 2019-09-03 MED ORDER — BUPROPION HCL ER (XL) 150 MG PO TB24: 150.0000 mg | ORAL_TABLET | Freq: Every day | ORAL | 3 refills | Status: DC

## 2019-09-03 NOTE — Progress Notes (Signed)
[facility]  PATIENT: Pam Knapp DOB: 10-Sep-1946  REFERRING DOCTOR OR PCP:  Pam Knapp SOURCE: patient, notes from Dr. Venetia Maxon  _________________________________   HISTORICAL  CHIEF COMPLAINT:  Chief Complaint  Patient presents with   Follow-up    Rm 13 here with daughter f/u on memory loss    Update 09/03/2019: She has AD and started donepezil 5 mg earlier this year.   She feels she is doing better.   Her daughter got her a Arts development officer calendar and she feels her orientation is better.    She is doing better with her mood, both the mild depression and anxiety.  She is on Wellbutrin once a day..  She tries to keep up with sister daily and friends over the phone.   She is physically active.  [facility] and misses the social aspect of exercise.     She had visual hallucinations earlier this year but they no longer has these.   She talks in her sleep and occasionally moves her arm (like swatting a fly) as she talks but does not kick or scream or get out of bed.    She has no bradykinesia, tremor and gait is stable with a reasonably good stride. . Because her gait is mildly well-balanced, she uses a cane when outdoors.    Update 02/18/19: She feel her memory is about the same as last visit. She felt memory worsened after being placed on Vyvanse (she stopped).   She had visual hallucinations at that time but none recently.      Labs and MRI were ok.    She has glaucoma and cataracts.    There is no change in her gait.   She has mild urinary urgency.  She has been diagnosed with ADD (cognitive testing in 2018) and feels her focus/attention is worse.  Repeat testing (Dr. Sima Matas).  I reviewed his comprehensive neuropsychologic evaluation performed in March.  Besides her memory and attentional issues.  She also showed difficulties with visual-spatial and visual organizational abilities.  There were milder deficits with auditory encoding abilities.  He  felt she most likely has Alzheimer's dementia and moderate recurrent depression.  I discussed these findings with Pam Knapp and her daughter.  She notes mild depression but no anxiety.  The daughter notes she is more frustrated.   She feels a little worse with Covid as she is not socializing any.   She is sleeping well most nights.   She does not snore.  I personally reviewed the images from the 11/20/2018 MRI and looked at the interpreting physicians notes.  The MRI of the brain shows shows minimal age-appropriate atrophy and minimal chronic microvascular ischemic changes   11/18/2018 HISTORY OF PRESENT ILLNESS:  Had the pleasure seeing a patient, Pam Knapp, at [facility] for neurologic consultation regarding her visual disturbance.  She also has had difficulties with memory.     She is a 73 y.o. woman who notices reduced vision when she looks to one side or the other.    She hasn't noted as much change with looking up or down.  She first noted problems with vision almos a year ago but it worsened more recently.   Dr. Venetia Maxon noted an altitudinal visual defect.    She has occasionally noted diplopia.  As an example, when she saw 2 pills she commented to her daughter that it looked like there were four.   Of note, she has had  cataract surgery and has glaucoma.      She has had difficulty with short term memory the past 2 years.   Initially, she was just forgetful and would misplace items but she is doing much worse now.    She was diagnosed as having attention deficit disorder many years ago.  She was placed on Vyvanse last year.    She did not think it helped and she had sleepwalking and mild visual hallucinations on it.  It was stopped recently.     She is going to be seeing psychology for formal neurocognitive testing this week.       She notes no change in her walking.  She trips occasionally.       [place] Cognitive Assessment  09/03/2019 09/03/2019 11/18/2018    Visuospatial/ Executive (0/5) 1 1 2   Naming (0/3) 2 2 2   Attention: Read list of digits (0/2) 2 1 2   Attention: Read list of letters (0/1) 1 1 1   Attention: Serial 7 subtraction starting at 100 (0/3) 0 1 1  Language: Repeat phrase (0/2) 1 1 1   Language : Fluency (0/1) 0 0 0  Abstraction (0/2) 2 2 2   Delayed Recall (0/5) 0 0 0  Orientation (0/6) 4 4 5   Total 13 13 16   Adjusted Score (based on education) 14 13 17      REVIEW OF SYSTEMS: Constitutional: No fevers, chills, sweats, or change in appetite Eyes: No visual changes, double vision, eye pain Ear, nose and throat: No hearing loss, ear pain, nasal congestion, sore throat Cardiovascular: No chest pain, palpitations Respiratory: No shortness of breath at rest or with exertion.   No wheezes GastrointestinaI: No nausea, vomiting, diarrhea, abdominal pain, fecal incontinence Genitourinary: No dysuria, urinary retention or frequency.  No nocturia. Musculoskeletal: No neck pain, back pain Integumentary: No rash, pruritus, skin lesions Neurological: as above Psychiatric: No depression at this time.  No anxiety Endocrine: No palpitations, diaphoresis, change in appetite, change in weigh or increased thirst Hematologic/Lymphatic: No anemia, purpura, petechiae. Allergic/Immunologic: No itchy/runny eyes, nasal congestion, recent allergic reactions, rashes  ALLERGIES: No Known Allergies  HOME MEDICATIONS:  Current Outpatient Medications:    amLODipine (NORVASC) 10 MG tablet, TAKE 1 TABLET BY MOUTH EVERY DAY, Disp: 90 tablet, Rfl: 1   aspirin 325 MG tablet, Take 325 mg by mouth as needed. , Disp: , Rfl:    buPROPion (WELLBUTRIN XL) 150 MG 24 hr tablet, Take 1 tablet (150 mg total) by mouth daily., Disp: 90 tablet, Rfl: 3   cetirizine (ZYRTEC) 10 MG tablet, Take 10 mg by mouth as needed for allergies., Disp: , Rfl:    COMBIGAN 0.2-0.5 % ophthalmic solution, 1 drop every 12 (twelve) hours. , Disp: , Rfl:     Cranberry-Cholecalciferol 4200-500 MG-UNIT CAPS, Take 1 capsule by mouth daily., Disp: , Rfl:    [employer]S MELATONIN 3 MG TABS, TAKE 1 TABLET (3 MG TOTAL) BY MOUTH AT BEDTIME. MAY REPEAT X 1 DOSE., Disp: 90 tablet, Rfl: 1   donepezil (ARICEPT) 10 MG tablet, TAKE 1 TABLET BY MOUTH EVERYDAY AT BEDTIME, Disp: 90 tablet, Rfl: 3   doxazosin (CARDURA) 4 MG tablet, Take 4 mg by mouth daily. , Disp: , Rfl:    spironolactone (ALDACTONE) 25 MG tablet, TAKE 1 TABLET BY MOUTH TWICE A DAY, Disp: 180 tablet, Rfl: 1   Turmeric (QC TUMERIC COMPLEX PO), Take 15 mLs by mouth daily., Disp: , Rfl:    vitamin C (ASCORBIC ACID) 500 MG tablet, Take 500 mg by  mouth daily., Disp: , Rfl:    Vitamin D, Ergocalciferol, (DRISDOL) 1.25 MG (50000 UT) CAPS capsule, Take 50,000 Units by mouth daily., Disp: , Rfl:   PAST MEDICAL HISTORY: Past Medical History:  Diagnosis Date   ADHD    Arthritis    Cataracts, bilateral    Depression    Glaucoma    Hypertension    Polymyalgia rheumatica ([place])     PAST SURGICAL HISTORY: Past Surgical History:  Procedure Laterality Date   PARTIAL HIP ARTHROPLASTY Right 2011   REPLACEMENT TOTAL KNEE Left 2013   Dr Jodell Cipro    FAMILY HISTORY: Family History  Problem Relation Age of Onset   Diabetes Mother    Kidney failure Mother    High blood pressure Sister    Neuropathy Sister    Heart disease Brother    Asthma Sister    Sleep apnea Sister    Bipolar disorder Sister    Diabetes Sister    Diabetes Sister    Breast cancer Sister    Fibromyalgia Daughter     SOCIAL HISTORY:  Social History   Socioeconomic History   Marital status: Widowed    Spouse name: Not on file   Number of children: 1   Years of education: BS   Highest education level: Not on file  Occupational History   Occupation: Retired  Scientist, product/process development strain: Not on file   Food insecurity    Worry: Not on file    Inability: Not on Lexicographer  needs    Medical: Not on file    Non-medical: Not on file  Tobacco Use   Smoking status: Former Smoker    Packs/day: 0.50    Years: 20.00    Pack years: 10.00   Smokeless tobacco: Never Used   Tobacco comment: quit over 20 years ago  Substance and Sexual Activity   Alcohol use: Yes    Comment: 2-3 drinks a year   Drug use: Never   Sexual activity: Not on file  Lifestyle   Physical activity    Days per week: Not on file    Minutes per session: Not on file   Stress: Not on file  Relationships   Social connections    Talks on phone: Not on file    Gets together: Not on file    Attends religious service: Not on file    Active member of club or organization: Not on file    Attends meetings of clubs or organizations: Not on file    Relationship status: Not on file   Intimate partner violence    Fear of current or ex partner: Not on file    Emotionally abused: Not on file    Physically abused: Not on file    Forced sexual activity: Not on file  Other Topics Concern   Not on file  Social History Narrative   Lives alone   Caffeine use: 2 coffees per day   Right handed       Social History      Diet? Regular diet       Do you drink/eat things with caffeine? Yes coffee      Marital status?                widow                    What year were you married? [place]  Do you live in a house, apartment, assisted living, condo, trailer, etc.? Apartment       Is it one or more stories? one      How many persons live in your home? one      Do you have any pets in your home? (please list) cockatiel       Highest level of education completed? 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    yes                                  Type & how often? Daily chair aerobics - water aerobics       Advanced Directives      Do you have a living will? No       Do you have a DNR form?            no                      If not, do you  want to discuss one? yes      Do you have signed POA/HPOA for forms? No       Functional Status      Do you have difficulty bathing or dressing yourself? No       Do you have difficulty preparing food or eating? No       Do you have difficulty managing your medications? yes      Do you have difficulty managing your finances? Yes daughter manages       Do you have difficulty affording your medications? no        PHYSICAL EXAM  Vitals:   09/03/19 1519  BP: (!) 152/75  Pulse: 74  Temp: (!) 96.9 F (36.1 C)  TempSrc: Temporal  Weight: 193 lb 5 oz (87.7 kg)  Height: 5\' 9"  (1.753 m)    Body mass index is 28.55 kg/m.   General: The patient is well-developed and well-nourished and in no acute distress  Neurologic Exam  Mental status: The patient is alert and oriented x 2 1/2 (correct month and year but 1 date) at the time of the examination.  She had reduced visual-spatial abilities, executive function, attention and very poor short-term memory.Marland Kitchen   Speech is normal.  Cranial nerves: Extraocular movements are full. Pupils are equal, round, and reactive to light and accomodation.  Visual fields are full.  Facial symmetry is present. There is good facial sensation to soft touch bilaterally.Facial strength is normal.  Trapezius and sternocleidomastoid strength is normal. No dysarthria is noted.  The tongue is midline, and the patient has symmetric elevation of the soft palate. No obvious hearing deficits are noted.  Motor:  Muscle bulk is normal.   Tone is normal. Strength is  5 / 5 in all 4 extremities.   Sensory: Sensory testing is intact to pinprick, soft touch and vibration sensation in all 4 extremities.  Coordination: Cerebellar testing reveals good finger-nose-finger and heel-to-shin bilaterally.  Gait and station: Station is normal.  Gait is mildly wide.  Tandem gait is wide.  There is no retropulsion..   Romberg is negative.   Reflexes:   She has a palmomental reflex.   Deep tendon reflexes are symmetric and normal in arms and 1 at knees.Marland Kitchen       DIAGNOSTIC DATA (LABS,  IMAGING, TESTING) - I reviewed patient records, labs, notes, testing and imaging myself where available.  Lab Results  Component Value Date   WBC 3.2 (L) 12/10/2018   HGB 12.1 12/10/2018   HCT 36.8 12/10/2018   MCV 92.0 12/10/2018   PLT 166 12/10/2018      Component Value Date/Time   NA 139 12/10/2018 0857   K 4.2 12/10/2018 0857   CL 104 12/10/2018 0857   CO2 28 12/10/2018 0857   GLUCOSE 97 12/10/2018 0857   BUN 15 12/10/2018 0857   CREATININE 0.74 12/10/2018 0857   CALCIUM 9.8 12/10/2018 0857   PROT 7.1 12/10/2018 0857   ALBUMIN 3.9 11/17/2009 1457   AST 15 12/10/2018 0857   ALT 10 12/10/2018 0857   ALKPHOS 77 11/17/2009 1457   BILITOT 0.5 12/10/2018 0857   GFRNONAA 80 12/10/2018 0857   GFRAA 93 12/10/2018 0857       ASSESSMENT AND PLAN  Late onset Alzheimer's disease without behavioral disturbance (HCC)  Depression, unspecified depression type  Visual disturbance  1.  Increase donepezil to 10 mg daily.  Although her Jenetta Loges cognitive assessment score was a little worse she feels she is doing better.  Her daughter also thinks that she is doing better. 2.  Continue Wellbutrin for mood.  She is doing better with mood. 3.  Stay active and exercise as tolerated. 4.   Return to see [place] in 6 months or sooner if there are new or worsening neurologic symptoms. Jamyria Ozanich A. Felecia Shelling, MD, PhD, Charlynn Grimes [id number], [id number] PM Certified in Neurology, Clinical Neurophysiology, Sleep Medicine, Pain Medicine and Neuroimaging  [facility] [address], [place], [place] [address] [phone]

## 2019-09-09 ENCOUNTER — Encounter: Payer: Self-pay | Admitting: Family

## 2019-09-09 ENCOUNTER — Other Ambulatory Visit: Payer: Self-pay

## 2019-09-09 ENCOUNTER — Ambulatory Visit (INDEPENDENT_AMBULATORY_CARE_PROVIDER_SITE_OTHER): Payer: PPO | Admitting: Family

## 2019-09-09 VITALS — BP 130/78 | HR 63 | Temp 97.3°F | Ht 69.0 in | Wt 192.2 lb

## 2019-09-09 DIAGNOSIS — I10 Essential (primary) hypertension: Secondary | ICD-10-CM | POA: Diagnosis not present

## 2019-09-09 DIAGNOSIS — Z1211 Encounter for screening for malignant neoplasm of colon: Secondary | ICD-10-CM | POA: Diagnosis not present

## 2019-09-09 DIAGNOSIS — F0280 Dementia in other diseases classified elsewhere without behavioral disturbance: Secondary | ICD-10-CM

## 2019-09-09 DIAGNOSIS — Z23 Encounter for immunization: Secondary | ICD-10-CM | POA: Diagnosis not present

## 2019-09-09 DIAGNOSIS — G4700 Insomnia, unspecified: Secondary | ICD-10-CM | POA: Diagnosis not present

## 2019-09-09 DIAGNOSIS — G301 Alzheimer's disease with late onset: Secondary | ICD-10-CM

## 2019-09-09 DIAGNOSIS — H409 Unspecified glaucoma: Secondary | ICD-10-CM

## 2019-09-09 DIAGNOSIS — H6121 Impacted cerumen, right ear: Secondary | ICD-10-CM

## 2019-09-09 DIAGNOSIS — H6122 Impacted cerumen, left ear: Secondary | ICD-10-CM | POA: Diagnosis not present

## 2019-09-09 DIAGNOSIS — Z780 Asymptomatic menopausal state: Secondary | ICD-10-CM | POA: Diagnosis not present

## 2019-09-09 DIAGNOSIS — R1912 Hyperactive bowel sounds: Secondary | ICD-10-CM

## 2019-09-09 DIAGNOSIS — F329 Major depressive disorder, single episode, unspecified: Secondary | ICD-10-CM | POA: Diagnosis not present

## 2019-09-09 DIAGNOSIS — Z1159 Encounter for screening for other viral diseases: Secondary | ICD-10-CM

## 2019-09-09 DIAGNOSIS — F32A Depression, unspecified: Secondary | ICD-10-CM

## 2019-09-09 MED ORDER — TETANUS-DIPHTH-ACELL PERTUSSIS 5-2-15.5 LF-MCG/0.5 IM SUSP: 0.5000 mL | Freq: Once | INTRAMUSCULAR | 0 refills | Status: AC

## 2019-09-09 NOTE — Progress Notes (Signed)
Provider: Marlowe Sax Knapp   Pam Knapp  Patient Care Team: Pam Knapp as PCP - General (Family Medicine) Saporito, Maree Erie, LCSW as [facility] Pam Pearson, MD as Consulting Physician (Ophthalmology) Pam Knapp, Pam Means, MD (Neurology)  Extended Emergency Contact Information Primary Emergency Contact: Pam Knapp Mobile Phone: [phone] Relation: Daughter Secondary Emergency Contact: Pam Knapp  [place] Phone: [phone] Relation: Grandson  Code Status: Full Code  Goals of care: Advanced Directive information Advanced Directives 04/25/2019  Does Patient Have a Medical Advance Directive? No  Would patient like information on creating a medical advance directive? No - Patient declined     Chief Complaint  Patient presents with  . Medical Management of Chronic Issues    Complains of increased myalgias the last couple of months. Has had hip/knee surgeries. Has seen a rheumatologist. Daughter, Pam Knapp, accompanies patient.   . Quality Metric Gaps    Needs colonoscopy, agreed to Cologuard, form signed.  . Immunizations    Needs tetatus, order previously sent to pharmacy.  Needs PNA.   Marland Kitchen Best Practice Recommendations    Needs hepatitis C screen and DEXA scan.     HPI:  Pt is a 73 y.o. female seen today for medical management of chronic diseases.She states has been away visiting with her daughter out of states.Daughter, Pam Knapp, accompanies patient to visit today.she complains of increased myalgias the last couple of months.Also complains diarrhea sometimes whenever she eats certain foods.she states her stomach makes lots of noises sounds like " gurgling". She denies any abdominal distention,bloating,flatulence or pain.  Hypertension - No home blood pressure readings for evaluation.she states takes her medication as directed.she denies any headache,dizziness,palpitation,chest pain or shortness  of breath.currently on amlodipine 10 mg tablet daily,spironolactone 25 mg tablet daily and doxazosin 4 mg tablet daily.  Depression - states has been doing well.Exercises by doing Yoga one hour daily which has help.on Wellbutrin 150 mg 24 hr tablet daily.  Insomnia -states melatonin 3 mg tablet has been effective.  Hyperlipidemia - no recent labs.Has pending lab orders since march,2020 but has been away with her daughter in [place].   Dementia - seen by Neurologist 09/08/2019 memory worsening.Aricept increased from 5 mg tablet to 10 mg tablet daily.she does puzzles,crosswords and other games on the tablet daily and does Yoga daily for an hour. Also tries to eat healthy. Daughter request home health Nurse to assist daily with patient's medication administration.No other Nursing issues required.  Needs colonoscopy.she prefers Cologuard.form signed today.   Needs tetatus, order previously sent to pharmacy.forgot that Tdap vaccine  was send to pharmacy.will resend today.    Also due for  PNA vaccine.States has never had Prevnair 13 or PNA 23.  Needs hepatitis C screen.she denies any history of blood transfusion or uses of drug abuse.   She is also due for DEXA scan.No fall episode.    Past Medical History:  Diagnosis Date  . ADHD   . Arthritis   . Cataracts, bilateral   . Depression   . Glaucoma   . Hypertension   . Polymyalgia rheumatica (HCC)    Past Surgical History:  Procedure Laterality Date  . PARTIAL HIP ARTHROPLASTY Right 2011  . REPLACEMENT TOTAL KNEE Left 2013   Dr Jodell Cipro    No Known Allergies  Allergies as of 09/09/2019   No Known Allergies     Medication List       Accurate as of September 09, 2019  3:15 PM. If you have any questions, ask your nurse or doctor.        STOP taking these medications   Vitamin D (Ergocalciferol) 1.25 MG (50000 UT) Caps capsule Commonly known as: DRISDOL Stopped by: Sandrea Hughs, Knapp     TAKE these medications    amLODipine 10 MG tablet Commonly known as: NORVASC TAKE 1 TABLET BY MOUTH EVERY DAY   aspirin 325 MG tablet Take 325 mg by mouth as needed.   buPROPion 150 MG 24 hr tablet Commonly known as: WELLBUTRIN XL Take 1 tablet (150 mg total) by mouth daily.   cetirizine 10 MG tablet Commonly known as: ZYRTEC Take 10 mg by mouth as needed for allergies.   Combigan 0.2-0.5 % ophthalmic solution Generic drug: brimonidine-timolol 1 drop every 12 (twelve) hours.   Cranberry-Cholecalciferol 4200-500 MG-UNIT Caps Take 1 capsule by mouth daily.   [facility]S Melatonin 3 MG Tabs Generic drug: Melatonin TAKE 1 TABLET (3 MG TOTAL) BY MOUTH AT BEDTIME. MAY REPEAT X 1 DOSE.   donepezil 10 MG tablet Commonly known as: ARICEPT TAKE 1 TABLET BY MOUTH EVERYDAY AT BEDTIME   doxazosin 4 MG tablet Commonly known as: CARDURA Take 4 mg by mouth daily.   OMEGA 3 PO Take 750 mg by mouth daily.   QC TUMERIC COMPLEX PO Take 15 mLs by mouth daily.   spironolactone 25 MG tablet Commonly known as: ALDACTONE TAKE 1 TABLET BY MOUTH TWICE A DAY   vitamin C 500 MG tablet Commonly known as: ASCORBIC ACID Take 500 mg by mouth daily.   Vitamin D 125 MCG (5000 UT) Caps Take 1 capsule by mouth daily.       Review of Systems  Constitutional: Negative for appetite change, chills, fatigue and fever.  HENT: Positive for hearing loss. Negative for congestion, rhinorrhea, sinus pressure, sinus pain, sneezing, sore throat and trouble swallowing.   Eyes: Positive for visual disturbance. Negative for pain, discharge, redness and itching.       Hx Glaucoma follows up with Ophthalmologist   Respiratory: Negative for cough, chest tightness, shortness of breath and wheezing.   Cardiovascular: Negative for chest pain, palpitations and leg swelling.  Gastrointestinal: Negative for abdominal distention, abdominal pain, constipation, nausea and vomiting.       Occasional diarrhea.increased gurgling sounds   Endocrine:  Negative for cold intolerance, heat intolerance, polydipsia, polyphagia and polyuria.  Genitourinary: Negative for decreased urine volume, difficulty urinating, dysuria, flank pain, frequency and urgency.  Musculoskeletal: Positive for arthralgias and gait problem. Negative for back pain.  Skin: Negative for color change, pallor, rash and wound.  Neurological: Negative for dizziness, speech difficulty, weakness, light-headedness, numbness and headaches.  Hematological: Does not bruise/bleed easily.  Psychiatric/Behavioral: Positive for confusion. Negative for agitation and sleep disturbance. The patient is not nervous/anxious.     Immunization History  Administered Date(s) Administered  . Influenza, High Dose Seasonal PF 07/29/2017, 08/30/2018, 08/14/2019  . Zoster Recombinat (Shingrix) 10/22/2018   Pertinent  Health Maintenance Due  Topic Date Due  . COLONOSCOPY  05/31/1995  . DEXA SCAN  05/30/2010  . PNA vac Low Risk Adult (1 of 2 - PCV13) 05/30/2010  . MAMMOGRAM  10/24/2019  . INFLUENZA VACCINE  Completed   Fall Risk  09/09/2019 04/25/2019 12/06/2018 12/03/2018  Falls in the past year? 0 1 1 1   Number falls in past yr: - 0 0 0  Injury with Fall? - 0 0 0  Risk for fall due to : - - - Impaired  balance/gait;Impaired mobility;Impaired vision;Mental status change  Follow up - - - Education provided;Falls prevention discussed    Vitals:   09/09/19 1443  BP: 130/78  Pulse: 63  Temp: (!) 97.3 F (36.3 C)  TempSrc: Temporal  SpO2: 97%  Weight: 192 lb 3.2 oz (87.2 kg)  Height: 5\' 9"  (1.753 m)   Body mass index is 28.38 kg/m. Physical Exam Vitals signs reviewed.  Constitutional:      General: She is not in acute distress.    Appearance: She is overweight. She is not ill-appearing.  HENT:     Head: Normocephalic.     Right Ear: There is impacted cerumen.     Left Ear: Tympanic membrane, ear canal and external ear normal. There is no impacted cerumen.     Ears:     Comments:  Right ear cerumen lavaged with warm water.large amounts of cerumen obtained.patient tolerated procedure well.No instrument used.     Nose: Nose normal. No congestion or rhinorrhea.     Mouth/Throat:     Mouth: Mucous membranes are moist.     Pharynx: Oropharynx is clear. No oropharyngeal exudate or posterior oropharyngeal erythema.  Eyes:     General: No scleral icterus.       Right eye: No discharge.        Left eye: No discharge.     Extraocular Movements: Extraocular movements intact.     Conjunctiva/sclera: Conjunctivae normal.     Pupils: Pupils are equal, round, and reactive to light.  Neck:     Musculoskeletal: Normal range of motion. No neck rigidity or muscular tenderness.     Vascular: No carotid bruit.  Cardiovascular:     Rate and Rhythm: Normal rate and regular rhythm.     Pulses: Normal pulses.     Heart sounds: Normal heart sounds. No murmur. No friction rub. No gallop.   Pulmonary:     Effort: Pulmonary effort is normal. No respiratory distress.     Breath sounds: Normal breath sounds. No wheezing, rhonchi or rales.  Chest:     Chest wall: No tenderness.  Abdominal:     General: Bowel sounds are increased. There is no distension.     Palpations: Abdomen is soft. There is no mass.     Tenderness: There is no abdominal tenderness. There is no right CVA tenderness, left CVA tenderness, guarding or rebound.     Hernia: No hernia is present.     Comments: Very loud Hyperactive bowel sounds heard over epigastric area.  Musculoskeletal:        General: No swelling or tenderness.     Right lower leg: No edema.     Left lower leg: No edema.     Comments: Gait unsteady   Lymphadenopathy:     Cervical: No cervical adenopathy.  Skin:    General: Skin is warm and dry.     Coloration: Skin is not pale.     Findings: No bruising, erythema, lesion or rash.  Neurological:     Mental Status: She is alert. Mental status is at baseline.     Cranial Nerves: No cranial nerve  deficit.     Sensory: No sensory deficit.     Motor: No weakness.     Gait: Gait abnormal.     Comments: Forgetful   Psychiatric:        Mood and Affect: Mood normal.        Speech: Speech normal.        Behavior:  Behavior normal.        Thought Content: Thought content normal.        Cognition and Memory: Memory is impaired.        Judgment: Judgment normal.    Labs reviewed: Recent Labs    12/10/18 0857  NA 139  K 4.2  CL 104  CO2 28  GLUCOSE 97  BUN 15  CREATININE 0.74  CALCIUM 9.8   Recent Labs    12/10/18 0857  AST 15  ALT 10  BILITOT 0.5  PROT 7.1   Recent Labs    12/10/18 0857  WBC 3.2*  NEUTROABS 1,459*  HGB 12.1  HCT 36.8  MCV 92.0  PLT 166   Lab Results  Component Value Date   TSH 3.170 11/18/2018   Lab Results  Component Value Date   HGBA1C 5.6 12/06/2018   Lab Results  Component Value Date   CHOL 160 12/10/2018   HDL 59 12/10/2018   LDLCALC 86 12/10/2018   TRIG 61 12/10/2018   CHOLHDL 2.7 12/10/2018    Significant Diagnostic Results in last 30 days:  No results found.  Assessment/Plan 1. Essential hypertension No home blood pressure for review.B/p at goal today.continue on amlodipine 10 mg tablet daily,spironolactone 25 mg tablet daily and doxazosin 4 mg tablet daily.continue Yoga exercises and heart healthy diet.   2. Depression, unspecified depression type Mood stable.continue on Wellbutrin 150 mg 24 hr tablet daily  3. Insomnia, unspecified type melatonin 3 mg tablet has been effective.continue with Yoga exercises for relaxation.   4. Late onset Alzheimer's disease without behavioral disturbance (HCC) Progressive decline.continues to live by herself and does own activity of living.daughter reports needs assistance with her medication.Family assist to put medication in pill boxes but sometimes patient forgets to take medication.Request Home health Nurse to administer medication daily but does not have any other nursing needs  does not qualify for HHN.Daughter will arrange for other assistance.   5. Glaucoma of both eyes, unspecified glaucoma type Continue on Combigan drops.continue to follow up with Opthalmopathy.   6. Need for Tdap vaccination Will resend Tdap to her pharmacy.  - Tdap (ADACEL) 02-07-14.5 LF-MCG/0.5 injection; Inject 0.5 mLs into the muscle once for 1 dose.  Dispense: 0.5 mL; Refill: 0  7. Screening for colon cancer Signed form for Cologuard.  8. Encounter for hepatitis C screening test for low risk patient Low risk. - Hep C Antibody; Future  9. Hyperactive bowel sounds Per patient and daughter this is ongoing for about 3 months now.Epigastric audible hyperactive bowel sounds.bowel movements regular but reports diarrhea sometimes.None tender to palpation and none distended. Will have her evaluated by GI as  Soon as possible.  - Ambulatory referral to Gastroenterology  10. Postmenopausal estrogen deficiency No recent falls or fractures.  - DG Bone Density; Future  11. Need for vaccination against Streptococcus pneumoniae using pneumococcal conjugate vaccine 13 Reports never had any PNA vaccine.  - Pneumococcal conjugate vaccine 13-valent  12. Hearing loss of left ear due to cerumen impaction Right ear cerumen impaction.ear lavaged with warm water TM clear.  - Ear Lavage  Family/ staff Communication: Reviewed plan of care with patient and daughter. Labs/tests ordered: None   Dinah C [name], Knapp

## 2019-10-06 ENCOUNTER — Encounter: Payer: Self-pay | Admitting: Gastroenterology

## 2019-10-14 ENCOUNTER — Encounter: Payer: Self-pay | Admitting: Gastroenterology

## 2019-10-14 ENCOUNTER — Ambulatory Visit: Payer: PPO | Admitting: Gastroenterology

## 2019-10-14 VITALS — BP 130/74 | HR 76 | Temp 97.8°F | Ht 68.0 in | Wt 190.0 lb

## 2019-10-14 DIAGNOSIS — Z1159 Encounter for screening for other viral diseases: Secondary | ICD-10-CM

## 2019-10-14 DIAGNOSIS — R194 Change in bowel habit: Secondary | ICD-10-CM | POA: Diagnosis not present

## 2019-10-14 MED ORDER — NA SULFATE-K SULFATE-MG SULF 17.5-3.13-1.6 GM/177ML PO SOLN: 1.0000 | Freq: Once | ORAL | 0 refills | Status: AC

## 2019-10-14 NOTE — Patient Instructions (Addendum)
If you are age 75 or older, your body mass index should be between 23-30. Your Body mass index is 28.89 kg/m. If this is out of the aforementioned range listed, please consider follow up with your Primary Care Provider.  If you are age 47 or younger, your body mass index should be between 19-25. Your Body mass index is 28.89 kg/m. If this is out of the aformentioned range listed, please consider follow up with your Primary Care Provider.   You have been scheduled for a colonoscopy. Please follow written instructions given to you at your visit today.  Please pick up your prep supplies at the pharmacy within the next 1-3 days. If you use inhalers (even only as needed), please bring them with you on the day of your procedure.

## 2019-10-14 NOTE — Progress Notes (Signed)
HPI: This is a very pleasant 74 year old woman who was referred to me by [name], Dinah C, NP  to evaluate change in bowel habits, loud bowel sounds.    She is here with her daughter today.  She has mild dementia with slight memory difficulties.  She is a pretty good medical historian however.  He tells me for the past several months or so her bowels have changed a bit.  They are generally looser than they were previously.  She has felt gassier.  She has also noticed a lot of loud bowel sounds.  She never sees blood in her stool.  She has no serious abdominal pains.  She has intentionally lost about 110 pounds in the past 2 to 3 years.  No family history of colon cancer  Old Data Reviewed:  Most recent blood work was March 2020, CBC was normal      Review of systems: Pertinent positive and negative review of systems were noted in the above HPI section. All other review negative.   Past Medical History:  Diagnosis Date  . ADHD   . Arthritis   . Cataracts, bilateral   . Depression   . Glaucoma   . Hypertension   . Polymyalgia rheumatica (HCC)     Past Surgical History:  Procedure Laterality Date  . PARTIAL HIP ARTHROPLASTY Right 2011  . REPLACEMENT TOTAL KNEE Left 2013   Dr Jodell Cipro    Current Outpatient Medications  Medication Sig Dispense Refill  . amLODipine (NORVASC) 10 MG tablet TAKE 1 TABLET BY MOUTH EVERY DAY 90 tablet 1  . aspirin 325 MG tablet Take 325 mg by mouth as needed.     Marland Kitchen buPROPion (WELLBUTRIN XL) 150 MG 24 hr tablet Take 1 tablet (150 mg total) by mouth daily. 90 tablet 3  . cetirizine (ZYRTEC) 10 MG tablet Take 10 mg by mouth as needed for allergies.    . Cholecalciferol (VITAMIN D) 125 MCG (5000 UT) CAPS Take 1 capsule by mouth daily.    . COMBIGAN 0.2-0.5 % ophthalmic solution 1 drop every 12 (twelve) hours.     . Cranberry-Cholecalciferol 4200-500 MG-UNIT CAPS Take 1 capsule by mouth daily.    . CVS MELATONIN 3 MG TABS TAKE 1 TABLET (3 MG  TOTAL) BY MOUTH AT BEDTIME. MAY REPEAT X 1 DOSE. 90 tablet 1  . donepezil (ARICEPT) 10 MG tablet TAKE 1 TABLET BY MOUTH EVERYDAY AT BEDTIME 90 tablet 3  . doxazosin (CARDURA) 4 MG tablet Take 4 mg by mouth daily.     . Omega-3 Fatty Acids (OMEGA 3 PO) Take 750 mg by mouth daily.    Marland Kitchen spironolactone (ALDACTONE) 25 MG tablet TAKE 1 TABLET BY MOUTH TWICE A DAY 180 tablet 1  . Turmeric (QC TUMERIC COMPLEX PO) Take 15 mLs by mouth daily.    . vitamin C (ASCORBIC ACID) 500 MG tablet Take 500 mg by mouth daily.     No current facility-administered medications for this visit.    Allergies as of 10/14/2019  . (No Known Allergies)    Family History  Problem Relation Age of Onset  . Diabetes Mother   . Kidney failure Mother   . High blood pressure Sister   . Neuropathy Sister   . Heart disease Brother   . Asthma Sister   . Sleep apnea Sister   . Bipolar disorder Sister   . Diabetes Sister   . Diabetes Sister   . Breast cancer Sister   .  Fibromyalgia Daughter     Social History   Socioeconomic History  . Marital status: Widowed    Spouse name: Not on file  . Number of children: 1  . Years of education: BS  . Highest education level: Not on file  Occupational History  . Occupation: Retired  Tobacco Use  . Smoking status: Former Smoker    Packs/day: 0.50    Years: 20.00    Pack years: 10.00  . Smokeless tobacco: Never Used  . Tobacco comment: quit over 20 years ago  Substance and Sexual Activity  . Alcohol use: Yes    Comment: 2-3 drinks a year  . Drug use: Never  . Sexual activity: Not on file  Other Topics Concern  . Not on file  Social History Narrative   Lives alone   Caffeine use: 2 coffees per day   Right handed       Social History      Diet? Regular diet       Do you drink/eat things with caffeine? Yes coffee      Marital status?                widow                    What year were you married? Brady you live in a house, apartment,  assisted living, condo, trailer, etc.? Apartment       Is it one or more stories? one      How many persons live in your home? one      Do you have any pets in your home? (please list) cockatiel       Highest level of education completed? 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    yes                                  Type & how often? Daily chair aerobics - water aerobics       Advanced Directives      Do you have a living will? No       Do you have a DNR form?            no                      If not, do you want to discuss one? yes      Do you have signed POA/HPOA for forms? No       Functional Status      Do you have difficulty bathing or dressing yourself? No       Do you have difficulty preparing food or eating? No       Do you have difficulty managing your medications? yes      Do you have difficulty managing your finances? Yes daughter manages       Do you have difficulty affording your medications? no      Social Determinants of Health   Financial Resource Strain:   . Difficulty of Paying Living Expenses: Not on file  Food Insecurity:   . Worried About Charity fundraiser in the Last Year: Not on file  . Ran Out of Food in the Last Year: Not on file  Transportation Needs:   .  Lack of Transportation (Medical): Not on file  . Lack of Transportation (Non-Medical): Not on file  Physical Activity:   . Days of Exercise per Week: Not on file  . Minutes of Exercise per Session: Not on file  Stress:   . Feeling of Stress : Not on file  Social Connections:   . Frequency of Communication with Friends and Family: Not on file  . Frequency of Social Gatherings with Friends and Family: Not on file  . Attends Religious Services: Not on file  . Active Member of Clubs or Organizations: Not on file  . Attends Archivist Meetings: Not on file  . Marital Status: Not on file  Intimate Partner Violence:   . Fear of Current  or Ex-Partner: Not on file  . Emotionally Abused: Not on file  . Physically Abused: Not on file  . Sexually Abused: Not on file     Physical Exam: BP 130/74   Pulse 76   Temp 97.8 F (36.6 C)   Ht 5\' 8"  (1.727 m)   Wt 190 lb (86.2 kg)   BMI 28.89 kg/m  Constitutional: generally well-appearing Psychiatric: alert and oriented x3 Eyes: extraocular movements intact Mouth: oral pharynx moist, no lesions Neck: supple no lymphadenopathy Cardiovascular: heart regular rate and rhythm Lungs: clear to auscultation bilaterally Abdomen: soft, nontender, nondistended, no obvious ascites, no peritoneal signs, normal bowel sounds Extremities: no lower extremity edema bilaterally Skin: no lesions on visible extremities   Assessment and plan: 74 y.o. female with change in bowel habits, increased bowel sounds  She has noticed a change in her bowel habits over the past several months, they are a bit looser than usual however certainly she is not having significant diarrhea.  She has never had colon cancer screening that she is aware of.  I recommended a colonoscopy at her soonest convenience.  This would be to exclude serious causes of her bowel changes which I think are unlikely.  They certainly might be related to her Alzheimer's medicine.    Please see the "Patient Instructions" section for addition details about the plan.   Owens Loffler, MD [facility] 10/14/2019, 1:39 PM  Cc: Sandrea Hughs, NP

## 2019-10-17 ENCOUNTER — Encounter: Payer: Self-pay | Admitting: Gastroenterology

## 2019-10-23 ENCOUNTER — Other Ambulatory Visit: Payer: Self-pay | Admitting: Gastroenterology

## 2019-10-23 ENCOUNTER — Ambulatory Visit (INDEPENDENT_AMBULATORY_CARE_PROVIDER_SITE_OTHER): Payer: PPO

## 2019-10-23 DIAGNOSIS — Z1159 Encounter for screening for other viral diseases: Secondary | ICD-10-CM

## 2019-10-23 LAB — SARS CORONAVIRUS 2 (TAT 6-24 HRS): SARS Coronavirus 2: NEGATIVE

## 2019-10-27 ENCOUNTER — Ambulatory Visit (AMBULATORY_SURGERY_CENTER): Payer: PPO | Admitting: Gastroenterology

## 2019-10-27 ENCOUNTER — Encounter: Payer: Self-pay | Admitting: Gastroenterology

## 2019-10-27 ENCOUNTER — Other Ambulatory Visit: Payer: Self-pay

## 2019-10-27 VITALS — BP 131/58 | HR 55 | Temp 97.4°F | Resp 16 | Ht 68.0 in | Wt 190.0 lb

## 2019-10-27 DIAGNOSIS — K6389 Other specified diseases of intestine: Secondary | ICD-10-CM

## 2019-10-27 DIAGNOSIS — K5730 Diverticulosis of large intestine without perforation or abscess without bleeding: Secondary | ICD-10-CM

## 2019-10-27 DIAGNOSIS — R194 Change in bowel habit: Secondary | ICD-10-CM

## 2019-10-27 DIAGNOSIS — K635 Polyp of colon: Secondary | ICD-10-CM

## 2019-10-27 DIAGNOSIS — R197 Diarrhea, unspecified: Secondary | ICD-10-CM | POA: Diagnosis not present

## 2019-10-27 DIAGNOSIS — D125 Benign neoplasm of sigmoid colon: Secondary | ICD-10-CM

## 2019-10-27 DIAGNOSIS — Z1211 Encounter for screening for malignant neoplasm of colon: Secondary | ICD-10-CM | POA: Diagnosis not present

## 2019-10-27 MED ORDER — SODIUM CHLORIDE 0.9 % IV SOLN: 500.0000 mL | Freq: Once | INTRAVENOUS | Status: DC

## 2019-10-27 NOTE — Patient Instructions (Signed)
HANDOUTS PROVIDED ON: POLYPS & DIVERTICULOSIS  The polyp removed/biopsies taken today have been sent for pathology.  The results can take 1-3 weeks to receive.  When your next colonoscopy should occur will be based on the pathology results.    You may resume your previous diet and medication schedule.  Thank you for allowing [place] to care for you today!!!  YOU HAD AN ENDOSCOPIC PROCEDURE TODAY AT [facility]:   Refer to the procedure report that was given to you for any specific questions about what was found during the examination.  If the procedure report does not answer your questions, please call your gastroenterologist to clarify.  If you requested that your care partner not be given the details of your procedure findings, then the procedure report has been included in a sealed envelope for you to review at your convenience later.  YOU SHOULD EXPECT: Some feelings of bloating in the abdomen. Passage of more gas than usual.  Walking can help get rid of the air that was put into your GI tract during the procedure and reduce the bloating. If you had a lower endoscopy (such as a colonoscopy or flexible sigmoidoscopy) you may notice spotting of blood in your stool or on the toilet paper. If you underwent a bowel prep for your procedure, you may not have a normal bowel movement for a few days.  Please Note:  You might notice some irritation and congestion in your nose or some drainage.  This is from the oxygen used during your procedure.  There is no need for concern and it should clear up in a day or so.  SYMPTOMS TO REPORT IMMEDIATELY:   Following lower endoscopy (colonoscopy or flexible sigmoidoscopy):  Excessive amounts of blood in the stool  Significant tenderness or worsening of abdominal pains  Swelling of the abdomen that is new, acute  Fever of 100F or higher  For urgent or emergent issues, a gastroenterologist can be reached at any hour by calling [phone]  [phone].   DIET:  We do recommend a small meal at first, but then you may proceed to your regular diet.  Drink plenty of fluids but you should avoid alcoholic beverages for 24 hours.  ACTIVITY:  You should plan to take it easy for the rest of today and you should NOT DRIVE or use heavy machinery until tomorrow (because of the sedation medicines used during the test).    FOLLOW UP: Our staff will call the number listed on your records 48-72 hours following your procedure to check on you and address any questions or concerns that you may have regarding the information given to you following your procedure. If we do not reach you, we will leave a message.  We will attempt to reach you two times.  During this call, we will ask if you have developed any symptoms of COVID 19. If you develop any symptoms (ie: fever, flu-like symptoms, shortness of breath, cough etc.) before then, please call [phone].  If you test positive for Covid 19 in the 2 weeks post procedure, please call and report this information to [place].    If any biopsies were taken you will be contacted by phone or by letter within the next 1-3 weeks.  Please call us at [phone] if you have not heard about the biopsies in 3 weeks.    SIGNATURES/CONFIDENTIALITY: You and/or your care partner have signed paperwork which will be entered into your electronic medical record.  These signatures  attest to the fact that that the information above on your After Visit Summary has been reviewed and is understood.  Full responsibility of the confidentiality of this discharge information lies with you and/or your care-partner.

## 2019-10-27 NOTE — Progress Notes (Signed)
Called to room to assist during endoscopic procedure.  Patient ID and intended procedure confirmed with present staff. Received instructions for my participation in the procedure from the performing physician.  

## 2019-10-27 NOTE — Op Note (Signed)
[facility] Patient Name: Pam Knapp Procedure Date: 10/27/2019 1:15 PM MRN: [id number] Endoscopist: Milus Banister , MD Age: 74 Referring MD:  Date of Birth: 10/05/1946 Gender: Female Account #: [id number] Procedure:                Colonoscopy Indications:              Chronic diarrhea Medicines:                Monitored Anesthesia Care Procedure:                Pre-Anesthesia Assessment:                           - Prior to the procedure, a History and Physical                            was performed, and patient medications and                            allergies were reviewed. The patient's tolerance of                            previous anesthesia was also reviewed. The risks                            and benefits of the procedure and the sedation                            options and risks were discussed with the patient.                            All questions were answered, and informed consent                            was obtained. Prior Anticoagulants: The patient has                            taken no previous anticoagulant or antiplatelet                            agents. ASA Grade Assessment: III - A patient with                            severe systemic disease. After reviewing the risks                            and benefits, the patient was deemed in                            satisfactory condition to undergo the procedure.                           After obtaining informed consent, the colonoscope  was passed under direct vision. Throughout the                            procedure, the patient's blood pressure, pulse, and                            oxygen saturations were monitored continuously. The                            Colonoscope was introduced through the anus and                            advanced to the the terminal ileum. The colonoscopy                            was performed without difficulty.  The patient                            tolerated the procedure well. The quality of the                            bowel preparation was good. The terminal ileum,                            ileocecal valve, appendiceal orifice, and rectum                            were photographed. Scope In: 1:31:47 PM Scope Out: 1:48:52 PM Scope Withdrawal Time: 0 hours 10 minutes 57 seconds  Total Procedure Duration: 0 hours 17 minutes 5 seconds  Findings:                 There were several areas of bluish appearing mucosa                            in the terminal ileum, 1to 3cm across. These were                            sampled with biopsy forceps.                           A 3 mm polyp was found in the sigmoid colon. The                            polyp was sessile. The polyp was removed with a                            cold snare. Resection and retrieval were complete.                           Multiple small and large-mouthed diverticula were                            found in the left colon.  The exam was otherwise without abnormality on                            direct and retroflexion views.                           Biopsies for histology were taken with a cold                            forceps from the entire colon for evaluation of                            microscopic colitis. Complications:            No immediate complications. Estimated blood loss:                            None. Estimated Blood Loss:     Estimated blood loss: none. Impression:               - There were several areas of bluish appearing                            mucosa in the terminal ileum, 1to 3cm across. These                            were sampled with biopsy forceps.                           - One 3 mm polyp in the sigmoid colon, removed with                            a cold snare. Resected and retrieved.                           - Diverticulosis in the left colon.                            - The examination was otherwise normal on direct                            and retroflexion views.                           - Biopsies were taken with a cold forceps from the                            entire colon for evaluation of microscopic colitis. Recommendation:           - Patient has a contact number available for                            emergencies. The signs and symptoms of potential  delayed complications were discussed with the                            patient. Return to normal activities tomorrow.                            Written discharge instructions were provided to the                            patient.                           - Resume previous diet.                           - Continue present medications.                           - Await pathology results. Milus Banister, MD 10/27/2019 1:53:53 PM This report has been signed electronically.

## 2019-10-27 NOTE — Progress Notes (Signed)
To PACU, VSS. Report to [email or web address] 

## 2019-10-29 ENCOUNTER — Telehealth: Payer: Self-pay

## 2019-10-29 NOTE — Telephone Encounter (Signed)
Attempted to reach pt. With follow-up call following endoscopic procedure 10/27/2019.  LM on pt. Answering machine.  Will try to reach pt. Again later today.

## 2019-10-29 NOTE — Telephone Encounter (Signed)
Left message on 2nd follow up call. 

## 2019-11-04 ENCOUNTER — Telehealth: Payer: Self-pay | Admitting: Gastroenterology

## 2019-11-04 NOTE — Telephone Encounter (Signed)
See results note. 

## 2019-11-17 ENCOUNTER — Other Ambulatory Visit: Payer: Self-pay

## 2019-11-17 MED ORDER — SPIRONOLACTONE 25 MG PO TABS: 25.0000 mg | ORAL_TABLET | Freq: Two times a day (BID) | ORAL | 1 refills | Status: DC

## 2019-11-17 MED ORDER — AMLODIPINE BESYLATE 10 MG PO TABS: 10.0000 mg | ORAL_TABLET | Freq: Every day | ORAL | 1 refills | Status: DC

## 2019-11-17 MED ORDER — DOXAZOSIN MESYLATE 4 MG PO TABS: 4.0000 mg | ORAL_TABLET | Freq: Every day | ORAL | 0 refills | Status: DC

## 2019-11-17 NOTE — Telephone Encounter (Signed)
Patient daughter "[name]" called and left voicemail stating that [facility] [address] is having issues sending over prescription refills for Amlodipine, Donepezil, and Spironolactone. Medications pend and sent to provider. Please Advise.

## 2019-11-18 ENCOUNTER — Other Ambulatory Visit: Payer: Self-pay

## 2019-11-18 ENCOUNTER — Other Ambulatory Visit: Payer: PPO

## 2019-11-18 DIAGNOSIS — Z6828 Body mass index (BMI) 28.0-28.9, adult: Secondary | ICD-10-CM

## 2019-11-18 DIAGNOSIS — Z1159 Encounter for screening for other viral diseases: Secondary | ICD-10-CM | POA: Diagnosis not present

## 2019-11-18 DIAGNOSIS — F329 Major depressive disorder, single episode, unspecified: Secondary | ICD-10-CM

## 2019-11-18 DIAGNOSIS — F32A Depression, unspecified: Secondary | ICD-10-CM

## 2019-11-18 DIAGNOSIS — I10 Essential (primary) hypertension: Secondary | ICD-10-CM

## 2019-11-19 ENCOUNTER — Encounter: Payer: Self-pay | Admitting: *Deleted

## 2019-11-19 LAB — COMPLETE METABOLIC PANEL WITH GFR
AG Ratio: 1.3 (calc) (ref 1.0–2.5)
ALT: 8 U/L (ref 6–29)
AST: 15 U/L (ref 10–35)
Albumin: 4.2 g/dL (ref 3.6–5.1)
Alkaline phosphatase (APISO): 47 U/L (ref 37–153)
BUN: 9 mg/dL (ref 7–25)
CO2: 25 mmol/L (ref 20–32)
Calcium: 9.9 mg/dL (ref 8.6–10.4)
Chloride: 105 mmol/L (ref 98–110)
Creat: 0.87 mg/dL (ref 0.60–0.93)
GFR, Est African American: 76 mL/min/{1.73_m2} (ref >=60)
GFR, Est Non African American: 66 mL/min/{1.73_m2} (ref >=60)
Globulin: 3.3 g/dL (calc) (ref 1.9–3.7)
Glucose, Bld: 98 mg/dL (ref 65–99)
Potassium: 4.2 mmol/L (ref 3.5–5.3)
Sodium: 139 mmol/L (ref 135–146)
Total Bilirubin: 0.5 mg/dL (ref 0.2–1.2)
Total Protein: 7.5 g/dL (ref 6.1–8.1)

## 2019-11-19 LAB — LIPID PANEL
Cholesterol: 197 mg/dL (ref <200)
HDL: 57 mg/dL (ref >=50)
LDL Cholesterol (Calc): 121 mg/dL (calc) — ABNORMAL HIGH
Non-HDL Cholesterol (Calc): 140 mg/dL (calc) — ABNORMAL HIGH (ref <130)
Total CHOL/HDL Ratio: 3.5 (calc) (ref <5.0)
Triglycerides: 86 mg/dL (ref <150)

## 2019-11-19 LAB — CBC WITH DIFFERENTIAL/PLATELET
Absolute Monocytes: 409 cells/uL (ref 200–950)
Basophils Absolute: 31 cells/uL (ref 0–200)
Basophils Relative: 0.7 %
Eosinophils Absolute: 141 cells/uL (ref 15–500)
Eosinophils Relative: 3.2 %
HCT: 39.4 % (ref 35.0–45.0)
Hemoglobin: 13.3 g/dL (ref 11.7–15.5)
Lymphs Abs: 1175 cells/uL (ref 850–3900)
MCH: 32.1 pg (ref 27.0–33.0)
MCHC: 33.8 g/dL (ref 32.0–36.0)
MCV: 95.2 fL (ref 80.0–100.0)
MPV: 10 fL (ref 7.5–12.5)
Monocytes Relative: 9.3 %
Neutro Abs: 2644 cells/uL (ref 1500–7800)
Neutrophils Relative %: 60.1 %
Platelets: 164 10*3/uL (ref 140–400)
RBC: 4.14 10*6/uL (ref 3.80–5.10)
RDW: 12.1 % (ref 11.0–15.0)
Total Lymphocyte: 26.7 %
WBC: 4.4 10*3/uL (ref 3.8–10.8)

## 2019-11-19 LAB — HEPATITIS C ANTIBODY
Hepatitis C Ab: NONREACTIVE
SIGNAL TO CUT-OFF: 0.04 (ref <1.00)

## 2019-11-19 LAB — TSH: TSH: 2.26 mIU/L (ref 0.40–4.50)

## 2019-11-20 ENCOUNTER — Encounter: Payer: Self-pay | Admitting: *Deleted

## 2019-11-28 ENCOUNTER — Other Ambulatory Visit: Payer: Self-pay | Admitting: Family

## 2019-11-28 DIAGNOSIS — Z1231 Encounter for screening mammogram for malignant neoplasm of breast: Secondary | ICD-10-CM

## 2019-12-22 ENCOUNTER — Other Ambulatory Visit: Payer: Self-pay

## 2019-12-22 ENCOUNTER — Ambulatory Visit (INDEPENDENT_AMBULATORY_CARE_PROVIDER_SITE_OTHER): Payer: PPO | Admitting: Family

## 2019-12-22 ENCOUNTER — Encounter: Payer: Self-pay | Admitting: Family

## 2019-12-22 VITALS — BP 112/70 | HR 55 | Temp 97.9°F | Resp 18 | Ht 68.0 in | Wt 181.0 lb

## 2019-12-22 DIAGNOSIS — H259 Unspecified age-related cataract: Secondary | ICD-10-CM | POA: Diagnosis not present

## 2019-12-22 DIAGNOSIS — J302 Other seasonal allergic rhinitis: Secondary | ICD-10-CM | POA: Diagnosis not present

## 2019-12-22 DIAGNOSIS — R2681 Unsteadiness on feet: Secondary | ICD-10-CM

## 2019-12-22 DIAGNOSIS — G301 Alzheimer's disease with late onset: Secondary | ICD-10-CM | POA: Diagnosis not present

## 2019-12-22 DIAGNOSIS — H409 Unspecified glaucoma: Secondary | ICD-10-CM | POA: Diagnosis not present

## 2019-12-22 DIAGNOSIS — Z7189 Other specified counseling: Secondary | ICD-10-CM

## 2019-12-22 DIAGNOSIS — F0280 Dementia in other diseases classified elsewhere without behavioral disturbance: Secondary | ICD-10-CM

## 2019-12-22 NOTE — Progress Notes (Signed)
Provider: [name]   [name], [name], NP  Patient Care Team: [name], [name], NP as PCP - General (Family Medicine) Saporito, Maree Erie, LCSW as [place] Management [name], MD as Consulting Physician (Ophthalmology) Felecia Shelling, Nanine Means, MD (Neurology)  Extended Emergency Contact Information Primary Emergency Contact: [name] Address: [place] [address] [name] Work Phone: [phone] Mobile Phone: [phone] Relation: Daughter Secondary Emergency Contact: [name],[name]  [place] Phone: [phone] Relation: Grandson  Code Status: Full Code  Goals of care: Advanced Directive information Advanced Directives 04/25/2019  Does Patient Have a Medical Advance Directive? No  Would patient like information on creating a medical advance directive? No - Patient declined     Chief Complaint  Patient presents with  . Medical Management of Chronic Issues    Discuss home health or personal care aid    HPI:  Pt is a 74 y.o. female seen today to discuss home health referral for a personal care aid.she is here with her daughter [name] from [place].Patient states has had worsening vision due to cataract and Glaucoma in both eyes unable to cook and has difficulty picking out her clothes.she gets meals on wheels services which has helped a lot. She is able to take a shower but her gait is unsteady due to her chronic right hip pain and left knee pain.states tries to be cautious.she walks using a cane.daughter states water aerobics was helping with patient's pain but has not been able to attend due to COVID-19 restrictions.she lives with her Yolanda Bonine but he recently got a full time job unable to assist with patient's activity of daily living.daughter states spoke with patient's insurance agent and was told insurance might cover for an assistant to help with patient's care.  She  would also like her DVM placard filled.states unable to walk more than 200 feet due to her right hip and left knee pain.   She has completed her COVID-19 vaccine. No reaction reported.  Goals of care discussed with patient and daughter.she states in case of a cardiopulmonary event if she has no pulse and not breathing,she would like cardiac Pulmonary resuscitation performed (CPR).she would also like medical intervention Full scope of treatment such use intubation,advance airway interventions,mechanical ventilation,cardioversions as indicated,medical treatment ,I.V fluids etc.Also provide comfort measures.Transfer to hospital if indicated.she would like antibiotic if life can be prolonged and I.V fluids for long-term if indicated.Also would like feeding tube for defined trial period if indicated.MOST form has been filled out today. Reviewed goals of care and filled MOST. Answered question/ concern from daughter and patient.  Past Medical History:  Diagnosis Date  . ADHD   . Arthritis   . Cataracts, bilateral   . Dementia ([place])   . Depression   . Glaucoma   . Hypertension   . Polymyalgia rheumatica (HCC)    Past Surgical History:  Procedure Laterality Date  . PARTIAL HIP ARTHROPLASTY Right 2011  . REPLACEMENT TOTAL KNEE Left 2013   Dr Jodell Cipro    No Known Allergies  Allergies as of 12/22/2019   No Known Allergies     Medication List       Accurate as of December 22, 2019 10:51 AM. If you have any questions, ask your nurse or doctor.        amLODipine 10 MG tablet Commonly known as: NORVASC Take 1 tablet (10 mg total) by  mouth daily.   aspirin 325 MG tablet Take 325 mg by mouth as needed.   buPROPion 150 MG 24 hr tablet Commonly known as: WELLBUTRIN XL Take 1 tablet (150 mg total) by mouth daily.   cetirizine 10 MG tablet Commonly known as: ZYRTEC Take 10 mg by mouth as needed for allergies.   Combigan 0.2-0.5 % ophthalmic solution Generic drug: brimonidine-timolol 1  drop every 12 (twelve) hours.   Cranberry-Cholecalciferol 4200-500 MG-UNIT Caps Take 1 capsule by mouth daily.   [facility] Melatonin 3 MG Tabs Generic drug: Melatonin TAKE 1 TABLET (3 MG TOTAL) BY MOUTH AT BEDTIME. MAY REPEAT X 1 DOSE.   donepezil 10 MG tablet Commonly known as: ARICEPT TAKE 1 TABLET BY MOUTH EVERYDAY AT BEDTIME   doxazosin 4 MG tablet Commonly known as: CARDURA Take 1 tablet (4 mg total) by mouth daily.   OMEGA 3 PO Take 750 mg by mouth daily.   QC TUMERIC COMPLEX PO Take 15 mLs by mouth daily.   spironolactone 25 MG tablet Commonly known as: ALDACTONE Take 1 tablet (25 mg total) by mouth 2 (two) times daily.   vitamin C 500 MG tablet Commonly known as: ASCORBIC ACID Take 500 mg by mouth daily.   Vitamin D 125 MCG (5000 UT) Caps Take 1 capsule by mouth daily.       Review of Systems  Constitutional: Negative for appetite change, chills, fatigue and fever.  HENT: Positive for rhinorrhea. Negative for congestion, postnasal drip, sinus pressure, sinus pain, sneezing and sore throat.   Eyes: Positive for visual disturbance. Negative for discharge, redness and itching.       Bilateral cataract and Glaucoma follows up with Ophthalmology   Respiratory: Negative for cough, chest tightness, shortness of breath and wheezing.   Cardiovascular: Negative for chest pain, palpitations and leg swelling.  Gastrointestinal: Negative for abdominal distention, abdominal pain, constipation, diarrhea, nausea and vomiting.  Endocrine: Negative for polydipsia, polyphagia and polyuria.  Musculoskeletal: Positive for arthralgias and gait problem. Negative for joint swelling and myalgias.       Right hip and left knee pain   Skin: Negative for color change, pallor and rash.  Neurological: Negative for dizziness, speech difficulty, weakness, light-headedness, numbness and headaches.  Hematological: Does not bruise/bleed easily.  Psychiatric/Behavioral: Negative for agitation and  sleep disturbance. The patient is not nervous/anxious.        Forgetful     Immunization History  Administered Date(s) Administered  . Influenza, High Dose Seasonal PF 07/29/2017, 08/30/2018, 08/14/2019  . PFIZER SARS-COV-2 Vaccination 11/20/2019, 12/15/2019  . Pneumococcal Conjugate-13 09/09/2019  . Zoster Recombinat (Shingrix) 10/22/2018   Pertinent  Health Maintenance Due  Topic Date Due  . DEXA SCAN  Never done  . MAMMOGRAM  10/24/2019  . PNA vac Low Risk Adult (2 of 2 - PPSV23) 09/08/2020  . COLONOSCOPY  10/26/2029  . INFLUENZA VACCINE  Completed   Fall Risk  12/22/2019 09/09/2019 04/25/2019 12/06/2018 12/03/2018  Falls in the past year? 0 0 1 1 1   Number falls in past yr: 0 - 0 0 0  Injury with Fall? 0 - 0 0 0  Risk for fall due to : - - - - Impaired balance/gait;Impaired mobility;Impaired vision;Mental status change  Follow up - - - - Education provided;Falls prevention discussed    Vitals:   12/22/19 1030  BP: 112/70  Pulse: (!) 55  Temp: 97.9 F (36.6 C)  TempSrc: Tympanic  SpO2: 98%  Weight: 181 lb (82.1 kg)  Height: 5'  8" (1.727 m)   Body mass index is 27.52 kg/m. Physical Exam Vitals reviewed.  Constitutional:      General: She is not in acute distress.    Appearance: She is overweight. She is not ill-appearing.  HENT:     Head: Normocephalic.     Right Ear: Tympanic membrane, ear canal and external ear normal. There is no impacted cerumen.     Left Ear: Tympanic membrane, ear canal and external ear normal. There is no impacted cerumen.     Nose: Rhinorrhea present. No congestion.     Mouth/Throat:     Mouth: Mucous membranes are moist.     Pharynx: Oropharynx is clear. No oropharyngeal exudate or posterior oropharyngeal erythema.  Eyes:     General: No scleral icterus.       Right eye: No discharge.        Left eye: No discharge.     Extraocular Movements: Extraocular movements intact.     Conjunctiva/sclera: Conjunctivae normal.     Pupils: Pupils  are equal, round, and reactive to light.     Comments: Corrective lens in place   Neck:     Vascular: No carotid bruit.  Cardiovascular:     Rate and Rhythm: Normal rate and regular rhythm.     Pulses: Normal pulses.     Heart sounds: Normal heart sounds. No murmur. No friction rub. No gallop.   Pulmonary:     Breath sounds: Normal breath sounds. No wheezing, rhonchi or rales.  Chest:     Chest wall: No tenderness.  Abdominal:     General: Bowel sounds are normal. There is no distension.     Palpations: Abdomen is soft. There is no mass.     Tenderness: There is no abdominal tenderness. There is no right CVA tenderness, left CVA tenderness, guarding or rebound.  Musculoskeletal:        General: No swelling or tenderness.     Cervical back: Normal range of motion. No rigidity or tenderness.     Right knee: No swelling, deformity or erythema. Normal range of motion.     Left knee: Deformity present. No swelling or erythema. Normal range of motion. No tenderness.     Right lower leg: No edema.     Left lower leg: No edema.     Comments: Unsteady gait ambulates with walker.left knee inversion   Lymphadenopathy:     Cervical: No cervical adenopathy.  Skin:    General: Skin is warm and dry.     Coloration: Skin is not pale.     Findings: No bruising or erythema.  Neurological:     Mental Status: She is alert. Mental status is at baseline.     Cranial Nerves: No cranial nerve deficit.     Sensory: No sensory deficit.     Motor: No weakness.     Coordination: Coordination normal.     Gait: Gait abnormal.  Psychiatric:        Mood and Affect: Mood normal.        Speech: Speech normal.        Behavior: Behavior normal.        Thought Content: Thought content normal.        Cognition and Memory: Memory is impaired.    Labs reviewed: Recent Labs    11/18/19 0852  NA 139  K 4.2  CL 105  CO2 25  GLUCOSE 98  BUN 9  CREATININE 0.87  CALCIUM 9.9  Recent Labs    11/18/19  0852  AST 15  ALT 8  BILITOT 0.5  PROT 7.5   Recent Labs    11/18/19 0852  WBC 4.4  NEUTROABS 2,644  HGB 13.3  HCT 39.4  MCV 95.2  PLT 164   Lab Results  Component Value Date   TSH 2.26 11/18/2019   Lab Results  Component Value Date   HGBA1C 5.6 12/06/2018   Lab Results  Component Value Date   CHOL 197 11/18/2019   HDL 57 11/18/2019   LDLCALC 121 (H) 11/18/2019   TRIG 86 11/18/2019   CHOLHDL 3.5 11/18/2019    Significant Diagnostic Results in last 30 days:  No results found.  Assessment/Plan 1. Age-related cataract of both eyes, unspecified age-related cataract type Continue to current eye drops and follow up with Ophthalmology as directed. - Fall and safety precautions - Ambulatory referral to [facility] for Nurse Assistance to assist with Activity of daily Living.   2. Late onset Alzheimer's disease without behavioral disturbance ([place]) No new behavioral issues reported.Needs assistance with her ADL's will refer to [facility] for Nurse Aids. - continue on Aricept 10 mg tablet daily  - Ambulatory referral to [facility]  3. Glaucoma of both eyes, unspecified glaucoma type Worsening vision.follow up with Ophthalmology as directed.  - Ambulatory referral to [facility]  4. Unsteady gait - Fall and safety precaution advised.No recent fall episodes.patient cautious.requires assistance with her ADL's  - Ambulatory referral to [facility] for Home health Nurse Aids to assist with ADL's.  5. ACP (advance care planning) Goals of care discussed with patient and daughter.she states in case of a cardiopulmonary event if she has no pulse and not breathing,she would like cardiac Pulmonary resuscitation performed (CPR).she would also like medical intervention Full scope of treatment such use intubation,advance airway interventions,mechanical ventilation,cardioversions as indicated,medical treatment ,I.V fluids etc.Also provide comfort measures.Transfer to hospital if indicated.she  would like antibiotic if life can be prolonged and I.V fluids for long-term if indicated.Also would like feeding tube for defined trial period if indicated.MOST form has been filled out today. Reviewed goals of care and filled MOST form between 11:20-11:50 am. Answered question/ concern from daughter and patient to the best of my knowledge.Original copy of MOST form and addition copy   given to patient and daughter. - Advised to place MOST form where visible in the house for ease access for care givers in case of an emergency.    6. Seasonal allergies Reports runny nose clear drainage noted.Advised to take Zyrtec 10 mg tablet daily during spring season.    Family/ staff Communication: Reviewed plan of care with patient and daughter verbalized understanding.   Labs/tests ordered: None   Next Appointment : 3 months for medical managemenet of chronic issues.   Sandrea Hughs, NP

## 2019-12-22 NOTE — Patient Instructions (Signed)
-   Home health ordered placed for Nurse assistance Agency will call you for appointment. - MOST form completed today please place form where it's visible  in case of an emergency   - continue on current medication

## 2019-12-23 ENCOUNTER — Ambulatory Visit (INDEPENDENT_AMBULATORY_CARE_PROVIDER_SITE_OTHER): Payer: PPO | Admitting: Gastroenterology

## 2019-12-23 ENCOUNTER — Encounter: Payer: Self-pay | Admitting: Gastroenterology

## 2019-12-23 VITALS — BP 102/60 | HR 62 | Temp 97.9°F | Ht 68.0 in | Wt 182.0 lb

## 2019-12-23 DIAGNOSIS — R194 Change in bowel habit: Secondary | ICD-10-CM

## 2019-12-23 NOTE — Progress Notes (Signed)
Review of pertinent gastrointestinal problems: 1.  Change in bowel habits (looser than usual) led to eventual colonoscopy January 2021.  Some bluish areas in the terminal ileum were sampled, single colon polyp.  Biopsies were taken for microscopic colitis as well.  The biopsies from the terminal ileum showed Peyer's patches, the polyp was hyperplastic and the random colon biopsies showed no evidence of microscopic colitis.  I recommended she take a single Imodium every day.   HPI: This is a very pleasant 74 year old woman whom I last saw the time of a colonoscopy about 2 months ago.  She is here with her daughter today.  She started a single Imodium a day and is very happy with her response.  On that regimen she has more solid stools and she has much more control of her bowels.  She is very happy overall about this.  Blood work February 2021 showed normal CBC, normal complete metabolic profile.   ROS: complete GI ROS as described in HPI, all other review negative.  Constitutional:  No unintentional weight loss   Past Medical History:  Diagnosis Date  . ADHD   . Arthritis   . Cataracts, bilateral   . Dementia ([place])   . Depression   . Glaucoma   . Hypertension   . Polymyalgia rheumatica (HCC)     Past Surgical History:  Procedure Laterality Date  . PARTIAL HIP ARTHROPLASTY Right 2011  . REPLACEMENT TOTAL KNEE Left 2013   Dr Jodell Cipro    Current Outpatient Medications  Medication Sig Dispense Refill  . amLODipine (NORVASC) 10 MG tablet Take 1 tablet (10 mg total) by mouth daily. 90 tablet 1  . aspirin 325 MG tablet Take 325 mg by mouth as needed.     Marland Kitchen buPROPion (WELLBUTRIN XL) 150 MG 24 hr tablet Take 1 tablet (150 mg total) by mouth daily. 90 tablet 3  . cetirizine (ZYRTEC) 10 MG tablet Take 10 mg by mouth as needed for allergies.    . Cholecalciferol (VITAMIN D) 125 MCG (5000 UT) CAPS Take 1 capsule by mouth daily.    . COMBIGAN 0.2-0.5 % ophthalmic solution 1 drop every 12  (twelve) hours.     . Cranberry-Cholecalciferol 4200-500 MG-UNIT CAPS Take 1 capsule by mouth daily.    . CVS MELATONIN 3 MG TABS TAKE 1 TABLET (3 MG TOTAL) BY MOUTH AT BEDTIME. MAY REPEAT X 1 DOSE. 90 tablet 1  . donepezil (ARICEPT) 10 MG tablet TAKE 1 TABLET BY MOUTH EVERYDAY AT BEDTIME 90 tablet 3  . doxazosin (CARDURA) 4 MG tablet Take 1 tablet (4 mg total) by mouth daily. 90 tablet 0  . Omega-3 Fatty Acids (OMEGA 3 PO) Take 750 mg by mouth daily.    Marland Kitchen spironolactone (ALDACTONE) 25 MG tablet Take 1 tablet (25 mg total) by mouth 2 (two) times daily. 180 tablet 1  . Turmeric (QC TUMERIC COMPLEX PO) Take 15 mLs by mouth daily.    . vitamin C (ASCORBIC ACID) 500 MG tablet Take 500 mg by mouth daily.     No current facility-administered medications for this visit.    Allergies as of 12/23/2019  . (No Known Allergies)    Family History  Problem Relation Age of Onset  . Diabetes Mother   . Kidney failure Mother   . High blood pressure Sister   . Neuropathy Sister   . Heart disease Brother   . Asthma Sister   . Sleep apnea Sister   . Bipolar disorder Sister   .  Diabetes Sister   . Diabetes Sister   . Breast cancer Sister   . Fibromyalgia Daughter   . Colon cancer Other   . Stomach cancer Other   . Rectal cancer Other   . Esophageal cancer Other     Social History   Socioeconomic History  . Marital status: Widowed    Spouse name: Not on file  . Number of children: 1  . Years of education: BS  . Highest education level: Not on file  Occupational History  . Occupation: Retired  Tobacco Use  . Smoking status: Former Smoker    Packs/day: 0.50    Years: 20.00    Pack years: 10.00  . Smokeless tobacco: Never Used  . Tobacco comment: quit over 20 years ago  Substance and Sexual Activity  . Alcohol use: Yes    Comment: 2-3 drinks a year  . Drug use: Never  . Sexual activity: Not on file  Other Topics Concern  . Not on file  Social History Narrative   Lives alone    Caffeine use: 2 coffees per day   Right handed       Social History      Diet? Regular diet       Do you drink/eat things with caffeine? Yes coffee      Marital status?                widow                    What year were you married? [place] you live in a house, apartment, assisted living, condo, trailer, etc.? Apartment       Is it one or more stories? one      How many persons live in your home? one      Do you have any pets in your home? (please list) cockatiel       Highest level of education completed? 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    yes                                  Type & how often? Daily chair aerobics - water aerobics       Advanced Directives      Do you have a living will? No       Do you have a DNR form?            no                      If not, do you want to discuss one? yes      Do you have signed POA/HPOA for forms? No       Functional Status      Do you have difficulty bathing or dressing yourself? No       Do you have difficulty preparing food or eating? No       Do you have difficulty managing your medications? yes      Do you have difficulty managing your finances? Yes daughter manages       Do you have difficulty affording your medications? no      Social Determinants of Health   Financial Resource Strain:   . Difficulty of Paying Living Expenses:  Food Insecurity:   . Worried About Charity fundraiser in the Last Year:   . Arboriculturist in the Last Year:   Transportation Needs:   . Film/video editor (Medical):   Marland Kitchen Lack of Transportation (Non-Medical):   Physical Activity:   . Days of Exercise per Week:   . Minutes of Exercise per Session:   Stress:   . Feeling of Stress :   Social Connections:   . Frequency of Communication with Friends and Family:   . Frequency of Social Gatherings with Friends and Family:   . Attends Religious Services:   . Active  Member of Clubs or Organizations:   . Attends Archivist Meetings:   Marland Kitchen Marital Status:   Intimate Partner Violence:   . Fear of Current or Ex-Partner:   . Emotionally Abused:   Marland Kitchen Physically Abused:   . Sexually Abused:      Physical Exam: BP 102/60   Pulse 62   Temp 97.9 F (36.6 C)   Ht 5\' 8"  (1.727 m)   Wt 182 lb (82.6 kg)   BMI 27.67 kg/m  Constitutional: generally well-appearing Psychiatric: alert and oriented x3 Abdomen: soft, nontender, nondistended, no obvious ascites, no peritoneal signs, normal bowel sounds No peripheral edema noted in lower extremities  Assessment and plan: 74 y.o. female with change in bowel habits, improved  A single Imodium once daily shortly after she wakes up has really helped her.  She will continue this indefinitely knows to call if she has any further questions or concerns.  Please see the "Patient Instructions" section for addition details about the plan.  Owens Loffler, MD [facility] 12/23/2019, 2:08 PM   Total time on date of encounter was 15 minutes (this included time spent preparing to see the patient reviewing records; obtaining and/or reviewing separately obtained history; performing a medically appropriate exam and/or evaluation; counseling and educating the patient and family if present; ordering medications, tests or procedures if applicable; and documenting clinical information in the health record).

## 2019-12-23 NOTE — Patient Instructions (Signed)
Please follow up with Dr Ardis Hughs as needed.  It was great seeing you today!  If you are age 75 or older, your body mass index should be between 23-30. Your Body mass index is 27.67 kg/m. If this is out of the aforementioned range listed, please consider follow up with your Primary Care Provider.  If you are age 44 or younger, your body mass index should be between 19-25. Your Body mass index is 27.67 kg/m. If this is out of the aformentioned range listed, please consider follow up with your Primary Care Provider.

## 2020-01-01 DIAGNOSIS — H2513 Age-related nuclear cataract, bilateral: Secondary | ICD-10-CM | POA: Diagnosis not present

## 2020-01-01 DIAGNOSIS — H402212 Chronic angle-closure glaucoma, right eye, moderate stage: Secondary | ICD-10-CM | POA: Diagnosis not present

## 2020-01-01 DIAGNOSIS — H401132 Primary open-angle glaucoma, bilateral, moderate stage: Secondary | ICD-10-CM | POA: Diagnosis not present

## 2020-01-01 DIAGNOSIS — H04123 Dry eye syndrome of bilateral lacrimal glands: Secondary | ICD-10-CM | POA: Diagnosis not present

## 2020-02-04 ENCOUNTER — Telehealth: Payer: Self-pay | Admitting: Family

## 2020-02-04 DIAGNOSIS — M353 Polymyalgia rheumatica: Secondary | ICD-10-CM

## 2020-02-04 NOTE — Telephone Encounter (Signed)
Pts daughter(Pam Knapp)  called to make appt with pts rheum Dr Kathi Ludwig. They told her that Ms Pam Knapp would need another referral since she had not been seen since 2014.  Pt has complaining with joint pain & stiffness.  Thanks, Vilinda Blanks

## 2020-02-04 NOTE — Telephone Encounter (Signed)
Referral placed.

## 2020-02-06 ENCOUNTER — Telehealth: Payer: Self-pay

## 2020-02-06 NOTE — Telephone Encounter (Signed)
I called patient to inquire if she had sent her Cologuard back to lab first time phone just rang and I got her answering machine left a message called a second time patient answered and I asked her about the Cologuard and she said she didn't know that her daughter took care of these type things for her as she has dementia I asked if she would remember to have her daughter to call the office and we could explain it to her

## 2020-02-09 NOTE — Telephone Encounter (Signed)
Daughter stated that while waiting on the Cologuard box patient already saw a GI specialist and had a Colonoscopy done. Stated there was no need in doing the Cologuard also.

## 2020-02-10 ENCOUNTER — Other Ambulatory Visit: Payer: Self-pay | Admitting: Family

## 2020-02-10 NOTE — Telephone Encounter (Signed)
rx sent to pharmacy by e-script  

## 2020-02-19 ENCOUNTER — Ambulatory Visit: Payer: PPO

## 2020-02-19 ENCOUNTER — Other Ambulatory Visit: Payer: PPO

## 2020-03-03 ENCOUNTER — Ambulatory Visit: Payer: PPO | Admitting: Family Medicine

## 2020-03-03 ENCOUNTER — Encounter: Payer: Self-pay | Admitting: Family Medicine

## 2020-03-03 ENCOUNTER — Other Ambulatory Visit: Payer: Self-pay

## 2020-03-03 VITALS — BP 121/68 | HR 65 | Ht 68.0 in | Wt 182.0 lb

## 2020-03-03 DIAGNOSIS — F0280 Dementia in other diseases classified elsewhere without behavioral disturbance: Secondary | ICD-10-CM | POA: Diagnosis not present

## 2020-03-03 DIAGNOSIS — G301 Alzheimer's disease with late onset: Secondary | ICD-10-CM

## 2020-03-03 DIAGNOSIS — F329 Major depressive disorder, single episode, unspecified: Secondary | ICD-10-CM | POA: Diagnosis not present

## 2020-03-03 DIAGNOSIS — F32A Depression, unspecified: Secondary | ICD-10-CM

## 2020-03-03 MED ORDER — MEMANTINE HCL 5 MG PO TABS
5.0000 mg | ORAL_TABLET | Freq: Two times a day (BID) | ORAL | 11 refills | Status: DC
Start: 2020-03-03 — End: 2020-09-06

## 2020-03-03 NOTE — Progress Notes (Signed)
I have read the note, and I agree with the clinical assessment and plan.  Richard A. Sater, MD, PhD, FAAN Certified in Neurology, Clinical Neurophysiology, Sleep Medicine, Pain Medicine and Neuroimaging  [facility] [address], [address] [place], [place] [address] [phone]  

## 2020-03-03 NOTE — Progress Notes (Signed)
PATIENT: Pam Knapp DOB: 03-Sep-1946  REASON FOR VISIT: follow up HISTORY FROM: patient  Chief Complaint  Patient presents with  . Follow-up    Rm 1 here for a f/u on alzheimers.     HISTORY OF PRESENT ILLNESS: Today 03/03/20 [name] is a 74 y.o. female here today for follow up for dementia. She is feels that she is doing well. She feels good and is staying active. She has tolerated increased dose of donepezil. She has continues bupropion as well. Her daughter feels that these medications have been helpful. She lives alone. She is able perform ADL's independently. She is eating normally. She drinks plenty of fluids. Her grandson assists her with medication. She does not drive. Her daughter lives out of state. They face time regularly. PCP placed referral for home health eval. She exercises regularly. No falls. She does use a cane for long distances.   HISTORY: (copied from Dr Garth Bigness note on 09/03/2019)  She has AD and started donepezil 5 mg earlier this year.   She feels she is doing better.   Her daughter got her a Arts development officer calendar and she feels her orientation is better.    She is doing better with her mood, both the mild depression and anxiety.  She is on Wellbutrin once a day..  She tries to keep up with sister daily and friends over the phone.   She is physically active.  [facility] and misses the social aspect of exercise.     She had visual hallucinations earlier this year but they no longer has these.   She talks in her sleep and occasionally moves her arm (like swatting a fly) as she talks but does not kick or scream or get out of bed.    She has no bradykinesia, tremor and gait is stable with a reasonably good stride. . Because her gait is mildly well-balanced, she uses a cane when outdoors.    Update 02/18/19: She feel her memory is about the same as last visit. She felt memory worsened after being placed on Vyvanse (she stopped).   She  had visual hallucinations at that time but none recently.      Labs and MRI were ok.    She has glaucoma and cataracts.    There is no change in her gait.   She has mild urinary urgency.  She has been diagnosed with ADD (cognitive testing in 2018) and feels her focus/attention is worse.  Repeat testing (Dr. Sima Matas).  I reviewed his comprehensive neuropsychologic evaluation performed in March.  Besides her memory and attentional issues.  She also showed difficulties with visual-spatial and visual organizational abilities.  There were milder deficits with auditory encoding abilities.  He felt she most likely has Alzheimer's dementia and moderate recurrent depression.  I discussed these findings with [name] and her daughter.  She notes mild depression but no anxiety.  The daughter notes she is more frustrated.   She feels a little worse with Covid as she is not socializing any.   She is sleeping well most nights.   She does not snore.  I personally reviewed the images from the 11/20/2018 MRI and looked at the interpreting physicians notes.  The MRI of the brain shows shows minimal age-appropriate atrophy and minimal chronic microvascular ischemic changes  11/18/2018 HISTORY OF PRESENT ILLNESS:  Had the pleasure seeing a patient, Pam Knapp, at [facility] for neurologic consultation regarding her  visual disturbance.  She also has had difficulties with memory.     She is a 74 y.o. woman who notices reduced vision when she looks to one side or the other.    She hasn't noted as much change with looking up or down.  She first noted problems with vision almos a year ago but it worsened more recently.   Dr. Venetia Maxon noted an altitudinal visual defect.    She has occasionally noted diplopia.  As an example, when she saw 2 pills she commented to her daughter that it looked like there were four.   Of note, she has had cataract surgery and has glaucoma.      She has had difficulty  with short term memory the past 2 years.   Initially, she was just forgetful and would misplace items but she is doing much worse now.    She was diagnosed as having attention deficit disorder many years ago.  She was placed on Vyvanse last year.    She did not think it helped and she had sleepwalking and mild visual hallucinations on it.  It was stopped recently.     She is going to be seeing psychology for formal neurocognitive testing this week.       She notes no change in her walking.  She trips occasionally.       [facility] Cognitive Assessment  09/03/2019 09/03/2019 11/18/2018  Visuospatial/ Executive (0/5) 1 1 2   Naming (0/3) 2 2 2   Attention: Read list of digits (0/2) 2 1 2   Attention: Read list of letters (0/1) 1 1 1   Attention: Serial 7 subtraction starting at 100 (0/3) 0 1 1  Language: Repeat phrase (0/2) 1 1 1   Language : Fluency (0/1) 0 0 0  Abstraction (0/2) 2 2 2   Delayed Recall (0/5) 0 0 0  Orientation (0/6) 4 4 5   Total 13 13 16   Adjusted Score (based on education)         REVIEW OF SYSTEMS: Out of a complete 14 system review of symptoms, the patient complains only of the following symptoms, memory loss and all other reviewed systems are negative.  ALLERGIES: No Known Allergies  HOME MEDICATIONS: Outpatient Medications Prior to Visit  Medication Sig Dispense Refill  . amLODipine (NORVASC) 10 MG tablet Take 1 tablet (10 mg total) by mouth daily. 90 tablet 1  . aspirin 325 MG tablet Take 325 mg by mouth as needed.     Marland Kitchen buPROPion (WELLBUTRIN XL) 150 MG 24 hr tablet Take 1 tablet (150 mg total) by mouth daily. 90 tablet 3  . cetirizine (ZYRTEC) 10 MG tablet Take 10 mg by mouth as needed for allergies.    . Cholecalciferol (VITAMIN D) 125 MCG (5000 UT) CAPS Take 1 capsule by mouth daily.    . COMBIGAN 0.2-0.5 % ophthalmic solution 1 drop every 12 (twelve) hours.     . Cranberry-Cholecalciferol 4200-500 MG-UNIT CAPS Take 1 capsule by mouth daily.    . CVS MELATONIN  3 MG TABS TAKE 1 TABLET (3 MG TOTAL) BY MOUTH AT BEDTIME. MAY REPEAT X 1 DOSE. 90 tablet 1  . donepezil (ARICEPT) 10 MG tablet TAKE 1 TABLET BY MOUTH EVERYDAY AT BEDTIME 90 tablet 3  . doxazosin (CARDURA) 4 MG tablet TAKE 1 TABLET BY MOUTH EVERY DAY 90 tablet 0  . Omega-3 Fatty Acids (OMEGA 3 PO) Take 750 mg by mouth daily.    Marland Kitchen spironolactone (ALDACTONE) 25 MG tablet Take 1 tablet (25 mg  total) by mouth 2 (two) times daily. 180 tablet 1  . Turmeric (QC TUMERIC COMPLEX PO) Take 15 mLs by mouth daily.    . vitamin C (ASCORBIC ACID) 500 MG tablet Take 500 mg by mouth daily.     No facility-administered medications prior to visit.    PAST MEDICAL HISTORY: Past Medical History:  Diagnosis Date  . ADHD   . Arthritis   . Cataracts, bilateral   . Dementia ([place])   . Depression   . Glaucoma   . Hypertension   . Polymyalgia rheumatica ([place])     PAST SURGICAL HISTORY: Past Surgical History:  Procedure Laterality Date  . PARTIAL HIP ARTHROPLASTY Right 2011  . REPLACEMENT TOTAL KNEE Left 2013   Dr Jodell Cipro    FAMILY HISTORY: Family History  Problem Relation Age of Onset  . Diabetes Mother   . Kidney failure Mother   . High blood pressure Sister   . Neuropathy Sister   . Heart disease Brother   . Asthma Sister   . Sleep apnea Sister   . Bipolar disorder Sister   . Diabetes Sister   . Diabetes Sister   . Breast cancer Sister   . Fibromyalgia Daughter   . Colon cancer Other   . Stomach cancer Other   . Rectal cancer Other   . Esophageal cancer Other     SOCIAL HISTORY: Social History   Socioeconomic History  . Marital status: Widowed    Spouse name: Not on file  . Number of children: 1  . Years of education: BS  . Highest education level: Not on file  Occupational History  . Occupation: Retired  Tobacco Use  . Smoking status: Former Smoker    Packs/day: 0.50    Years: 20.00    Pack years: 10.00  . Smokeless tobacco: Never Used  . Tobacco comment: quit over 20  years ago  Substance and Sexual Activity  . Alcohol use: Yes    Comment: 2-3 drinks a year  . Drug use: Never  . Sexual activity: Not on file  Other Topics Concern  . Not on file  Social History Narrative   Lives alone   Caffeine use: 2 coffees per day   Right handed       Social History      Diet? Regular diet       Do you drink/eat things with caffeine? Yes coffee      Marital status?                widow                    What year were you married? [place] you live in a house, apartment, assisted living, condo, trailer, etc.? Apartment       Is it one or more stories? one      How many persons live in your home? one      Do you have any pets in your home? (please list) cockatiel       Highest level of education completed? 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    yes                                  Type & how often? Daily chair  aerobics - water aerobics       Advanced Directives      Do you have a living will? No       Do you have a DNR form?            no                      If not, do you want to discuss one? yes      Do you have signed POA/HPOA for forms? No       Functional Status      Do you have difficulty bathing or dressing yourself? No       Do you have difficulty preparing food or eating? No       Do you have difficulty managing your medications? yes      Do you have difficulty managing your finances? Yes daughter manages       Do you have difficulty affording your medications? no      Social Determinants of Health   Financial Resource Strain:   . Difficulty of Paying Living Expenses:   Food Insecurity:   . Worried About Charity fundraiser in the Last Year:   . Arboriculturist in the Last Year:   Transportation Needs:   . Film/video editor (Medical):   Marland Kitchen Lack of Transportation (Non-Medical):   Physical Activity:   . Days of Exercise per Week:   . Minutes of Exercise per  Session:   Stress:   . Feeling of Stress :   Social Connections:   . Frequency of Communication with Friends and Family:   . Frequency of Social Gatherings with Friends and Family:   . Attends Religious Services:   . Active Member of Clubs or Organizations:   . Attends Archivist Meetings:   Marland Kitchen Marital Status:   Intimate Partner Violence:   . Fear of Current or Ex-Partner:   . Emotionally Abused:   Marland Kitchen Physically Abused:   . Sexually Abused:       PHYSICAL EXAM  Vitals:   03/03/20 1011  BP: 121/68  Pulse: 65  Weight: 182 lb (82.6 kg)  Height: 5\' 8"  (1.727 m)   Body mass index is 27.67 kg/m.  Generalized: Well developed, in no acute distress  Cardiology: normal rate and rhythm, no murmur noted Respiratory: clear to auscultation bilaterally  Neurological examination  Mentation: Alert oriented to time, place, most history taking. Follows all commands speech and language fluent Cranial nerve II-XII: Pupils were equal round reactive to light. Extraocular movements were full, visual field were full on confrontational test. Facial sensation and strength were normal. Uvula tongue midline. Head turning and shoulder shrug  were normal and symmetric. Motor: The motor testing reveals 5 over 5 strength of all 4 extremities. Good symmetric motor tone is noted throughout.  Sensory: Sensory testing is intact to soft touch on all 4 extremities. No evidence of extinction is noted.  Coordination: Cerebellar testing reveals good finger-nose-finger and heel-to-shin bilaterally.  Gait and station: Gait is normal.   [facility] Cognitive Assessment  03/03/2020 09/03/2019 09/03/2019 11/18/2018  Visuospatial/ Executive (0/5) 3 1 1 2   Naming (0/3) 2 2 2 2   Attention: Read list of digits (0/2) 1 1 1 2   Attention: Read list of letters (0/1) 1 1 1 1   Attention: Serial 7 subtraction starting at 100 (0/3) 1 1 1 1   Language: Repeat phrase (0/2) 2 1 1 1   Language :  Fluency (0/1) 1 0 0 0    Abstraction (0/2) 1 2 2 2   Delayed Recall (0/5) 0 0 0 0  Orientation (0/6) 4 4 4 5   Total 16 13 13 16   Adjusted Score (based on education) - - 14 17    DIAGNOSTIC DATA (LABS, IMAGING, TESTING) - I reviewed patient records, labs, notes, testing and imaging myself where available.  No flowsheet data found.   Lab Results  Component Value Date   WBC 4.4 11/18/2019   HGB 13.3 11/18/2019   HCT 39.4 11/18/2019   MCV 95.2 11/18/2019   PLT 164 11/18/2019      Component Value Date/Time   NA 139 11/18/2019 0852   K 4.2 11/18/2019 0852   CL 105 11/18/2019 0852   CO2 25 11/18/2019 0852   GLUCOSE 98 11/18/2019 0852   BUN 9 11/18/2019 0852   CREATININE 0.87 11/18/2019 0852   CALCIUM 9.9 11/18/2019 0852   PROT 7.5 11/18/2019 0852   ALBUMIN 3.9 11/17/2009 1457   AST 15 11/18/2019 0852   ALT 8 11/18/2019 0852   ALKPHOS 77 11/17/2009 1457   BILITOT 0.5 11/18/2019 0852   GFRNONAA 66 11/18/2019 0852   GFRAA 76 11/18/2019 0852   Lab Results  Component Value Date   CHOL 197 11/18/2019   HDL 57 11/18/2019   LDLCALC 121 (H) 11/18/2019   TRIG 86 11/18/2019   CHOLHDL 3.5 11/18/2019   Lab Results  Component Value Date   HGBA1C 5.6 12/06/2018   Lab Results  Component Value Date   [id number] 11/18/2018   Lab Results  Component Value Date   TSH 2.26 11/18/2019       ASSESSMENT AND PLAN 74 y.o. year old female  has a past medical history of ADHD, Arthritis, Cataracts, bilateral, Dementia ([place]), Depression, Glaucoma, Hypertension, and Polymyalgia rheumatica ([place]). here with     ICD-10-CM   1. Late onset Alzheimer's disease without behavioral disturbance (HCC)  G30.1    F02.80   2. Depression, unspecified depression type  F32.9     [place] is doing very well today.  She is tolerating Aricept 10 mg bupropion 150 mg daily.  Memory seems stable.  MOCA previously 13 and score 16 today.  I have discussed option of adding Namenda to her current regimen.  She and her daughter  both in agreement with this plan.  We will add 5 mg daily for 2 weeks then increase to 5 mg twice daily.  Side effects reviewed and additional educational material provided in AVS.  She was encouraged to continue healthy lifestyle habits.  Adequate hydration and regular exercise encouraged.  She will continue close follow-up with primary care.  We will see her back in 6 months, sooner if needed.  She verbalizes understanding and agreement with this plan.   No orders of the defined types were placed in this encounter.    Meds ordered this encounter  Medications  . memantine (NAMENDA) 5 MG tablet    Sig: Take 1 tablet (5 mg total) by mouth 2 (two) times daily.    Dispense:  60 tablet    Refill:  11    Order Specific Question:   Supervising Provider    Answer:   Melvenia Beam [id number]      I spent 30 minutes with the patient. 50% of this time was spent counseling and educating patient on plan of care and medications.    Debbora Presto, FNP-C 03/03/2020, 11:36 AM [facility] [id number]  [address] [place], [place] [address] [phone]

## 2020-03-03 NOTE — Patient Instructions (Signed)
We will continue Aricept 10mg  daily and Wellbutrin 150mg  daily. We will add Namenda (memantine) 5mg  twice daily. Start 5mg  daily for 2 weeks then increase dose to 5mg  twice daily if well tolerated.   Please stay active. Stay well hydrated. Continue regular exercise.   Continue close follow up with PCP.   Follow up with me in 6 months    Memory Compensation Strategies  1. Use "WARM" strategy.  W= write it down  A= associate it  R= repeat it  M= make a mental note  2.   You can keep a Social worker.  Use a 3-ring notebook with sections for the following: calendar, important names and phone numbers,  medications, doctors' names/phone numbers, lists/reminders, and a section to journal what you did  each day.   3.    Use a calendar to write appointments down.  4.    Write yourself a schedule for the day.  This can be placed on the calendar or in a separate section of the Memory Notebook.  Keeping a  regular schedule can help memory.  5.    Use medication organizer with sections for each day or morning/evening pills.  You may need help loading it  6.    Keep a basket, or pegboard by the door.  Place items that you need to take out with you in the basket or on the pegboard.  You may also want to  include a message board for reminders.  7.    Use sticky notes.  Place sticky notes with reminders in a place where the task is performed.  For example: " turn off the  stove" placed by the stove, "lock the door" placed on the door at eye level, " take your medications" on  the bathroom mirror or by the place where you normally take your medications.  8.    Use alarms/timers.  Use while cooking to remind yourself to check on food or as a reminder to take your medicine, or as a  reminder to make a call, or as a reminder to perform another task, etc.   Memantine Tablets What is this medicine? MEMANTINE (MEM an teen) is used to treat dementia caused by Alzheimer's disease. This medicine  may be used for other purposes; ask your health care provider or pharmacist if you have questions. COMMON BRAND NAME(S): Namenda What should I tell my health care provider before I take this medicine? They need to know if you have any of these conditions:  difficulty passing urine  kidney disease  liver disease  seizures  an unusual or allergic reaction to memantine, other medicines, foods, dyes, or preservatives  pregnant or trying to get pregnant  breast-feeding How should I use this medicine? Take this medicine by mouth with a glass of water. Follow the directions on the prescription label. You may take this medicine with or without food. Take your doses at regular intervals. Do not take your medicine more often than directed. Continue to take your medicine even if you feel better. Do not stop taking except on the advice of your doctor or health care professional. Talk to your pediatrician regarding the use of this medicine in children. Special care may be needed. Overdosage: If you think you have taken too much of this medicine contact a poison control center or emergency room at once. NOTE: This medicine is only for you. Do not share this medicine with others. What if I miss a dose? If you miss  a dose, take it as soon as you can. If it is almost time for your next dose, take only that dose. Do not take double or extra doses. If you do not take your medicine for several days, contact your health care provider. Your dose may need to be changed. What may interact with this medicine?  acetazolamide  amantadine  cimetidine  dextromethorphan  dofetilide  hydrochlorothiazide  ketamine  metformin  methazolamide  quinidine  ranitidine  sodium bicarbonate  triamterene This list may not describe all possible interactions. Give your health care provider a list of all the medicines, herbs, non-prescription drugs, or dietary supplements you use. Also tell them if you smoke,  drink alcohol, or use illegal drugs. Some items may interact with your medicine. What should I watch for while using this medicine? Visit your doctor or health care professional for regular checks on your progress. Check with your doctor or health care professional if there is no improvement in your symptoms or if they get worse. You may get drowsy or dizzy. Do not drive, use machinery, or do anything that needs mental alertness until you know how this drug affects you. Do not stand or sit up quickly, especially if you are an older patient. This reduces the risk of dizzy or fainting spells. Alcohol can make you more drowsy and dizzy. Avoid alcoholic drinks. What side effects may I notice from receiving this medicine? Side effects that you should report to your doctor or health care professional as soon as possible:  allergic reactions like skin rash, itching or hives, swelling of the face, lips, or tongue  agitation or a feeling of restlessness  depressed mood  dizziness  hallucinations  redness, blistering, peeling or loosening of the skin, including inside the mouth  seizures  vomiting Side effects that usually do not require medical attention (report to your doctor or health care professional if they continue or are bothersome):  constipation  diarrhea  headache  nausea  trouble sleeping This list may not describe all possible side effects. Call your doctor for medical advice about side effects. You may report side effects to FDA at 1-800-FDA-1088. Where should I keep my medicine? Keep out of the reach of children. Store at room temperature between 15 degrees and 30 degrees C (59 degrees and 86 degrees F). Throw away any unused medicine after the expiration date. NOTE: This sheet is a summary. It may not cover all possible information. If you have questions about this medicine, talk to your doctor, pharmacist, or health care provider.  2020 Elsevier/Gold Standard (2013-07-14  14:10:42)

## 2020-03-09 ENCOUNTER — Telehealth: Payer: Self-pay

## 2020-03-09 NOTE — Telephone Encounter (Signed)
Pam Knapp,please check with Home health referral ordered in [place] then update patient daughter.thank You.

## 2020-03-09 NOTE — Telephone Encounter (Signed)
Daughter would like to follow up on home health for her mom, a referral was put in on/02/04/20 but the family or daughter has not been contacted for an appointment. Please advise? Nadara Eaton is in town this week only and would like find out some information asap

## 2020-03-09 NOTE — Telephone Encounter (Signed)
I have apologized for the delay & lack of HH not providing care. I have refaxed referral to [facility] 03/09/20.   Due to short staff & ins issues are the answers I get daily from [facility]. They are taking these on a "case by case" basis.  Thanks, Lattie Haw

## 2020-03-10 ENCOUNTER — Telehealth: Payer: Self-pay | Admitting: *Deleted

## 2020-03-10 NOTE — Telephone Encounter (Signed)
I called patient to update her about the referral, I left a voice message for her to call back

## 2020-03-10 NOTE — Telephone Encounter (Signed)
LMVM for daughter, [name] to return call about FMLA form.

## 2020-03-11 NOTE — Telephone Encounter (Signed)
Nadara Eaton just called and said that [facility] did call, however she missed it and will call back.

## 2020-03-16 NOTE — Telephone Encounter (Addendum)
I spoke to daughter of pt. 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  She is only child, has son, grandson lives here.  She was seeing her mother every 2 months, 2-3 wks at a time.   We have not had issue like this come up before about this situation of her living in [place].   relayed that per AL/NP can approve upto 4 wks per calendar year (work remotely when off).  Will have MR contact when ready. She verbalized understanding and was appreciative.

## 2020-05-05 ENCOUNTER — Other Ambulatory Visit: Payer: Self-pay | Admitting: Family

## 2020-05-05 ENCOUNTER — Other Ambulatory Visit: Payer: Self-pay | Admitting: Internal Medicine

## 2020-05-25 NOTE — Progress Notes (Signed)
Office Visit Note  Patient: Pam Knapp             Date of Birth: Sep 18, 1946           MRN: [id number]             PCP: Sandrea Hughs, NP Referring: Sandrea Hughs, NP Visit Date: 06/04/2020 Occupation: _0 @  Subjective:  Neck pain.   History of Present Illness: Pam Knapp is a 74 y.o. female seen in consultation per request of her PCP.  Patient was initially seen by me in 2002 at the time she was complaining of hip and knee joint pain.  She was diagnosed with osteoarthritis in both hips and knee joints and underwent right total hip replacement and left knee joint replacement.  She was again seen by me few years later and was diagnosed with polymyalgia rheumatica.  She went into remission and her last visit with me was in July 2014.  At the time she was in remission.  She also had history of positive ANA positive Ro antibodies.  She had no clinical features of autoimmune disease.  She had history of disc disease of cervical and lumbar spine which was causing increased pain at the time.  She was also diagnosed with myofascial pain syndrome.  She returns today accompanied by her daughter.  She states she has been experiencing increased neck pain and stiffness.  She also experiences some tingling in her hands.  None of the other joints are painful.  She continues to have some dry eye symptoms.  He denies any history of joint swelling.  Activities of Daily Living:  Patient reports morning stiffness for 5-10 minutes.   Patient Reports nocturnal pain.  Difficulty dressing/grooming: Reports Difficulty climbing stairs: Denies Difficulty getting out of chair: Denies Difficulty using hands for taps, buttons, cutlery, and/or writing: Reports  Review of Systems  Constitutional: Negative for fatigue.  HENT: Negative for mouth sores, mouth dryness and nose dryness.   Eyes: Positive for dryness. Negative for itching.  Respiratory: Negative for shortness of breath and  difficulty breathing.   Cardiovascular: Negative for chest pain and palpitations.  Gastrointestinal: Positive for diarrhea. Negative for blood in stool and constipation.  Endocrine: Negative for increased urination.  Genitourinary: Negative for difficulty urinating.  Musculoskeletal: Positive for arthralgias, joint pain, myalgias, morning stiffness and myalgias. Negative for joint swelling and muscle tenderness.  Skin: Negative for color change, rash and redness.  Allergic/Immunologic: Negative for susceptible to infections.  Neurological: Positive for numbness, parasthesias and memory loss. Negative for dizziness, headaches and weakness.  Hematological: Negative for bruising/bleeding tendency.  Psychiatric/Behavioral: Negative for depressed mood. The patient is not nervous/anxious.     PMFS History:  Patient Active Problem List   Diagnosis Date Noted  . Alzheimers disease ([place]) 02/18/2019  . Polymyalgia rheumatica ([place]) 12/06/2018  . Body mass index 28.0-28.9, adult 12/06/2018  . Essential hypertension 12/06/2018  . Memory loss 11/18/2018  . Visual disturbance 11/18/2018  . Attention deficit 11/18/2018  . Depression 11/18/2018    Past Medical History:  Diagnosis Date  . ADHD   . Arthritis   . Cataracts, bilateral   . Dementia ([place])   . Depression   . Glaucoma   . Hypertension   . Polymyalgia rheumatica (H[facility])     Family History  Problem Relation Age of Onset  . Diabetes Mother   . Kidney failure Mother   . High blood pressure Sister   . Neuropathy Sister   .  Heart disease Brother   . Asthma Sister   . Sleep apnea Sister   . Bipolar disorder Sister   . Diabetes Sister   . Diabetes Sister   . Breast cancer Sister   . Fibromyalgia Daughter   . Colon cancer Other   . Stomach cancer Other   . Rectal cancer Other   . Esophageal cancer Other    Past Surgical History:  Procedure Laterality Date  . PARTIAL HIP ARTHROPLASTY Right 2011  . REPLACEMENT TOTAL KNEE Left  2013   Dr Jodell Cipro   Social History   Social History Narrative   Lives alone   Caffeine use: 2 coffees per day   Right handed       Social History      Diet? Regular diet       Do you drink/eat things with caffeine? Yes coffee      Marital status?                widow                    What year were you married? [place] you live in a house, apartment, assisted living, condo, trailer, etc.? Apartment       Is it one or more stories? one      How many persons live in your home? one      Do you have any pets in your home? (please list) cockatiel       Highest level of education completed? 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    yes                                  Type & how often? Daily chair aerobics - water aerobics       Advanced Directives      Do you have a living will? No       Do you have a DNR form?            no                      If not, do you want to discuss one? yes      Do you have signed POA/HPOA for forms? No       Functional Status      Do you have difficulty bathing or dressing yourself? No       Do you have difficulty preparing food or eating? No       Do you have difficulty managing your medications? yes      Do you have difficulty managing your finances? Yes daughter manages       Do you have difficulty affording your medications? no      Immunization History  Administered Date(s) Administered  . Influenza, High Dose Seasonal PF 07/29/2017, 08/30/2018, 08/14/2019  . PFIZER SARS-COV-2 Vaccination 11/20/2019, 12/15/2019  . Pneumococcal Conjugate-13 09/09/2019  . Zoster Recombinat (Shingrix) 10/22/2018     Objective: Vital Signs: BP 137/75 (BP Location: Right Arm, Patient Position: Sitting, Cuff Size: Normal)   Pulse 71   Resp 15   Ht 5' 7" (1.702 m)   Wt 181 lb (82.1 kg)   BMI 28.35 kg/m    Physical Exam Vitals and nursing note  reviewed.  Constitutional:      Appearance:  She is well-developed.  HENT:     Head: Normocephalic and atraumatic.  Eyes:     Conjunctiva/sclera: Conjunctivae normal.  Cardiovascular:     Rate and Rhythm: Normal rate and regular rhythm.     Heart sounds: Normal heart sounds.  Pulmonary:     Effort: Pulmonary effort is normal.     Breath sounds: Normal breath sounds.  Abdominal:     General: Bowel sounds are normal.     Palpations: Abdomen is soft.  Musculoskeletal:     Cervical back: Normal range of motion.  Lymphadenopathy:     Cervical: No cervical adenopathy.  Skin:    General: Skin is warm and dry.     Capillary Refill: Capillary refill takes less than 2 seconds.  Neurological:     Mental Status: She is alert and oriented to person, place, and time.  Psychiatric:        Behavior: Behavior normal.      Musculoskeletal Exam: She has limited painful range of motion of her cervical spine.  She has some spasm in the trapezius region.  Shoulder joints, elbow joints, wrist joints MCPs PIPs and DIPs with good range of motion.  She has DIP and PIP thickening in her hands.  Her right hip joint is replaced and her left knee joint is replaced.  There was no synovitis.  There was no swelling over her ankles or MTPs or PIPs.  CDAI Exam: CDAI Score: -- Patient Global: --; Provider Global: -- Swollen: --; Tender: -- Joint Exam 06/04/2020   No joint exam has been documented for this visit   There is currently no information documented on the homunculus. Go to the Rheumatology activity and complete the homunculus joint exam.  Investigation: No additional findings.  Imaging: XR Cervical Spine 2 or 3 views  Result Date: 06/04/2020 The level of spondylosis and facet joint arthropathy was noted.  Severe narrowing between C3-C4 C4-C5 C5-C6 and C6-C7 was noted.  Anterior osteophytes were noted. Impression: These findings are consistent with multilevel spondylosis and facet joint arthropathy.   Recent Labs: Lab Results  Component  Value Date   WBC 4.4 11/18/2019   HGB 13.3 11/18/2019   PLT 164 11/18/2019   NA 139 11/18/2019   K 4.2 11/18/2019   CL 105 11/18/2019   CO2 25 11/18/2019   GLUCOSE 98 11/18/2019   BUN 9 11/18/2019   CREATININE 0.87 11/18/2019   BILITOT 0.5 11/18/2019   ALKPHOS 77 11/17/2009   AST 15 11/18/2019   ALT 8 11/18/2019   PROT 7.5 11/18/2019   ALBUMIN 3.9 11/17/2009   CALCIUM 9.9 11/18/2019   GFRAA 76 11/18/2019   04/17/2013 CBC normal, ESR 18, CMP normal, ANA 1: 160 speckled, TSH normal, UA negative 09/30/2010 ESR 58, RF negative, ANA 1: 160, RF negative, anti-CCP negative, ENA negative except anti-Ro antibody positive  Speciality Comments: No specialty comments available.  Procedures:  No procedures performed Allergies: Patient has no known allergies.   Assessment / Plan:     Visit Diagnoses: Neck pain-she has been experiencing pain and discomfort in her cervical region.  She has limited range of motion.  X-ray of cervical spine 2 views were obtained today.  Which were consistent with multilevel spondylosis, anterior osteophytes and facet joint arthropathy.  X-ray findings were discussed with the patient and her daughter.  She complains of numbness in her bilateral hands.  I will refer her to physical therapy.  A  handout on neck exercises was given.  Paresthesia of both hands-she complains of numbness in her hands.  I will schedule nerve conduction velocity to evaluate this further.  History of total hip replacement, right-doing well.  History of total knee replacement, left-doing well.  Dry eyes -she had history of dry eyes and positive Ro antibody.  Her symptoms are not very prominent.  She has no other clinical features of autoimmune disease.  Positive ANA, positive Ro  Polymyalgia rheumatica (HCC)-she was diagnosed with polymyalgia rheumatica many years ago and was treated with prednisone.  She is in remission.  Records are reviewed from 2002.  Essential hypertension  Late  onset Alzheimer's disease without behavioral disturbance ([place])  Visual disturbance  Attention deficit   She is fully vaccinated against COVID-19.  Use of mask, social distancing and hand hygiene was emphasized.  Orders: Orders Placed This Encounter  Procedures  . XR Cervical Spine 2 or 3 views  . ANA  . RNP Antibody  . Anti-Smith antibody  . Sjogrens syndrome-A extractable nuclear antibody  . Sjogrens syndrome-B extractable nuclear antibody  . Anti-DNA antibody, double-stranded  . C3 and C4  . Ambulatory referral to Physical Medicine Rehab  . Ambulatory referral to Physical Therapy   No orders of the defined types were placed in this encounter.     Follow-Up Instructions: Return for Osteoarthritis, degenerative disc disease.   Bo Merino, MD  Note - This record has been created using Editor, commissioning.  Chart creation errors have been sought, but may not always  have been located. Such creation errors do not reflect on  the standard of medical care.

## 2020-06-04 ENCOUNTER — Encounter: Payer: Self-pay | Admitting: Rheumatology

## 2020-06-04 ENCOUNTER — Ambulatory Visit: Payer: PPO | Admitting: Rheumatology

## 2020-06-04 ENCOUNTER — Ambulatory Visit: Payer: Self-pay

## 2020-06-04 ENCOUNTER — Other Ambulatory Visit: Payer: Self-pay

## 2020-06-04 VITALS — BP 137/75 | HR 71 | Resp 15 | Ht 67.0 in | Wt 181.0 lb

## 2020-06-04 DIAGNOSIS — R202 Paresthesia of skin: Secondary | ICD-10-CM | POA: Diagnosis not present

## 2020-06-04 DIAGNOSIS — H539 Unspecified visual disturbance: Secondary | ICD-10-CM | POA: Diagnosis not present

## 2020-06-04 DIAGNOSIS — Z96652 Presence of left artificial knee joint: Secondary | ICD-10-CM

## 2020-06-04 DIAGNOSIS — H04123 Dry eye syndrome of bilateral lacrimal glands: Secondary | ICD-10-CM | POA: Diagnosis not present

## 2020-06-04 DIAGNOSIS — M353 Polymyalgia rheumatica: Secondary | ICD-10-CM | POA: Diagnosis not present

## 2020-06-04 DIAGNOSIS — I10 Essential (primary) hypertension: Secondary | ICD-10-CM | POA: Diagnosis not present

## 2020-06-04 DIAGNOSIS — R41840 Attention and concentration deficit: Secondary | ICD-10-CM

## 2020-06-04 DIAGNOSIS — Z96641 Presence of right artificial hip joint: Secondary | ICD-10-CM | POA: Diagnosis not present

## 2020-06-04 DIAGNOSIS — F0280 Dementia in other diseases classified elsewhere without behavioral disturbance: Secondary | ICD-10-CM

## 2020-06-04 DIAGNOSIS — G301 Alzheimer's disease with late onset: Secondary | ICD-10-CM

## 2020-06-04 DIAGNOSIS — M542 Cervicalgia: Secondary | ICD-10-CM

## 2020-06-04 NOTE — Patient Instructions (Signed)

## 2020-06-07 LAB — SJOGRENS SYNDROME-A EXTRACTABLE NUCLEAR ANTIBODY: SSA (Ro) (ENA) Antibody, IgG: 1.7 AI — AB

## 2020-06-07 LAB — C3 AND C4
C3 Complement: 110 mg/dL (ref 83–193)
C4 Complement: 23 mg/dL (ref 15–57)

## 2020-06-07 LAB — ANTI-NUCLEAR AB-TITER (ANA TITER): ANA Titer 1: 1:80 {titer} — ABNORMAL HIGH

## 2020-06-07 LAB — ANA: Anti Nuclear Antibody (ANA): POSITIVE — AB

## 2020-06-07 LAB — RNP ANTIBODY: Ribonucleic Protein(ENA) Antibody, IgG: <1 AI

## 2020-06-07 LAB — SJOGRENS SYNDROME-B EXTRACTABLE NUCLEAR ANTIBODY: SSB (La) (ENA) Antibody, IgG: <1 AI

## 2020-06-07 LAB — ANTI-DNA ANTIBODY, DOUBLE-STRANDED: ds DNA Ab: <1 IU/mL

## 2020-06-07 LAB — ANTI-SMITH ANTIBODY: ENA SM Ab Ser-aCnc: <1 AI

## 2020-06-07 NOTE — Progress Notes (Signed)
I will discuss results at the fu visit.

## 2020-06-08 ENCOUNTER — Telehealth: Payer: Self-pay | Admitting: Physical Medicine and Rehabilitation

## 2020-06-08 NOTE — Telephone Encounter (Signed)
Pt called stating she is still interested in getting an appt with Dr.Newton  [phone]

## 2020-06-10 DIAGNOSIS — H43812 Vitreous degeneration, left eye: Secondary | ICD-10-CM | POA: Diagnosis not present

## 2020-06-10 DIAGNOSIS — H5333 Simultaneous visual perception without fusion: Secondary | ICD-10-CM | POA: Diagnosis not present

## 2020-06-10 DIAGNOSIS — H2513 Age-related nuclear cataract, bilateral: Secondary | ICD-10-CM | POA: Diagnosis not present

## 2020-06-10 DIAGNOSIS — H04123 Dry eye syndrome of bilateral lacrimal glands: Secondary | ICD-10-CM | POA: Diagnosis not present

## 2020-06-10 DIAGNOSIS — H402212 Chronic angle-closure glaucoma, right eye, moderate stage: Secondary | ICD-10-CM | POA: Diagnosis not present

## 2020-06-10 DIAGNOSIS — H401132 Primary open-angle glaucoma, bilateral, moderate stage: Secondary | ICD-10-CM | POA: Diagnosis not present

## 2020-06-11 ENCOUNTER — Other Ambulatory Visit: Payer: Self-pay

## 2020-06-11 ENCOUNTER — Ambulatory Visit (INDEPENDENT_AMBULATORY_CARE_PROVIDER_SITE_OTHER): Payer: PPO | Admitting: Physical Medicine and Rehabilitation

## 2020-06-11 DIAGNOSIS — R202 Paresthesia of skin: Secondary | ICD-10-CM | POA: Diagnosis not present

## 2020-06-11 DIAGNOSIS — R531 Weakness: Secondary | ICD-10-CM

## 2020-06-11 DIAGNOSIS — M542 Cervicalgia: Secondary | ICD-10-CM

## 2020-06-11 DIAGNOSIS — M79642 Pain in left hand: Secondary | ICD-10-CM

## 2020-06-11 DIAGNOSIS — M79641 Pain in right hand: Secondary | ICD-10-CM | POA: Diagnosis not present

## 2020-06-11 NOTE — Progress Notes (Signed)
Pt state left hand and ar tingling.  Pt state just everyday things makes the pain worse. Pt state pain comes and goes. Pt state she has to rub her hands to ease sum pain.  Right Dominant hand Numbness, tingling.  Numeric Pain Rating Scale and Functional Assessment Average Pain 8   In the last MONTH (on 0-10 scale) has pain interfered with the following?  1. General activity like being  able to carry out your everyday physical activities such as walking, climbing stairs, carrying groceries, or moving a chair?  Rating(5)

## 2020-06-11 NOTE — Procedures (Signed)
EMG & NCV Findings: Evaluation of the left median motor and the right median motor nerves showed prolonged distal onset latency (L4.8, R9.1 ms), reduced amplitude (L0.5, R3.9 mV), and decreased conduction velocity (Elbow-Wrist, L11, R35 m/s).  The left median (across palm) sensory and the right median (across palm) sensory nerves showed no response (Wrist) and no response (Palm).  The right ulnar sensory nerve showed prolonged distal peak latency (4.0 ms) and decreased conduction velocity (Wrist-5th Digit, 35 m/s).  All remaining nerves (as indicated in the following tables) were within normal limits.  Left vs. Right side comparison data for the median motor nerve indicates abnormal L-R latency difference (4.3 ms), abnormal L-R amplitude difference (87.2 %), and abnormal L-R velocity difference (Elbow-Wrist, 24 m/s).    Needle evaluation of the left abductor pollicis brevis muscle showed decreased insertional activity, widespread spontaneous activity, and diminished recruitment.    Impression: The above electrodiagnostic study is ABNORMAL and reveals evidence of a severe bilateral left worse than right median nerve entrapment at the wrist (carpal tunnel syndrome) affecting sensory and motor components. The lesion is characterized by sensory and motor demyelination with evidence of axonal injury.  This represents worsening especially on the left since the prior electrodiagnostic study.   There is no significant electrodiagnostic evidence of any other focal nerve entrapment or generalized peripheral neuropathy.   Recommendations: 1.  Follow-up with referring physician. 2.  Continue current management of symptoms.  Suggest use of gabapentin with slow taper up to potential treatment dose. 3.  Continue use of resting splint at night-time and as needed during the day. 4.  Suggest surgical evaluation.  ___________________________ Laurence Spates FAAPMR Board Certified, American Board of Physical Medicine and  Rehabilitation    Nerve Conduction Studies Anti Sensory Summary Table   Stim Site NR Peak (ms) Norm Peak (ms) P-T Amp (V) Norm P-T Amp Site1 Site2 Delta-P (ms) Dist (cm) Vel (m/s) Norm Vel (m/s)  Left Median Acr Palm Anti Sensory (2nd Digit)  30.6C  Wrist *NR  <3.6  >10 Wrist Palm  0.0    Palm *NR  <2.0          Right Median Acr Palm Anti Sensory (2nd Digit)  31.4C  Wrist *NR  <3.6  >10 Wrist Palm  0.0    Palm *NR  <2.0          Right Radial Anti Sensory (Base 1st Digit)  30.7C  Wrist    2.4 <3.1 18.4  Wrist Base 1st Digit 2.4 0.0    Right Ulnar Anti Sensory (5th Digit)  31.5C  Wrist    *4.0 <3.7 15.0 >15.0 Wrist 5th Digit 4.0 14.0 *35 >38   Motor Summary Table   Stim Site NR Onset (ms) Norm Onset (ms) O-P Amp (mV) Norm O-P Amp Site1 Site2 Delta-0 (ms) Dist (cm) Vel (m/s) Norm Vel (m/s)  Left Median Motor (Abd Poll Brev)  30.4C  Wrist    *4.8 <4.2 *0.5 >5 Elbow Wrist 23.6 25.0 *11 >50  Elbow    28.4  0.2  Axilla Elbow 14.6 0.0    Axilla    13.8  0.0         Right Median Motor (Abd Poll Brev)  31.1C  Wrist    *9.1 <4.2 *3.9 >5 Elbow Wrist 6.7 23.5 *35 >50  Elbow    15.8  3.3         Right Ulnar Motor (Abd Dig Min)  31.1C  Wrist    3.1 <4.2 8.2 >3  B Elbow Wrist 4.0 22.5 56 >53  B Elbow    7.1  8.8  A Elbow B Elbow 1.5 10.0 67 >53  A Elbow    8.6  9.4          EMG   Side Muscle Nerve Root Ins Act Fibs Psw Amp Dur Poly Recrt Int Fraser Din Comment  Left Abd Poll Brev Median C8-T1 *Decr *4+ *4+ Nml Nml 0 *Reduced Nml     Nerve Conduction Studies Anti Sensory Left/Right Comparison   Stim Site L Lat (ms) R Lat (ms) L-R Lat (ms) L Amp (V) R Amp (V) L-R Amp (%) Site1 Site2 L Vel (m/s) R Vel (m/s) L-R Vel (m/s)  Median Acr Palm Anti Sensory (2nd Digit)  30.6C  Wrist       Wrist Palm     Palm             Radial Anti Sensory (Base 1st Digit)  30.7C  Wrist  2.4   18.4  Wrist Base 1st Digit     Ulnar Anti Sensory (5th Digit)  31.5C  Wrist  *4.0   15.0  Wrist 5th Digit  *35      Motor Left/Right Comparison   Stim Site L Lat (ms) R Lat (ms) L-R Lat (ms) L Amp (mV) R Amp (mV) L-R Amp (%) Site1 Site2 L Vel (m/s) R Vel (m/s) L-R Vel (m/s)  Median Motor (Abd Poll Brev)  30.4C  Wrist *4.8 *9.1 *4.3 *0.5 *3.9 *87.2 Elbow Wrist *11 *35 *24  Elbow 28.4 15.8 12.6 0.2 3.3 93.9 Axilla Elbow     Axilla 13.8   0.0         Ulnar Motor (Abd Dig Min)  31.1C  Wrist  3.1   8.2  B Elbow Wrist  56   B Elbow  7.1   8.8  A Elbow B Elbow  67   A Elbow  8.6   9.4           Waveforms:

## 2020-06-16 NOTE — Progress Notes (Signed)
Pam Knapp - 74 y.o. female MRN [id number]  Date of birth: Mar 21, 1946  Office Visit Note: Visit Date: 06/11/2020 PCP: Sandrea Hughs, NP Referred by: Sandrea Hughs, NP  Subjective: Chief Complaint  Patient presents with  . Left Hand - Numbness, Weakness, Pain  . Left Arm - Numbness  . Right Hand - Pain, Numbness  . Neck - Pain   HPI: Pam Knapp is a 74 y.o. female who comes in today For evaluation and management of chronic worsening severe left more than right hand pain with numbness tingling and weakness but also with left arm pain with numbness and tingling.  She also reports neck pain.  She suffers from Alzheimer's disease with memory loss.  Her daughter is present who provides some of the details.  By way of quick review patient had prior electrodiagnostic study on 10/12/2010 at the request of Dr. Estanislado Pandy with results noted below.  This did show severe bilateral carpal tunnel syndrome or median neuropathy at the wrist.  This did not show any radiculopathy or peripheral nerve neuropathy.  Today she reports 8 out of 10 pain which is really limiting what she can do particularly with the left hand pain and tingling and numbness and weakness more than right but also in the right hand as well.  She reports every day use makes it worse is worse at night as well.  Symptoms are fairly constant however.  She is right-hand dominant.  She reports left hand is much worse.  She rubs her hand together at times and uses compression gloves without much relief.  She has noted some neck pain but no radicular complaints down the arms.  Left arm does feel like it has numbness but this seems to go upwards.  She has had no specific injury.  No history of diabetes.  Not currently using gabapentin or other nerve membrane stabilizing medications.  She is taking turmeric.  Review of Systems  Musculoskeletal: Positive for joint pain and neck pain.  Neurological: Positive for tingling and focal  weakness.  All other systems reviewed and are negative.  Otherwise per HPI.  Assessment & Plan: Visit Diagnoses:  1. Paresthesia of skin   2. Weakness   3. Bilateral hand pain   4. Cervicalgia     Plan: Findings:  Chronic worsening severe left more than right hand pain with numbness and tingling and some pain with numbness and tingling in the left arm.  Patient is difficult historian daughter is present who provides some of the history.  She is suffering from early Alzheimer's disease.  Prior electrodiagnostic study in 2015 did show severe bilateral median nerve neuropathy.  At the time she elected not to have surgery performed.  Today after evaluation we decided to go ahead and repeat electrodiagnostic study to see if there was any worsening or improvement.  Results are as below.  Cervicalgia likely more from an osteoarthritic condition of the cervical spine.  This does not seem to be related to a radiculopathy radiculitis.   Impression: The above electrodiagnostic study is ABNORMAL and reveals evidence of a severe bilateral left worse than right median nerve entrapment at the wrist (carpal tunnel syndrome) affecting sensory and motor components. The lesion is characterized by sensory and motor demyelination with evidence of axonal injury.  This represents worsening especially on the left since the prior electrodiagnostic study.   There is no significant electrodiagnostic evidence of any other focal nerve entrapment or generalized peripheral neuropathy.  Recommendations: 1.  Follow-up with referring physician. 2.  Continue current management of symptoms.  Suggest use of gabapentin with slow taper up to potential treatment dose. 3.  Continue use of resting splint at night-time and as needed during the day. 4.  Suggest surgical evaluation.  Meds & Orders: No orders of the defined types were placed in this encounter.   Orders Placed This Encounter  Procedures  . NCV with EMG  (electromyography)    Follow-up: Return for Cy Blamer, MD as scheduled.   Procedures: No procedures performed  EMG & NCV Findings: Evaluation of the left median motor and the right median motor nerves showed prolonged distal onset latency (L4.8, R9.1 ms), reduced amplitude (L0.5, R3.9 mV), and decreased conduction velocity (Elbow-Wrist, L11, R35 m/s).  The left median (across palm) sensory and the right median (across palm) sensory nerves showed no response (Wrist) and no response (Palm).  The right ulnar sensory nerve showed prolonged distal peak latency (4.0 ms) and decreased conduction velocity (Wrist-5th Digit, 35 m/s).  All remaining nerves (as indicated in the following tables) were within normal limits.  Left vs. Right side comparison data for the median motor nerve indicates abnormal L-R latency difference (4.3 ms), abnormal L-R amplitude difference (87.2 %), and abnormal L-R velocity difference (Elbow-Wrist, 24 m/s).    Needle evaluation of the left abductor pollicis brevis muscle showed decreased insertional activity, widespread spontaneous activity, and diminished recruitment.    Impression: The above electrodiagnostic study is ABNORMAL and reveals evidence of a severe bilateral left worse than right median nerve entrapment at the wrist (carpal tunnel syndrome) affecting sensory and motor components. The lesion is characterized by sensory and motor demyelination with evidence of axonal injury.  This represents worsening especially on the left since the prior electrodiagnostic study.   There is no significant electrodiagnostic evidence of any other focal nerve entrapment or generalized peripheral neuropathy.   Recommendations: 1.  Follow-up with referring physician. 2.  Continue current management of symptoms.  Suggest use of gabapentin with slow taper up to potential treatment dose. 3.  Continue use of resting splint at night-time and as needed during the day. 4.  Suggest  surgical evaluation.  ___________________________ Laurence Spates FAAPMR Board Certified, American Board of Physical Medicine and Rehabilitation    Nerve Conduction Studies Anti Sensory Summary Table   Stim Site NR Peak (ms) Norm Peak (ms) P-T Amp (V) Norm P-T Amp Site1 Site2 Delta-P (ms) Dist (cm) Vel (m/s) Norm Vel (m/s)  Left Median Acr Palm Anti Sensory (2nd Digit)  30.6C  Wrist *NR  <3.6  >10 Wrist Palm  0.0    Palm *NR  <2.0          Right Median Acr Palm Anti Sensory (2nd Digit)  31.4C  Wrist *NR  <3.6  >10 Wrist Palm  0.0    Palm *NR  <2.0          Right Radial Anti Sensory (Base 1st Digit)  30.7C  Wrist    2.4 <3.1 18.4  Wrist Base 1st Digit 2.4 0.0    Right Ulnar Anti Sensory (5th Digit)  31.5C  Wrist    *4.0 <3.7 15.0 >15.0 Wrist 5th Digit 4.0 14.0 *35 >38   Motor Summary Table   Stim Site NR Onset (ms) Norm Onset (ms) O-P Amp (mV) Norm O-P Amp Site1 Site2 Delta-0 (ms) Dist (cm) Vel (m/s) Norm Vel (m/s)  Left Median Motor (Abd Poll Brev)  30.4C  Wrist    *4.8 <4.2 *  0.5 >5 Elbow Wrist 23.6 25.0 *11 >50  Elbow    28.4  0.2  Axilla Elbow 14.6 0.0    Axilla    13.8  0.0         Right Median Motor (Abd Poll Brev)  31.1C  Wrist    *9.1 <4.2 *3.9 >5 Elbow Wrist 6.7 23.5 *35 >50  Elbow    15.8  3.3         Right Ulnar Motor (Abd Dig Min)  31.1C  Wrist    3.1 <4.2 8.2 >3 B Elbow Wrist 4.0 22.5 56 >53  B Elbow    7.1  8.8  A Elbow B Elbow 1.5 10.0 67 >53  A Elbow    8.6  9.4          EMG   Side Muscle Nerve Root Ins Act Fibs Psw Amp Dur Poly Recrt Int Fraser Din Comment  Left Abd Poll Brev Median C8-T1 *Decr *4+ *4+ Nml Nml 0 *Reduced Nml     Nerve Conduction Studies Anti Sensory Left/Right Comparison   Stim Site L Lat (ms) R Lat (ms) L-R Lat (ms) L Amp (V) R Amp (V) L-R Amp (%) Site1 Site2 L Vel (m/s) R Vel (m/s) L-R Vel (m/s)  Median Acr Palm Anti Sensory (2nd Digit)  30.6C  Wrist       Wrist Palm     Palm             Radial Anti Sensory (Base 1st Digit)  30.7C    Wrist  2.4   18.4  Wrist Base 1st Digit     Ulnar Anti Sensory (5th Digit)  31.5C  Wrist  *4.0   15.0  Wrist 5th Digit  *35    Motor Left/Right Comparison   Stim Site L Lat (ms) R Lat (ms) L-R Lat (ms) L Amp (mV) R Amp (mV) L-R Amp (%) Site1 Site2 L Vel (m/s) R Vel (m/s) L-R Vel (m/s)  Median Motor (Abd Poll Brev)  30.4C  Wrist *4.8 *9.1 *4.3 *0.5 *3.9 *87.2 Elbow Wrist *11 *35 *24  Elbow 28.4 15.8 12.6 0.2 3.3 93.9 Axilla Elbow     Axilla 13.8   0.0         Ulnar Motor (Abd Dig Min)  31.1C  Wrist  3.1   8.2  B Elbow Wrist  56   B Elbow  7.1   8.8  A Elbow B Elbow  67   A Elbow  8.6   9.4           Waveforms:                 Clinical History: 10/12/2010 EMG/NCS Bilater upper extremity: Severe Bilateral median nerve neuropathy at the wrists.   She reports that she has quit smoking. She has a 10.00 pack-year smoking history. She has never used smokeless tobacco. No results for input(s): HGBA1C, LABURIC in the last 8760 hours.  Objective:  VS:  HT:    WT:   BMI:     BP:   HR: bpm  TEMP: ( )  RESP:  Physical Exam Vitals and nursing note reviewed.  Constitutional:      General: She is not in acute distress.    Appearance: Normal appearance. She is not ill-appearing.  HENT:     Head: Normocephalic and atraumatic.     Right Ear: External ear normal.     Left Ear: External ear normal.  Eyes:     Extraocular  Movements: Extraocular movements intact.  Cardiovascular:     Rate and Rhythm: Normal rate.     Pulses: Normal pulses.  Musculoskeletal:        General: No swelling or deformity.     Cervical back: Tenderness present. No rigidity.     Right lower leg: No edema.     Left lower leg: No edema.     Comments: Inspection reveals  atrophy of the bilateral APB Left more than right but no atrophy of the bilateral FDI or hand intrinsics. There is no swelling, color changes, allodynia or dystrophic changes. There is 5 out of 5 strength in the bilateral wrist extension,  finger abduction and long finger flexion.  There is decreased sensation in the median nerve distribution bilaterally.  There is a positive Tinel's over the left wrist.  There is a negative Hoffmann's test bilaterally.  Cervical spine reveals reduced range of motion with extension and rotation bilaterally.  Lymphadenopathy:     Cervical: No cervical adenopathy.  Skin:    General: Skin is warm and dry.     Findings: No erythema, lesion or rash.  Neurological:     General: No focal deficit present.     Mental Status: She is alert and oriented to person, place, and time.     Sensory: No sensory deficit.     Motor: No weakness or abnormal muscle tone.     Coordination: Coordination normal.  Psychiatric:        Mood and Affect: Mood normal.        Behavior: Behavior normal.     Ortho Exam  Imaging: No results found.  Past Medical/Family/Surgical/Social History: Medications & Allergies reviewed per EMR, new medications updated. Patient Active Problem List   Diagnosis Date Noted  . Alzheimers disease ([place]) 02/18/2019  . Polymyalgia rheumatica ([place]) 12/06/2018  . Body mass index 28.0-28.9, adult 12/06/2018  . Essential hypertension 12/06/2018  . Memory loss 11/18/2018  . Visual disturbance 11/18/2018  . Attention deficit 11/18/2018  . Depression 11/18/2018   Past Medical History:  Diagnosis Date  . ADHD   . Arthritis   . Cataracts, bilateral   . Dementia ([place])   . Depression   . Glaucoma   . Hypertension   . Polymyalgia rheumatica ([facility])    Family History  Problem Relation Age of Onset  . Diabetes Mother   . Kidney failure Mother   . High blood pressure Sister   . Neuropathy Sister   . Heart disease Brother   . Asthma Sister   . Sleep apnea Sister   . Bipolar disorder Sister   . Diabetes Sister   . Diabetes Sister   . Breast cancer Sister   . Fibromyalgia Daughter   . Colon cancer Other   . Stomach cancer Other   . Rectal cancer Other   . Esophageal cancer Other      Past Surgical History:  Procedure Laterality Date  . PARTIAL HIP ARTHROPLASTY Right 2011  . REPLACEMENT TOTAL KNEE Left 2013   Dr Jodell Cipro   Social History   Occupational History  . Occupation: Retired  Tobacco Use  . Smoking status: Former Smoker    Packs/day: 0.50    Years: 20.00    Pack years: 10.00  . Smokeless tobacco: Never Used  . Tobacco comment: quit over 20 years ago  Vaping Use  . Vaping Use: Never used  Substance and Sexual Activity  . Alcohol use: Yes    Comment: 2-3 drinks a  year  . Drug use: Never  . Sexual activity: Not on file

## 2020-06-17 ENCOUNTER — Encounter: Payer: Self-pay | Admitting: Physical Medicine and Rehabilitation

## 2020-06-17 NOTE — Progress Notes (Deleted)
Pam Knapp - 75 y.o. female MRN [id number]  Date of birth: March 10, 1946  Office Visit Note: Visit Date: 06/11/2020 PCP: Sandrea Hughs, NP Referred by: Sandrea Hughs, NP  Subjective: Chief Complaint  Patient presents with  . Left Hand - Numbness, Weakness, Pain  . Left Arm - Numbness  . Right Hand - Pain, Numbness  . Neck - Pain   HPI: Pam Knapp is a 74 y.o. female who comes in today HPI ROS Otherwise per HPI.  Assessment & Plan: Visit Diagnoses:  1. Paresthesia of skin   2. Weakness   3. Bilateral hand pain   4. Cervicalgia     Plan: No additional findings.   Meds & Orders: No orders of the defined types were placed in this encounter.   Orders Placed This Encounter  Procedures  . NCV with EMG (electromyography)    Follow-up: No follow-ups on file.   Procedures: No procedures performed  EMG & NCV Findings: Evaluation of the left median motor and the right median motor nerves showed prolonged distal onset latency (L4.8, R9.1 ms), reduced amplitude (L0.5, R3.9 mV), and decreased conduction velocity (Elbow-Wrist, L11, R35 m/s).  The left median (across palm) sensory and the right median (across palm) sensory nerves showed no response (Wrist) and no response (Palm).  The right ulnar sensory nerve showed prolonged distal peak latency (4.0 ms) and decreased conduction velocity (Wrist-5th Digit, 35 m/s).  All remaining nerves (as indicated in the following tables) were within normal limits.  Left vs. Right side comparison data for the median motor nerve indicates abnormal L-R latency difference (4.3 ms), abnormal L-R amplitude difference (87.2 %), and abnormal L-R velocity difference (Elbow-Wrist, 24 m/s).    Needle evaluation of the left abductor pollicis brevis muscle showed decreased insertional activity, widespread spontaneous activity, and diminished recruitment.    Impression: The above electrodiagnostic study is ABNORMAL and reveals evidence of a  severe bilateral left worse than right median nerve entrapment at the wrist (carpal tunnel syndrome) affecting sensory and motor components. The lesion is characterized by sensory and motor demyelination with evidence of axonal injury.  This represents worsening especially on the left since the prior electrodiagnostic study.   There is no significant electrodiagnostic evidence of any other focal nerve entrapment or generalized peripheral neuropathy.   Recommendations: 1.  Follow-up with referring physician. 2.  Continue current management of symptoms. 3.  Continue use of resting splint at night-time and as needed during the day. 4.  Suggest surgical evaluation.  ___________________________ Laurence Spates FAAPMR Board Certified, American Board of Physical Medicine and Rehabilitation    Nerve Conduction Studies Anti Sensory Summary Table   Stim Site NR Peak (ms) Norm Peak (ms) P-T Amp (V) Norm P-T Amp Site1 Site2 Delta-P (ms) Dist (cm) Vel (m/s) Norm Vel (m/s)  Left Median Acr Palm Anti Sensory (2nd Digit)  30.6C  Wrist *NR  <3.6  >10 Wrist Palm  0.0    Palm *NR  <2.0          Right Median Acr Palm Anti Sensory (2nd Digit)  31.4C  Wrist *NR  <3.6  >10 Wrist Palm  0.0    Palm *NR  <2.0          Right Radial Anti Sensory (Base 1st Digit)  30.7C  Wrist    2.4 <3.1 18.4  Wrist Base 1st Digit 2.4 0.0    Right Ulnar Anti Sensory (5th Digit)  31.5C  Wrist    *4.0 <3.7  15.0 >15.0 Wrist 5th Digit 4.0 14.0 *35 >38   Motor Summary Table   Stim Site NR Onset (ms) Norm Onset (ms) O-P Amp (mV) Norm O-P Amp Site1 Site2 Delta-0 (ms) Dist (cm) Vel (m/s) Norm Vel (m/s)  Left Median Motor (Abd Poll Brev)  30.4C  Wrist    *4.8 <4.2 *0.5 >5 Elbow Wrist 23.6 25.0 *11 >50  Elbow    28.4  0.2  Axilla Elbow 14.6 0.0    Axilla    13.8  0.0         Right Median Motor (Abd Poll Brev)  31.1C  Wrist    *9.1 <4.2 *3.9 >5 Elbow Wrist 6.7 23.5 *35 >50  Elbow    15.8  3.3         Right Ulnar Motor (Abd Dig  Min)  31.1C  Wrist    3.1 <4.2 8.2 >3 B Elbow Wrist 4.0 22.5 56 >53  B Elbow    7.1  8.8  A Elbow B Elbow 1.5 10.0 67 >53  A Elbow    8.6  9.4          EMG   Side Muscle Nerve Root Ins Act Fibs Psw Amp Dur Poly Recrt Int Fraser Din Comment  Left Abd Poll Brev Median C8-T1 *Decr *4+ *4+ Nml Nml 0 *Reduced Nml     Nerve Conduction Studies Anti Sensory Left/Right Comparison   Stim Site L Lat (ms) R Lat (ms) L-R Lat (ms) L Amp (V) R Amp (V) L-R Amp (%) Site1 Site2 L Vel (m/s) R Vel (m/s) L-R Vel (m/s)  Median Acr Palm Anti Sensory (2nd Digit)  30.6C  Wrist       Wrist Palm     Palm             Radial Anti Sensory (Base 1st Digit)  30.7C  Wrist  2.4   18.4  Wrist Base 1st Digit     Ulnar Anti Sensory (5th Digit)  31.5C  Wrist  *4.0   15.0  Wrist 5th Digit  *35    Motor Left/Right Comparison   Stim Site L Lat (ms) R Lat (ms) L-R Lat (ms) L Amp (mV) R Amp (mV) L-R Amp (%) Site1 Site2 L Vel (m/s) R Vel (m/s) L-R Vel (m/s)  Median Motor (Abd Poll Brev)  30.4C  Wrist *4.8 *9.1 *4.3 *0.5 *3.9 *87.2 Elbow Wrist *11 *35 *24  Elbow 28.4 15.8 12.6 0.2 3.3 93.9 Axilla Elbow     Axilla 13.8   0.0         Ulnar Motor (Abd Dig Min)  31.1C  Wrist  3.1   8.2  B Elbow Wrist  56   B Elbow  7.1   8.8  A Elbow B Elbow  67   A Elbow  8.6   9.4           Waveforms:                 Clinical History: No specialty comments available.   She reports that she has quit smoking. She has a 10.00 pack-year smoking history. She has never used smokeless tobacco. No results for input(s): HGBA1C, LABURIC in the last 8760 hours.  Objective:  VS:  HT:    WT:   BMI:     BP:   HR: bpm  TEMP: ( )  RESP:  Physical Exam  Ortho Exam  Imaging: No results found.  Past Medical/Family/Surgical/Social History: Medications & Allergies reviewed per  EMR, new medications updated. Patient Active Problem List   Diagnosis Date Noted  . Alzheimers disease ([place]) 02/18/2019  . Polymyalgia rheumatica ([place])  12/06/2018  . Body mass index 28.0-28.9, adult 12/06/2018  . Essential hypertension 12/06/2018  . Memory loss 11/18/2018  . Visual disturbance 11/18/2018  . Attention deficit 11/18/2018  . Depression 11/18/2018   Past Medical History:  Diagnosis Date  . ADHD   . Arthritis   . Cataracts, bilateral   . Dementia ([place])   . Depression   . Glaucoma   . Hypertension   . Polymyalgia rheumatica ([facility])    Family History  Problem Relation Age of Onset  . Diabetes Mother   . Kidney failure Mother   . High blood pressure Sister   . Neuropathy Sister   . Heart disease Brother   . Asthma Sister   . Sleep apnea Sister   . Bipolar disorder Sister   . Diabetes Sister   . Diabetes Sister   . Breast cancer Sister   . Fibromyalgia Daughter   . Colon cancer Other   . Stomach cancer Other   . Rectal cancer Other   . Esophageal cancer Other    Past Surgical History:  Procedure Laterality Date  . PARTIAL HIP ARTHROPLASTY Right 2011  . REPLACEMENT TOTAL KNEE Left 2013   Dr Jodell Cipro   Social History   Occupational History  . Occupation: Retired  Tobacco Use  . Smoking status: Former Smoker    Packs/day: 0.50    Years: 20.00    Pack years: 10.00  . Smokeless tobacco: Never Used  . Tobacco comment: quit over 20 years ago  Vaping Use  . Vaping Use: Never used  Substance and Sexual Activity  . Alcohol use: Yes    Comment: 2-3 drinks a year  . Drug use: Never  . Sexual activity: Not on file

## 2020-06-20 NOTE — Progress Notes (Signed)
Office Visit Note  Patient: Pam Knapp             Date of Birth: [birth year]           MRN: [id number]             PCP: Sandrea Hughs, NP Referring: Sandrea Hughs, NP Visit Date: 06/29/2020 Occupation: @GUAROCC @  Subjective:  Pain in neck and both hands.   History of Present Illness: Pam Knapp is a 74 y.o. female with history of osteoarthritis, degenerative disc disease and Sjogren's.  She states she continues to have pain and discomfort in her hands with numbness.  She also has neck pain and some radiculopathy to the left side.  She was seen by Dr. Ernestina Patches and had nerve conduction velocities which showed bilateral carpal tunnel syndrome.  She has left hand severe carpal tunnel syndrome.  She denies any muscular weakness or tenderness.  She has mild dry eye symptoms which are manageable.  Activities of Daily Living:  Patient reports morning stiffness for several  minutes.   Patient Reports nocturnal pain.  Difficulty dressing/grooming: Denies Difficulty climbing stairs: Denies Difficulty getting out of chair: Denies Difficulty using hands for taps, buttons, cutlery, and/or writing: Reports  Review of Systems  Constitutional: Negative for fatigue.  HENT: Negative for mouth sores, mouth dryness and nose dryness.   Eyes: Negative for itching and dryness.  Respiratory: Negative for shortness of breath and difficulty breathing.   Cardiovascular: Negative for chest pain and palpitations.  Gastrointestinal: Negative for blood in stool, constipation and diarrhea.  Endocrine: Negative for increased urination.  Genitourinary: Negative for difficulty urinating and painful urination.  Musculoskeletal: Positive for arthralgias, joint pain and morning stiffness. Negative for joint swelling, myalgias, muscle tenderness and myalgias.  Skin: Negative for color change, rash and redness.  Allergic/Immunologic: Negative for susceptible to infections.  Neurological: Positive  for numbness. Negative for dizziness, headaches and weakness.  Hematological: Negative for bruising/bleeding tendency.  Psychiatric/Behavioral: Negative for depressed mood and sleep disturbance.    PMFS History:  Patient Active Problem List   Diagnosis Date Noted  . Alzheimers disease ([place]) 02/18/2019  . Polymyalgia rheumatica ([place]) 12/06/2018  . Body mass index 28.0-28.9, adult 12/06/2018  . Essential hypertension 12/06/2018  . Memory loss 11/18/2018  . Visual disturbance 11/18/2018  . Attention deficit 11/18/2018  . Depression 11/18/2018    Past Medical History:  Diagnosis Date  . ADHD   . Arthritis   . Cataracts, bilateral   . Dementia ([place])   . Depression   . Glaucoma   . Hypertension   . Polymyalgia rheumatica ([facility])     Family History  Problem Relation Age of Onset  . Diabetes Mother   . Kidney failure Mother   . High blood pressure Sister   . Neuropathy Sister   . Heart disease Brother   . Asthma Sister   . Sleep apnea Sister   . Bipolar disorder Sister   . Diabetes Sister   . Diabetes Sister   . Breast cancer Sister   . Fibromyalgia Daughter   . Colon cancer Other   . Stomach cancer Other   . Rectal cancer Other   . Esophageal cancer Other    Past Surgical History:  Procedure Laterality Date  . PARTIAL HIP ARTHROPLASTY Right 2011  . REPLACEMENT TOTAL KNEE Left 2013   Dr Jodell Cipro   Social History   Social History Narrative   Lives alone   Caffeine use: 2 coffees  per day   Right handed       Social History      Diet? Regular diet       Do you drink/eat things with caffeine? Yes coffee      Marital status?                widow                    What year were you married? [place] you live in a house, apartment, assisted living, condo, trailer, etc.? Apartment       Is it one or more stories? one      How many persons live in your home? one      Do you have any pets in your home? (please list) cockatiel       Highest level of  education completed? 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    yes                                  Type & how often? Daily chair aerobics - water aerobics       Advanced Directives      Do you have a living will? No       Do you have a DNR form?            no                      If not, do you want to discuss one? yes      Do you have signed POA/HPOA for forms? No       Functional Status      Do you have difficulty bathing or dressing yourself? No       Do you have difficulty preparing food or eating? No       Do you have difficulty managing your medications? yes      Do you have difficulty managing your finances? Yes daughter manages       Do you have difficulty affording your medications? no      Immunization History  Administered Date(s) Administered  . Influenza, High Dose Seasonal PF 07/29/2017, 08/30/2018, 08/14/2019  . PFIZER SARS-COV-2 Vaccination 11/20/2019, 12/15/2019  . Pneumococcal Conjugate-13 09/09/2019  . Zoster Recombinat (Shingrix) 10/22/2018     Objective: Vital Signs: BP (!) 144/75 (BP Location: Left Arm, Patient Position: Sitting, Cuff Size: Normal)   Pulse 83   Resp 15   Ht 5\' 7"  (1.702 m)   Wt 177 lb (80.3 kg)   BMI 27.72 kg/m    Physical Exam Vitals and nursing note reviewed.  Constitutional:      Appearance: She is well-developed.  HENT:     Head: Normocephalic and atraumatic.  Eyes:     Conjunctiva/sclera: Conjunctivae normal.  Cardiovascular:     Rate and Rhythm: Normal rate and regular rhythm.     Heart sounds: Normal heart sounds.  Pulmonary:     Effort: Pulmonary effort is normal.     Breath sounds: Normal breath sounds.  Abdominal:     General: Bowel sounds are normal.     Palpations: Abdomen is soft.  Musculoskeletal:     Cervical back: Normal range of motion.  Lymphadenopathy:     Cervical:  No cervical adenopathy.  Skin:    General: Skin is warm and dry.     Capillary Refill:  Capillary refill takes less than 2 seconds.  Neurological:     Mental Status: She is alert and oriented to person, place, and time.  Psychiatric:        Behavior: Behavior normal.      Musculoskeletal Exam: Patient has limited lateral rotation and some discomfort with range of motion.  Shoulder joints were in good range of motion.  She has limited extension of the right elbow joint.  She had good range of motion of bilateral wrist joints.  She has PIP and DIP thickening with no synovitis in her hands.  Hip joints and knee joints with good range of motion.  She had no tenderness over ankles or MTPs.  CDAI Exam: CDAI Score: -- Patient Global: --; Provider Global: -- Swollen: --; Tender: -- Joint Exam 06/29/2020   No joint exam has been documented for this visit   There is currently no information documented on the homunculus. Go to the Rheumatology activity and complete the homunculus joint exam.  Investigation: No additional findings.  Imaging: NCV with EMG (electromyography)  Result Date: 06/11/2020 Magnus Sinning, MD     06/17/2020  8:13 AM EMG & NCV Findings: Evaluation of the left median motor and the right median motor nerves showed prolonged distal onset latency (L4.8, R9.1 ms), reduced amplitude (L0.5, R3.9 mV), and decreased conduction velocity (Elbow-Wrist, L11, R35 m/s).  The left median (across palm) sensory and the right median (across palm) sensory nerves showed no response (Wrist) and no response (Palm).  The right ulnar sensory nerve showed prolonged distal peak latency (4.0 ms) and decreased conduction velocity (Wrist-5th Digit, 35 m/s).  All remaining nerves (as indicated in the following tables) were within normal limits.  Left vs. Right side comparison data for the median motor nerve indicates abnormal L-R latency difference (4.3 ms), abnormal L-R amplitude difference (87.2 %), and abnormal L-R velocity difference (Elbow-Wrist, 24 m/s).  Needle evaluation of the left  abductor pollicis brevis muscle showed decreased insertional activity, widespread spontaneous activity, and diminished recruitment.  Impression: The above electrodiagnostic study is ABNORMAL and reveals evidence of a severe bilateral left worse than right median nerve entrapment at the wrist (carpal tunnel syndrome) affecting sensory and motor components. The lesion is characterized by sensory and motor demyelination with evidence of axonal injury.  This represents worsening especially on the left since the prior electrodiagnostic study. There is no significant electrodiagnostic evidence of any other focal nerve entrapment or generalized peripheral neuropathy. Recommendations: 1.  Follow-up with referring physician. 2.  Continue current management of symptoms.  Suggest use of gabapentin with slow taper up to potential treatment dose. 3.  Continue use of resting splint at night-time and as needed during the day. 4.  Suggest surgical evaluation. ___________________________ Laurence Spates FAAPMR Board Certified, American Board of Physical Medicine and Rehabilitation Nerve Conduction Studies Anti Sensory Summary Table  Stim Site NR Peak (ms) Norm Peak (ms) P-T Amp (V) Norm P-T Amp Site1 Site2 Delta-P (ms) Dist (cm) Vel (m/s) Norm Vel (m/s) Left Median Acr Palm Anti Sensory (2nd Digit)  30.6C Wrist *NR  <3.6  >10 Wrist Palm  0.0   Palm *NR  <2.0         Right Median Acr Palm Anti Sensory (2nd Digit)  31.4C Wrist *NR  <3.6  >10 Wrist Palm  0.0   Palm *NR  <2.0  Right Radial Anti Sensory (Base 1st Digit)  30.7C Wrist    2.4 <3.1 18.4  Wrist Base 1st Digit 2.4 0.0   Right Ulnar Anti Sensory (5th Digit)  31.5C Wrist    *4.0 <3.7 15.0 >15.0 Wrist 5th Digit 4.0 14.0 *35 >38 Motor Summary Table  Stim Site NR Onset (ms) Norm Onset (ms) O-P Amp (mV) Norm O-P Amp Site1 Site2 Delta-0 (ms) Dist (cm) Vel (m/s) Norm Vel (m/s) Left Median Motor (Abd Poll Brev)  30.4C Wrist    *4.8 <4.2 *0.5 >5 Elbow Wrist 23.6 25.0 *11 >50  Elbow    28.4  0.2  Axilla Elbow 14.6 0.0   Axilla    13.8  0.0        Right Median Motor (Abd Poll Brev)  31.1C Wrist    *9.1 <4.2 *3.9 >5 Elbow Wrist 6.7 23.5 *35 >50 Elbow    15.8  3.3        Right Ulnar Motor (Abd Dig Min)  31.1C Wrist    3.1 <4.2 8.2 >3 B Elbow Wrist 4.0 22.5 56 >53 B Elbow    7.1  8.8  A Elbow B Elbow 1.5 10.0 67 >53 A Elbow    8.6  9.4        EMG  Side Muscle Nerve Root Ins Act Fibs Psw Amp Dur Poly Recrt Int Fraser Din Comment Left Abd Poll Brev Median C8-T1 *Decr *4+ *4+ Nml Nml 0 *Reduced Nml  Nerve Conduction Studies Anti Sensory Left/Right Comparison  Stim Site L Lat (ms) R Lat (ms) L-R Lat (ms) L Amp (V) R Amp (V) L-R Amp (%) Site1 Site2 L Vel (m/s) R Vel (m/s) L-R Vel (m/s) Median Acr Palm Anti Sensory (2nd Digit)  30.6C Wrist       Wrist Palm    Palm            Radial Anti Sensory (Base 1st Digit)  30.7C Wrist  2.4   18.4  Wrist Base 1st Digit    Ulnar Anti Sensory (5th Digit)  31.5C Wrist  *4.0   15.0  Wrist 5th Digit  *35  Motor Left/Right Comparison  Stim Site L Lat (ms) R Lat (ms) L-R Lat (ms) L Amp (mV) R Amp (mV) L-R Amp (%) Site1 Site2 L Vel (m/s) R Vel (m/s) L-R Vel (m/s) Median Motor (Abd Poll Brev)  30.4C Wrist *4.8 *9.1 *4.3 *0.5 *3.9 *87.2 Elbow Wrist *11 *35 *24 Elbow 28.4 15.8 12.6 0.2 3.3 93.9 Axilla Elbow    Axilla 13.8   0.0        Ulnar Motor (Abd Dig Min)  31.1C Wrist  3.1   8.2  B Elbow Wrist  56  B Elbow  7.1   8.8  A Elbow B Elbow  67  A Elbow  8.6   9.4       Waveforms:          XR Cervical Spine 2 or 3 views  Result Date: 06/04/2020 The level of spondylosis and facet joint arthropathy was noted.  Severe narrowing between C3-C4 C4-C5 C5-C6 and C6-C7 was noted.  Anterior osteophytes were noted. Impression: These findings are consistent with multilevel spondylosis and facet joint arthropathy.   Recent Labs: Lab Results  Component Value Date   WBC 4.4 11/18/2019   HGB 13.3 11/18/2019   PLT 164 11/18/2019   NA 139 11/18/2019   K 4.2 11/18/2019   CL  105 11/18/2019   CO2 25 11/18/2019   GLUCOSE 98  11/18/2019   BUN 9 11/18/2019   CREATININE 0.87 11/18/2019   BILITOT 0.5 11/18/2019   ALKPHOS 77 11/17/2009   AST 15 11/18/2019   ALT 8 11/18/2019   PROT 7.5 11/18/2019   ALBUMIN 3.9 11/17/2009   CALCIUM 9.9 11/18/2019   GFRAA 76 11/18/2019   June 04, 2020 ANA 1: 80 homogeneous, anti-Ro 1.7 (anti-SSB, Smith, RNP, dsDNA negative) C3-C4 normal  Speciality Comments: No specialty comments available.  Procedures:  No procedures performed Allergies: Patient has no known allergies.   Assessment / Plan:     Visit Diagnoses: DDD (degenerative disc disease), cervical - Multilevel spondylosis, anterior osteophytes and facet joint arthropathy.  History of bilateral hand numbness.  She was referred to physical therapy.  She was accompanied by her grandson today.  He said it would be better for her to have home PT as transportation is an issue.  I advised her to contact me in case her neck pain or radiculopathy gets worse.  Bilateral carpal tunnel syndrome - Bilateral severe carpal tunnel syndrome.  Nerve conduction velocity performed by Dr. Ernestina Patches on June 11, 2020.  Plan referral to orthopedics.  History of total hip replacement, right-she has some stiffness but good range of motion.  History of total knee replacement, left-she is good range of motion.  Sjogren's syndrome with keratoconjunctivitis sicca (HCC) - Positive ANA, positive Ro antibody, dry eyes not very symptomatic.  Use of over-the-counter eyedrops were discussed.  Polymyalgia rheumatica ([place]) - Diagnosed in 2002 and treated with prednisone.  She has been in remission.  She had no muscular weakness or tenderness on examination today.  Essential hypertension-her blood pressure is mildly elevated.  Have advised her to monitor that.  Late onset Alzheimer's disease without behavioral disturbance ([place])  Visual disturbance  Attention deficit  Educated about COVID-19 virus  infection-she is fully vaccinated against COVID-19.  Use of social distancing, hand hygiene and mask was discussed.  She was also advised to get a booster when it is available to her.  Orders: Orders Placed This Encounter  Procedures  . Ambulatory referral to Orthopedic Surgery  . Ambulatory referral to Home Health   No orders of the defined types were placed in this encounter.    Follow-Up Instructions: Return in about 6 months (around 12/27/2020) for OA, DDD, Sjogren's.   Bo Merino, MD  Note - This record has been created using Editor, commissioning.  Chart creation errors have been sought, but may not always  have been located. Such creation errors do not reflect on  the standard of medical care.

## 2020-06-29 ENCOUNTER — Other Ambulatory Visit: Payer: Self-pay

## 2020-06-29 ENCOUNTER — Ambulatory Visit: Payer: PPO | Admitting: Rheumatology

## 2020-06-29 ENCOUNTER — Encounter: Payer: Self-pay | Admitting: Rheumatology

## 2020-06-29 VITALS — BP 144/75 | HR 83 | Resp 15 | Ht 67.0 in | Wt 177.0 lb

## 2020-06-29 DIAGNOSIS — M5030 Other cervical disc degeneration, unspecified cervical region: Secondary | ICD-10-CM

## 2020-06-29 DIAGNOSIS — G301 Alzheimer's disease with late onset: Secondary | ICD-10-CM | POA: Diagnosis not present

## 2020-06-29 DIAGNOSIS — M3501 Sicca syndrome with keratoconjunctivitis: Secondary | ICD-10-CM | POA: Diagnosis not present

## 2020-06-29 DIAGNOSIS — I10 Essential (primary) hypertension: Secondary | ICD-10-CM

## 2020-06-29 DIAGNOSIS — R41840 Attention and concentration deficit: Secondary | ICD-10-CM

## 2020-06-29 DIAGNOSIS — Z96652 Presence of left artificial knee joint: Secondary | ICD-10-CM | POA: Diagnosis not present

## 2020-06-29 DIAGNOSIS — Z96641 Presence of right artificial hip joint: Secondary | ICD-10-CM | POA: Diagnosis not present

## 2020-06-29 DIAGNOSIS — H539 Unspecified visual disturbance: Secondary | ICD-10-CM | POA: Diagnosis not present

## 2020-06-29 DIAGNOSIS — M353 Polymyalgia rheumatica: Secondary | ICD-10-CM

## 2020-06-29 DIAGNOSIS — F0280 Dementia in other diseases classified elsewhere without behavioral disturbance: Secondary | ICD-10-CM

## 2020-06-29 DIAGNOSIS — Z7189 Other specified counseling: Secondary | ICD-10-CM

## 2020-06-29 DIAGNOSIS — G5603 Carpal tunnel syndrome, bilateral upper limbs: Secondary | ICD-10-CM | POA: Diagnosis not present

## 2020-06-29 NOTE — Patient Instructions (Signed)

## 2020-07-08 ENCOUNTER — Other Ambulatory Visit: Payer: Self-pay

## 2020-07-08 ENCOUNTER — Encounter: Payer: Self-pay | Admitting: Orthopaedic Surgery

## 2020-07-08 ENCOUNTER — Ambulatory Visit (INDEPENDENT_AMBULATORY_CARE_PROVIDER_SITE_OTHER): Payer: PPO | Admitting: Orthopaedic Surgery

## 2020-07-08 DIAGNOSIS — G5602 Carpal tunnel syndrome, left upper limb: Secondary | ICD-10-CM | POA: Diagnosis not present

## 2020-07-08 DIAGNOSIS — G5601 Carpal tunnel syndrome, right upper limb: Secondary | ICD-10-CM

## 2020-07-08 NOTE — Progress Notes (Signed)
Office Visit Note   Patient: Pam Knapp           Date of Birth: [birth year]           MRN: [id number] Visit Date: 07/08/2020              Requested by: Bo Merino, MD [address] [place],  [place] [address] PCP: Sandrea Hughs, NP   Assessment & Plan: Visit Diagnoses:  1. Carpal tunnel syndrome, left upper limb   2. Carpal tunnel syndrome, right upper limb     Plan: #1:  At this time we are going to schedule her for a left carpal tunnel release. #2: When she is completed that and has rehabilitated then we will schedule her for the right. #3: We will schedule this out of the office.   Follow-Up Instructions: She will be contacted by our surgical coordinator and then scheduled for a left carpal tunnel release and she will wait for that to occur.  Orders:  No orders of the defined types were placed in this encounter.  No orders of the defined types were placed in this encounter.     Procedures: No procedures performed   Clinical Data: No additional findings.   Subjective: Chief Complaint  Patient presents with  . Right Hand - Numbness  . Left Hand - Numbness    HPI  Pam Knapp is a very pleasant 74 year old African-American female who presents today at the referral from Dr. Estanislado Pandy for numbness and tingling in both hands left worse than right.  She apparently has been having this problem for some time.  She was seen by Dr. Estanislado Pandy at her rheumatologic appointment and EMGs were scheduled.  At that time was noted by Dr. Neoma Laming sure that they were abnormal with severe bilateral left worse than right median nerve entrapment at the wrist which was performed by Dr. Ernestina Patches.  She is seen today for evaluation.   Review of Systems Constitutional: Negative for fatigue.  HENT: Negative for mouth sores, mouth dryness and nose dryness.   Eyes: Positive for dryness. Negative for itching.  Respiratory: Negative for shortness of breath and  difficulty breathing.   Cardiovascular: Negative for chest pain and palpitations.  Gastrointestinal: Positive for diarrhea. Negative for blood in stool and constipation.  Endocrine: Negative for increased urination.  Genitourinary: Negative for difficulty urinating.  Musculoskeletal: Positive for arthralgias, joint pain, myalgias, morning stiffness and myalgias. Negative for joint swelling and muscle tenderness.  Skin: Negative for color change, rash and redness.  Allergic/Immunologic: Negative for susceptible to infections.  Neurological: Positive for numbness, parasthesias and memory loss. Negative for dizziness, headaches and weakness.  Hematological: Negative for bruising/bleeding tendency.  Psychiatric/Behavioral: Negative for depressed mood. The patient is not nervous/anxious.    Objective:  Physical Exam Constitutional:      Appearance: Normal appearance. She is well-developed and normal weight.  HENT:     Head: Normocephalic.     Mouth/Throat:     Mouth: Mucous membranes are moist.  Eyes:     Pupils: Pupils are equal, round, and reactive to light.  Cardiovascular:     Rate and Rhythm: Normal rate.  Pulmonary:     Effort: Pulmonary effort is normal.  Skin:    General: Skin is warm and dry.  Neurological:     Mental Status: She is alert and oriented to person, place, and time.  Psychiatric:        Mood and Affect: Mood normal.  Behavior: Behavior normal.     Ortho Exam  Exam today reveals very pleasant 74 year old African-American female well-developed well-nourished alert cooperative.  She does have positive Tinel's over both median nerves.  She does have some weakness with grip little bit more on her left side than on her right.  Good capillary refill.  Good radial pulses bilaterally.  Specialty Comments:  No specialty comments available.  Imaging: No results found.  EMG & NCV Findings: Evaluation of the left median motor and the right median motor nerves  showed prolonged distal onset latency (L4.8, R9.1 ms), reduced amplitude (L0.5, R3.9 mV), and decreased conduction velocity (Elbow-Wrist, L11, R35 m/s).  The left median (across palm) sensory and the right median (across palm) sensory nerves showed no response (Wrist) and no response (Palm).  The right ulnar sensory nerve showed prolonged distal peak latency (4.0 ms) and decreased conduction velocity (Wrist-5th Digit, 35 m/s).  All remaining nerves (as indicated in the following tables) were within normal limits.  Left vs. Right side comparison data for the median motor nerve indicates abnormal L-R latency difference (4.3 ms), abnormal L-R amplitude difference (87.2 %), and abnormal L-R velocity difference (Elbow-Wrist, 24 m/s).    Needle evaluation of the left abductor pollicis brevis muscle showed decreased insertional activity, widespread spontaneous activity, and diminished recruitment.    Impression: The above electrodiagnostic study is ABNORMAL and reveals evidence of a severe bilateral left worse than right median nerve entrapment at the wrist (carpal tunnel syndrome) affecting sensory and motor components. The lesion is characterized by sensory and motor demyelination with evidence of axonal injury.  This represents worsening especially on the left since the prior electrodiagnostic study.   There is no significant electrodiagnostic evidence of any other focal nerve entrapment or generalized peripheral neuropathy.    PMFS History: Current Outpatient Medications  Medication Sig Dispense Refill  . amLODipine (NORVASC) 10 MG tablet TAKE 1 TABLET BY MOUTH EVERY DAY 90 tablet 1  . aspirin 325 MG tablet Take 325 mg by mouth as needed.     Marland Kitchen buPROPion (WELLBUTRIN XL) 150 MG 24 hr tablet Take 1 tablet (150 mg total) by mouth daily. 90 tablet 3  . cetirizine (ZYRTEC) 10 MG tablet Take 10 mg by mouth as needed for allergies.    . Cholecalciferol (VITAMIN D) 125 MCG (5000 UT) CAPS Take 1 capsule  by mouth daily.    . Cranberry-Cholecalciferol 4200-500 MG-UNIT CAPS Take 1 capsule by mouth daily.    Marland Kitchen donepezil (ARICEPT) 10 MG tablet TAKE 1 TABLET BY MOUTH EVERYDAY AT BEDTIME 90 tablet 3  . doxazosin (CARDURA) 4 MG tablet TAKE 1 TABLET BY MOUTH EVERY DAY 90 tablet 0  . Latanoprostene Bunod (VYZULTA) 0.024 % SOLN Apply to eye daily.    . Loperamide HCl (IMODIUM PO) Take by mouth at bedtime.    . memantine (NAMENDA) 5 MG tablet Take 1 tablet (5 mg total) by mouth 2 (two) times daily. 60 tablet 11  . Omega-3 Fatty Acids (OMEGA 3 PO) Take 750 mg by mouth daily.    . RESTASIS 0.05 % ophthalmic emulsion 1 drop 2 (two) times daily.    Marland Kitchen spironolactone (ALDACTONE) 25 MG tablet TAKE 1 TABLET BY MOUTH TWICE A DAY 180 tablet 1  . Turmeric (QC TUMERIC COMPLEX PO) Take 15 mLs by mouth daily.    . vitamin C (ASCORBIC ACID) 500 MG tablet Take 500 mg by mouth daily.     No current facility-administered medications for this visit.  Patient Active Problem List   Diagnosis Date Noted  . Carpal tunnel syndrome, left upper limb 07/08/2020  . Carpal tunnel syndrome, right upper limb 07/08/2020  . Alzheimers disease ([place]) 02/18/2019  . Polymyalgia rheumatica ([place]) 12/06/2018  . Body mass index 28.0-28.9, adult 12/06/2018  . Essential hypertension 12/06/2018  . Memory loss 11/18/2018  . Visual disturbance 11/18/2018  . Attention deficit 11/18/2018  . Depression 11/18/2018   Past Medical History:  Diagnosis Date  . ADHD   . Arthritis   . Cataracts, bilateral   . Dementia ([place])   . Depression   . Glaucoma   . Hypertension   . Polymyalgia rheumatica ([facility])     Family History  Problem Relation Age of Onset  . Diabetes Mother   . Kidney failure Mother   . High blood pressure Sister   . Neuropathy Sister   . Heart disease Brother   . Asthma Sister   . Sleep apnea Sister   . Bipolar disorder Sister   . Diabetes Sister   . Diabetes Sister   . Breast cancer Sister   . Fibromyalgia Daughter    . Colon cancer Other   . Stomach cancer Other   . Rectal cancer Other   . Esophageal cancer Other     Past Surgical History:  Procedure Laterality Date  . PARTIAL HIP ARTHROPLASTY Right 2011  . REPLACEMENT TOTAL KNEE Left 2013   Dr Jodell Cipro   Social History   Occupational History  . Occupation: Retired  Tobacco Use  . Smoking status: Former Smoker    Packs/day: 0.50    Years: 20.00    Pack years: 10.00  . Smokeless tobacco: Never Used  . Tobacco comment: quit over 20 years ago  Vaping Use  . Vaping Use: Never used  Substance and Sexual Activity  . Alcohol use: Not Currently  . Drug use: Never  . Sexual activity: Not on file

## 2020-07-12 ENCOUNTER — Telehealth: Payer: Self-pay | Admitting: Orthopaedic Surgery

## 2020-07-12 NOTE — Telephone Encounter (Signed)
Left message on patient's voicemail providing name and number for her to call back to scheduled a surgical case with Dr Durward Fortes at [facility].

## 2020-07-19 ENCOUNTER — Other Ambulatory Visit: Payer: Self-pay | Admitting: Family

## 2020-07-19 ENCOUNTER — Other Ambulatory Visit: Payer: Self-pay | Admitting: Neurology

## 2020-07-20 ENCOUNTER — Telehealth: Payer: Self-pay

## 2020-07-20 NOTE — Telephone Encounter (Signed)
Patient's daughter Pam Knapp called regarding her mom's home health PT being denied.  Pam Knapp states her mom has no transportation and is requesting a return call to let her know if there are any other options available.

## 2020-07-20 NOTE — Telephone Encounter (Signed)
Warning comes up and says not recommend for geriatric patients

## 2020-07-21 NOTE — Telephone Encounter (Signed)
I called patient's daughter, patient's daughter was very upset because her mother was denied home PT. Patient's daughter had called patient's insurance who stated no claim was submitted for home PT to be denied. Patient's daughter stated that her mother does not have a driver's license, has issues with her memory and eyes. Patient's daughter stated that her mother cannot use public transportation due to her memory and eye issues. Ronald Pippins from [facility] had stated that insurance would not approve home PT because of insurance and Dr. Arlean Hopping note stating that patient's grandson provides transportation. I reached out to Judeen Hammans, Therapist, sports, at [facility], for assistance.  Patient also sees Dr. Durward Fortes.

## 2020-07-23 ENCOUNTER — Other Ambulatory Visit: Payer: Self-pay | Admitting: Neurology

## 2020-07-31 ENCOUNTER — Telehealth: Payer: Self-pay | Admitting: Orthopaedic Surgery

## 2020-07-31 NOTE — Telephone Encounter (Signed)
Spoke with patient's daughter Pam Knapp  about scheduling her mother's carpal tunnel release.  We are holding a date of November 18th at [place].  Pam Knapp would like to speak to the doctor or the assistant about down time and follow up visits. I did explain the post op takes place normally a week to 10 days after surgery.  She would like to know how many visits after the first post op.  Also would like to know limitations and healing time.   Pam Knapp's cb  [phone]

## 2020-08-02 NOTE — Telephone Encounter (Signed)
FYI

## 2020-08-02 NOTE — Telephone Encounter (Signed)
called

## 2020-08-02 NOTE — Telephone Encounter (Signed)
Please advise 

## 2020-08-10 ENCOUNTER — Other Ambulatory Visit: Payer: Self-pay | Admitting: Neurology

## 2020-08-24 ENCOUNTER — Telehealth: Payer: Self-pay | Admitting: Orthopaedic Surgery

## 2020-08-24 NOTE — Telephone Encounter (Signed)
Patient's daughter was in a MVA.  She will need to reschedule her mother's  Carpal tunnel release scheduled for this Thursday at [place] to a later date. Patient relies on her daughter for transportation.

## 2020-08-25 NOTE — Telephone Encounter (Signed)
Thanks-please call Pam Knapp and relate the same so she can reschedule

## 2020-09-06 ENCOUNTER — Ambulatory Visit: Payer: PPO | Admitting: Family Medicine

## 2020-09-06 ENCOUNTER — Encounter: Payer: Self-pay | Admitting: Family Medicine

## 2020-09-06 ENCOUNTER — Other Ambulatory Visit: Payer: Self-pay

## 2020-09-06 VITALS — BP 140/72 | HR 71 | Ht 68.0 in | Wt 174.0 lb

## 2020-09-06 DIAGNOSIS — G301 Alzheimer's disease with late onset: Secondary | ICD-10-CM | POA: Diagnosis not present

## 2020-09-06 DIAGNOSIS — F0280 Dementia in other diseases classified elsewhere without behavioral disturbance: Secondary | ICD-10-CM

## 2020-09-06 MED ORDER — MEMANTINE HCL 10 MG PO TABS: 10.0000 mg | ORAL_TABLET | Freq: Two times a day (BID) | ORAL | 3 refills | Status: DC

## 2020-09-06 MED ORDER — DONEPEZIL HCL 10 MG PO TABS: ORAL_TABLET | ORAL | 3 refills | Status: DC

## 2020-09-06 NOTE — Patient Instructions (Signed)
Below is our plan:  We will continue Aricept 10mg  daily and increase Namenda to 10mg  twice daily.   Please make sure you are staying well hydrated. I recommend 50-60 ounces daily. Well balanced diet and regular exercise encouraged.    Please continue follow up with care team as directed.   Follow up in 6 months   You may receive a survey regarding today's visit. I encourage you to leave honest feed back as I do use this information to improve patient care. Thank you for seeing me today!       Memory Compensation Strategies  1. Use "WARM" strategy.  W= write it down  A= associate it  R= repeat it  M= make a mental note  2.   You can keep a Social worker.  Use a 3-ring notebook with sections for the following: calendar, important names and phone numbers,  medications, doctors' names/phone numbers, lists/reminders, and a section to journal what you did  each day.   3.    Use a calendar to write appointments down.  4.    Write yourself a schedule for the day.  This can be placed on the calendar or in a separate section of the Memory Notebook.  Keeping a  regular schedule can help memory.  5.    Use medication organizer with sections for each day or morning/evening pills.  You may need help loading it  6.    Keep a basket, or pegboard by the door.  Place items that you need to take out with you in the basket or on the pegboard.  You may also want to  include a message board for reminders.  7.    Use sticky notes.  Place sticky notes with reminders in a place where the task is performed.  For example: " turn off the  stove" placed by the stove, "lock the door" placed on the door at eye level, " take your medications" on  the bathroom mirror or by the place where you normally take your medications.  8.    Use alarms/timers.  Use while cooking to remind yourself to check on food or as a reminder to take your medicine, or as a  reminder to make a call, or as a reminder to  perform another task, etc.   Dementia Dementia is a condition that affects the way the brain works. It often affects memory and thinking. There are many types of dementia. Some types get worse with time and cannot be reversed. Some types of dementia include:  Alzheimer's disease. This is the most common type.  Vascular dementia. This type may happen due to a stroke.  Lewy body dementia. This type may happen to people who have Parkinson's disease.  Frontotemporal dementia. This type is caused by damage to nerve cells in certain parts of the brain. Some people may have more than one type, and this is called mixed dementia. What are the causes? This condition is caused by damage to cells in the brain. Some causes that cannot be reversed include:  Having a condition that affects the blood vessels of the brain, such as diabetes, heart disease, or blood vessel disease.  Changes to genes. Some causes that can be reversed or slowed include:  Injury to the brain.  Certain medicines.  Infection.  Not having enough vitamin B12 in the body, or thyroid problems.  A tumor or blood clot in the brain. What are the signs or symptoms? Symptoms depend on  the type of dementia. This may include:  Problems remembering things.  Having trouble taking a bath or putting clothes on.  Forgetting appointments.  Forgetting to pay bills.  Trouble planning and making meals.  Having trouble speaking.  Getting lost easily. How is this treated? Treatment depends on the cause of the dementia. It might include taking medicines that help:  To control the dementia.  To slow down the dementia.  To manage symptoms. In some cases, treating the cause of your dementia can improve symptoms, reverse symptoms, or slow down how quickly it gets worse. Your doctor can help you find support groups and other doctors who can help with your care. Follow these instructions at home: Medicines  Take  over-the-counter and prescription medicines only as told by your doctor.  Use a pill organizer to help you manage your medicines.  Avoidtaking medicines for pain or for sleep. Lifestyle  Make healthy choices: ? Be active as told by your doctor. ? Do not use any products that contain nicotine or tobacco, such as cigarettes, e-cigarettes, and chewing tobacco. If you need help quitting, ask your doctor. ? Do not drink alcohol. ? When you get stressed, do something that will help you to relax. Your doctor can give you tips. ? Spend time with other people.  Make sure you get good sleep. To get good sleep: ? Try not to take naps during the day. ? Keep your bedroom dark and cool. ? In the few hours before you go to bed, try not to do any exercise. ? Do not have foods and drinks with caffeine at night. Eating and drinking  Drink enough fluid to keep your pee (urine) pale yellow.  Eat a healthy diet. General instructions   Talk with your doctor to figure out: ? What you need help with. ? What your safety needs are.  Ask your doctor if it is safe for you to drive.  If told, wear a bracelet that tracks where you are or shows that you are a person with memory loss.  Work with your family to make big decisions.  Keep all follow-up visits as told by your doctor. This is important. Contact a doctor if:  You have any new symptoms.  Your symptoms get worse.  You have problems with swallowing or choking. Get help right away if:  You feel very sad, or feel that you want to harm yourself.  You or your family members are worried for your safety. If you ever feel like you may hurt yourself or others, or have thoughts about taking your own life, get help right away. You can go to your nearest emergency department or call:  Your local emergency services (911 in the [place]  A suicide crisis helpline, such as the [facility] at 272-739-4937. This is open 24  hours a day. Summary  Dementia often affects memory and thinking.  Some types of dementia get worse with time and cannot be reversed.  Treatment for this condition depends on the cause.  Talk with your doctor to figure out what you need help with.  Your doctor can help you find support groups and other doctors who can help with your care. This information is not intended to replace advice given to you by your health care provider. Make sure you discuss any questions you have with your health care provider. Document Revised: 12/10/2018 Document Reviewed: 12/10/2018 Elsevier Patient Education  [employer].   Dementia Caregiver Guide Dementia is  a term used to describe a number of symptoms that affect memory and thinking. The most common symptoms include:  Memory loss.  Trouble with language and communication.  Trouble concentrating.  Poor judgment.  Problems with reasoning.  Child-like behavior and language.  Extreme anxiety.  Angry outbursts.  Wandering from home or public places. Dementia usually gets worse slowly over time. In the early stages, people with dementia can stay independent and safe with some help. In later stages, they need help with daily tasks such as dressing, grooming, and using the bathroom. How to help the person with dementia cope Dementia can be frightening and confusing. Here are some tips to help the person with dementia cope with changes caused by the disease. General tips  Keep the person on track with his or her routine.  Try to identify areas where the person may need help.  Be supportive, patient, calm, and encouraging.  Gently remind the person that adjusting to changes takes time.  Help with the tasks that the person has asked for help with.  Keep the person involved in daily tasks and decisions as much as possible.  Encourage conversation, but try not to get frustrated or harried if the person struggles to find words or does  not seem to appreciate your help. Communication tips  When the person is talking or seems frustrated, make eye contact and hold the person's hand.  Ask specific questions that need yes or no answers.  Use simple words, short sentences, and a calm voice. Only give one direction at a time.  When offering choices, limit them to just 1 or 2.  Avoid correcting the person in a negative way.  If the person is struggling to find the right words, gently try to help him or her. How to recognize symptoms of stress Symptoms of stress in caregivers include:  Feeling frustrated or angry with the person with dementia.  Denying that the person has dementia or that his or her symptoms will not improve.  Feeling hopeless and unappreciated.  Difficulty sleeping.  Difficulty concentrating.  Feeling anxious, irritable, or depressed.  Developing stress-related health problems.  Feeling like you have too little time for your own life. Follow these instructions at home:   Make sure that you and the person you are caring for: ? Get regular sleep. ? Exercise regularly. ? Eat regular, nutritious meals. ? Drink enough fluid to keep your urine clear or pale yellow. ? Take over-the-counter and prescription medicines only as told by your health care providers. ? Attend all scheduled health care appointments.  Join a support group with others who are caregivers.  Ask about respite care resources so that you can have a regular break from the stress of caregiving.  Look for signs of stress in yourself and in the person you are caring for. If you notice signs of stress, take steps to manage it.  Consider any safety risks and take steps to avoid them.  Organize medications in a pill box for each day of the week.  Create a plan to handle any legal or financial matters. Get legal or financial advice if needed.  Keep a calendar in a central location to remind the person of appointments or other  activities. Tips for reducing the risk of injury  Keep floors clear of clutter. Remove rugs, magazine racks, and floor lamps.  Keep hallways well lit, especially at night.  Put a handrail and nonslip mat in the bathtub or shower.  Put childproof  locks on cabinets that contain dangerous items, such as medicines, alcohol, guns, toxic cleaning items, sharp tools or utensils, matches, and lighters.  Put the locks in places where the person cannot see or reach them easily. This will help ensure that the person does not wander out of the house and get lost.  Be prepared for emergencies. Keep a list of emergency phone numbers and addresses in a convenient area.  Remove car keys and lock garage doors so that the person does not try to get in the car and drive.  Have the person wear a bracelet that tracks locations and identifies the person as having memory problems. This should be worn at all times for safety. Where to find support: Many individuals and organizations offer support. These include:  Support groups for people with dementia and for caregivers.  Counselors or therapists.  Home health care services.  Adult day care centers. Where to find more information Alzheimer's [employer]: [employer] Contact a health care provider if:  The person's health is rapidly getting worse.  You are no longer able to care for the person.  Caring for the person is affecting your physical and emotional health.  The person threatens himself or herself, you, or anyone else. Summary  Dementia is a term used to describe a number of symptoms that affect memory and thinking.  Dementia usually gets worse slowly over time.  Take steps to reduce the person's risk of injury, and to plan for future care.  Caregivers need support, relief from caregiving, and time for their own lives. This information is not intended to replace advice given to you by your health care provider. Make sure you discuss  any questions you have with your health care provider. Document Revised: 09/07/2017 Document Reviewed: 08/29/2016 Elsevier Patient Education  2020 [employer].

## 2020-09-06 NOTE — Progress Notes (Signed)
Chief Complaint  Patient presents with  . Follow-up    rm 16  . Alzheimer    pt said she is here for a f/u on memory. Pt said some days are better than others.     HISTORY OF PRESENT ILLNESS: Today 09/06/20  Pam Knapp is a 74 y.o. female here today for follow up for AD.  Compliance report she reports that she is doing fairly well today.  She presents with her daughter who aids in history.  She has continued Aricept 10 mg daily and Namenda 5 mg twice daily.  She is tolerating medications without any obvious adverse effects.  She feels that she has good days and bad days.  She continues to live alone.  Her grandson checks on her daily.  He assist her with medications.  Her daughter face times every day to ensure that she has taken her medications.  She is able to perform ADLs independently.  She does not drive.  She is walking for 30 minutes every day with her neighbor.  She uses a cane.  No falls.   HISTORY (copied from previous note)  Pam Knapp is a 74 y.o. female here today for follow up for dementia. She is feels that she is doing well. She feels good and is staying active. She has tolerated increased dose of donepezil. She has continues bupropion as well. Her daughter feels that these medications have been helpful. She lives alone. She is able perform ADL's independently. She is eating normally. She drinks plenty of fluids. Her grandson assists her with medication. She does not drive. Her daughter lives out of state. They face time regularly. PCP placed referral for home health eval. She exercises regularly. No falls. She does use a cane for long distances.     REVIEW OF SYSTEMS: Out of a complete 14 system review of symptoms, the patient complains only of the following symptoms, memory loss, vision impairment and all other reviewed systems are negative.   ALLERGIES: No Known Allergies   HOME MEDICATIONS: Outpatient Medications Prior to Visit  Medication Sig  Dispense Refill  . amLODipine (NORVASC) 10 MG tablet TAKE 1 TABLET BY MOUTH EVERY DAY 90 tablet 1  . aspirin 325 MG tablet Take 325 mg by mouth as needed.     Marland Kitchen buPROPion (WELLBUTRIN XL) 150 MG 24 hr tablet TAKE 1 TABLET BY MOUTH EVERY DAY 90 tablet 0  . Cholecalciferol (VITAMIN D) 125 MCG (5000 UT) CAPS Take 1 capsule by mouth daily.    . Cranberry-Cholecalciferol 4200-500 MG-UNIT CAPS Take 1 capsule by mouth daily.    Marland Kitchen donepezil (ARICEPT) 10 MG tablet TAKE 1 TABLET BY MOUTH EVERYDAY AT BEDTIME 90 tablet 0  . doxazosin (CARDURA) 4 MG tablet TAKE 1 TABLET BY MOUTH EVERY DAY 90 tablet 0  . fexofenadine (ALLEGRA) 180 MG tablet Take 180 mg by mouth daily.    . Latanoprostene Bunod (VYZULTA) 0.024 % SOLN Apply to eye daily.    . Loperamide HCl (IMODIUM PO) Take by mouth at bedtime.    . memantine (NAMENDA) 5 MG tablet Take 1 tablet (5 mg total) by mouth 2 (two) times daily. 60 tablet 11  . Omega-3 Fatty Acids (OMEGA 3 PO) Take 750 mg by mouth daily.    . RESTASIS 0.05 % ophthalmic emulsion 1 drop 2 (two) times daily.    Marland Kitchen spironolactone (ALDACTONE) 25 MG tablet TAKE 1 TABLET BY MOUTH TWICE A DAY 180 tablet 1  . Turmeric (  QC TUMERIC COMPLEX PO) Take 15 mLs by mouth daily.    . vitamin C (ASCORBIC ACID) 500 MG tablet Take 500 mg by mouth daily.    . cetirizine (ZYRTEC) 10 MG tablet Take 10 mg by mouth as needed for allergies.     No facility-administered medications prior to visit.     PAST MEDICAL HISTORY: Past Medical History:  Diagnosis Date  . ADHD   . Arthritis   . Cataracts, bilateral   . Dementia ([place])   . Depression   . Glaucoma   . Hypertension   . Polymyalgia rheumatica ([place])      PAST SURGICAL HISTORY: Past Surgical History:  Procedure Laterality Date  . PARTIAL HIP ARTHROPLASTY Right 2011  . REPLACEMENT TOTAL KNEE Left 2013   Dr Jodell Cipro     FAMILY HISTORY: Family History  Problem Relation Age of Onset  . Diabetes Mother   . Kidney failure Mother   . High  blood pressure Sister   . Neuropathy Sister   . Heart disease Brother   . Asthma Sister   . Sleep apnea Sister   . Bipolar disorder Sister   . Diabetes Sister   . Diabetes Sister   . Breast cancer Sister   . Fibromyalgia Daughter   . Colon cancer Other   . Stomach cancer Other   . Rectal cancer Other   . Esophageal cancer Other      SOCIAL HISTORY: Social History   Socioeconomic History  . Marital status: Widowed    Spouse name: Not on file  . Number of children: 1  . Years of education: BS  . Highest education level: Not on file  Occupational History  . Occupation: Retired  Tobacco Use  . Smoking status: Former Smoker    Packs/day: 0.50    Years: 20.00    Pack years: 10.00  . Smokeless tobacco: Never Used  . Tobacco comment: quit over 20 years ago  Vaping Use  . Vaping Use: Never used  Substance and Sexual Activity  . Alcohol use: Not Currently  . Drug use: Never  . Sexual activity: Not on file  Other Topics Concern  . Not on file  Social History Narrative   Lives alone   Caffeine use: 2 coffees per day   Right handed       Social History      Diet? Regular diet       Do you drink/eat things with caffeine? Yes coffee      Marital status?                widow                    What year were you married? [place] you live in a house, apartment, assisted living, condo, trailer, etc.? Apartment       Is it one or more stories? one      How many persons live in your home? one      Do you have any pets in your home? (please list) cockatiel       Highest level of education completed? 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    yes  Type & how often? Daily chair aerobics - water aerobics       Advanced Directives      Do you have a living will? No       Do you have a DNR form?            no                      If not, do you want to discuss one? yes      Do you have  signed POA/HPOA for forms? No       Functional Status      Do you have difficulty bathing or dressing yourself? No       Do you have difficulty preparing food or eating? No       Do you have difficulty managing your medications? yes      Do you have difficulty managing your finances? Yes daughter manages       Do you have difficulty affording your medications? no      Social Determinants of Health   Financial Resource Strain:   . Difficulty of Paying Living Expenses: Not on file  Food Insecurity:   . Worried About Charity fundraiser in the Last Year: Not on file  . Ran Out of Food in the Last Year: Not on file  Transportation Needs:   . Lack of Transportation (Medical): Not on file  . Lack of Transportation (Non-Medical): Not on file  Physical Activity:   . Days of Exercise per Week: Not on file  . Minutes of Exercise per Session: Not on file  Stress:   . Feeling of Stress : Not on file  Social Connections:   . Frequency of Communication with Friends and Family: Not on file  . Frequency of Social Gatherings with Friends and Family: Not on file  . Attends Religious Services: Not on file  . Active Member of Clubs or Organizations: Not on file  . Attends Archivist Meetings: Not on file  . Marital Status: Not on file  Intimate Partner Violence:   . Fear of Current or Ex-Partner: Not on file  . Emotionally Abused: Not on file  . Physically Abused: Not on file  . Sexually Abused: Not on file      PHYSICAL EXAM  Vitals:   09/06/20 0902  BP: 140/72  Pulse: 71  Weight: 174 lb (78.9 kg)  Height: 5\' 8"  (1.727 m)   Body mass index is 26.46 kg/m.   Generalized: Well developed, in no acute distress  Cardiology: normal rate and rhythm, no murmur auscultated  Respiratory: clear to auscultation bilaterally    Neurological examination  Mentation: Alert oriented to time, place, history taking. Follows most commands speech and language fluent Cranial nerve  II-XII: Pupils were equal round reactive to light. Extraocular movements were full, visual field were full on confrontational test. Facial sensation and strength were normal. Head turning and shoulder shrug  were normal and symmetric. Motor: The motor testing reveals 5 over 5 strength of all 4 extremities. Good symmetric motor tone is noted throughout.  Sensory: Sensory testing is intact to soft touch on all 4 extremities. No evidence of extinction is noted.  Coordination: Cerebellar testing reveals reduced (does not understand instructions) finger-nose-finger and good heel-to-shin bilaterally.  Gait and station: Gait is stable with cane    DIAGNOSTIC DATA (LABS, IMAGING, TESTING) - I reviewed patient records, labs, notes, testing and imaging myself where available.  Lab Results  Component Value Date   WBC 4.4 11/18/2019   HGB 13.3 11/18/2019   HCT 39.4 11/18/2019   MCV 95.2 11/18/2019   PLT 164 11/18/2019      Component Value Date/Time   NA 139 11/18/2019 0852   K 4.2 11/18/2019 0852   CL 105 11/18/2019 0852   CO2 25 11/18/2019 0852   GLUCOSE 98 11/18/2019 0852   BUN 9 11/18/2019 0852   CREATININE 0.87 11/18/2019 0852   CALCIUM 9.9 11/18/2019 0852   PROT 7.5 11/18/2019 0852   ALBUMIN 3.9 11/17/2009 1457   AST 15 11/18/2019 0852   ALT 8 11/18/2019 0852   ALKPHOS 77 11/17/2009 1457   BILITOT 0.5 11/18/2019 0852   GFRNONAA 66 11/18/2019 0852   GFRAA 76 11/18/2019 0852   Lab Results  Component Value Date   CHOL 197 11/18/2019   HDL 57 11/18/2019   LDLCALC 121 (H) 11/18/2019   TRIG 86 11/18/2019   CHOLHDL 3.5 11/18/2019   Lab Results  Component Value Date   HGBA1C 5.6 12/06/2018   Lab Results  Component Value Date   VITAMINB12 652 11/18/2018   Lab Results  Component Value Date   TSH 2.26 11/18/2019   MMSE - Mini Mental State Exam 09/06/2020  Orientation to time 3  Orientation to Place 4  Registration 2  Attention/ Calculation 2  Recall 0  Language- name 2  objects 2  Language- repeat 1  Language- follow 3 step command 3  Language- read & follow direction 1  Write a sentence 1  Copy design 0  Total score 19    Montreal Cognitive Assessment  03/03/2020 09/03/2019 09/03/2019 11/18/2018  Visuospatial/ Executive (0/5) 3 1 1 2   Naming (0/3) 2 2 2 2   Attention: Read list of digits (0/2) 1 1 1 2   Attention: Read list of letters (0/1) 1 1 1 1   Attention: Serial 7 subtraction starting at 100 (0/3) 1 1 1 1   Language: Repeat phrase (0/2) 2 1 1 1   Language : Fluency (0/1) 1 0 0 0  Abstraction (0/2) 1 2 2 2   Delayed Recall (0/5) 0 0 0 0  Orientation (0/6) 4 4 4 5   Total 16 13 13 16   Adjusted Score (based on education) - - 49 17      ASSESSMENT AND PLAN  74 y.o. year old female  has a past medical history of ADHD, Arthritis, Cataracts, bilateral, Dementia ([place]), Depression, Glaucoma, Hypertension, and Polymyalgia rheumatica ([place]). here with   No diagnosis found.  Pam Knapp is doing well, today. Memory loss continues to wax and wane, however, she feels memory loss is fairly stable.  MMSE performed today 19 of 30.  Previously had MoCA of 16/30. I have offered to repeat MoCA but daughter states that she seemed much less anxious with MMSE.  She has tolerated Aricept and Namenda. We will continue Arcipet 10mg  daily and increase Namenda to 10mg  twice daily. She was advised on possible side effects and will call with any concerns. She will continue healthy lifestyle habits. Safety precautions discussed with daughter. She will follow up with me in 6 months. She and her daughter verbalize understanding and agreement with this plan.   I spent 20 minutes of face-to-face and non-face-to-face time with patient.  This included previsit chart review, lab review, study review, order entry, electronic health record documentation, patient education.    Debbora Presto, MSN, FNP-C 09/06/2020, 9:37 AM  [facility] [address], [place],  [place] [address] [phone]

## 2020-09-06 NOTE — Progress Notes (Signed)
I have read the note, and I agree with the clinical assessment and plan.  Alura Olveda A. Chizara Mena, MD, PhD, FAAN Certified in Neurology, Clinical Neurophysiology, Sleep Medicine, Pain Medicine and Neuroimaging  [facility] [address], [address] [place], [place] [address] [phone]  

## 2020-09-07 ENCOUNTER — Inpatient Hospital Stay: Payer: PPO | Admitting: Orthopaedic Surgery

## 2020-09-09 DIAGNOSIS — H04123 Dry eye syndrome of bilateral lacrimal glands: Secondary | ICD-10-CM | POA: Diagnosis not present

## 2020-09-09 DIAGNOSIS — H43812 Vitreous degeneration, left eye: Secondary | ICD-10-CM | POA: Diagnosis not present

## 2020-09-09 DIAGNOSIS — H5333 Simultaneous visual perception without fusion: Secondary | ICD-10-CM | POA: Diagnosis not present

## 2020-09-09 DIAGNOSIS — H401132 Primary open-angle glaucoma, bilateral, moderate stage: Secondary | ICD-10-CM | POA: Diagnosis not present

## 2020-09-09 DIAGNOSIS — H2513 Age-related nuclear cataract, bilateral: Secondary | ICD-10-CM | POA: Diagnosis not present

## 2020-09-09 DIAGNOSIS — H402212 Chronic angle-closure glaucoma, right eye, moderate stage: Secondary | ICD-10-CM | POA: Diagnosis not present

## 2020-09-09 DIAGNOSIS — G3183 Dementia with Lewy bodies: Secondary | ICD-10-CM | POA: Diagnosis not present

## 2020-09-09 DIAGNOSIS — H53453 Other localized visual field defect, bilateral: Secondary | ICD-10-CM | POA: Diagnosis not present

## 2020-09-10 ENCOUNTER — Other Ambulatory Visit: Payer: Self-pay | Admitting: *Deleted

## 2020-09-10 MED ORDER — DOXAZOSIN MESYLATE 4 MG PO TABS: 4.0000 mg | ORAL_TABLET | Freq: Every day | ORAL | 0 refills | Status: DC

## 2020-09-10 NOTE — Telephone Encounter (Signed)
Received refill Request from [facility]  Patient needs an appointment before anymore future refill.

## 2020-09-14 ENCOUNTER — Other Ambulatory Visit: Payer: Self-pay | Admitting: *Deleted

## 2020-09-14 DIAGNOSIS — I10 Essential (primary) hypertension: Secondary | ICD-10-CM

## 2020-09-14 MED ORDER — AMLODIPINE BESYLATE 10 MG PO TABS: 10.0000 mg | ORAL_TABLET | Freq: Every day | ORAL | 0 refills | Status: DC

## 2020-09-14 MED ORDER — SPIRONOLACTONE 25 MG PO TABS: 25.0000 mg | ORAL_TABLET | Freq: Two times a day (BID) | ORAL | 0 refills | Status: DC

## 2020-09-14 NOTE — Telephone Encounter (Signed)
Pharmacy requested refills. Notated that patient needs an appointment before anymore Future Refills.   Pended Rx and sent to [facility] for approval due to [place].

## 2020-09-14 NOTE — Telephone Encounter (Signed)
Please follow up appointment for medical management of chromic issues

## 2020-09-15 ENCOUNTER — Other Ambulatory Visit: Payer: Self-pay | Admitting: *Deleted

## 2020-09-15 MED ORDER — BUPROPION HCL ER (XL) 150 MG PO TB24
150.0000 mg | ORAL_TABLET | Freq: Every day | ORAL | 1 refills | Status: DC
Start: 2020-09-15 — End: 2020-10-19

## 2020-09-27 ENCOUNTER — Telehealth: Payer: Self-pay | Admitting: Family Medicine

## 2020-09-27 NOTE — Telephone Encounter (Signed)
I called daughter.  Pt is now up in [place], and she feels that pt has digressed more confusion, depressed, anxiety, hard time processing understanding, some fearful.. last seen 11-9-21with AL/NP.  Increased memantine to 10mg  po bid,  Was on donepezil 10mg  po daily.  Daughter concerned.  I relayed that ALZ progressive disease.  If taken out of familiar surroundings can definitely make a difference in mental status for dementia pts.  If an acute issue of confusion could be under lying infection.  She is not showing sx of uti or infection she states.  She will be back in Jan 5,2022 to [place] her home.  Any other reccs?

## 2020-09-27 NOTE — Telephone Encounter (Signed)
I agree with everything you told her.  Unfortunately, the medications for Alzheimer's are not very effective and the disease continues to progress.

## 2020-09-27 NOTE — Telephone Encounter (Signed)
Pt's daughter [name] on [place] called wanting to speak to the RN regarding the pt's memory getting worse. Please advise.

## 2020-09-28 NOTE — Telephone Encounter (Signed)
I called and relayed to daughter that per Dr. Felecia Shelling no medications really help dementia, its just a progressive disease.  I relayed that most likely different area/place but of course is acutely affected see urgent care.  She stated that her mood is better, felt like it was more anxiety/depressed, but was better today.  Got more phone calls from family and activities to do. Appreciated call back.

## 2020-10-19 ENCOUNTER — Other Ambulatory Visit (INDEPENDENT_AMBULATORY_CARE_PROVIDER_SITE_OTHER): Payer: PPO

## 2020-10-19 ENCOUNTER — Other Ambulatory Visit: Payer: Self-pay

## 2020-10-19 ENCOUNTER — Ambulatory Visit (INDEPENDENT_AMBULATORY_CARE_PROVIDER_SITE_OTHER): Payer: PPO | Admitting: Family

## 2020-10-19 ENCOUNTER — Encounter: Payer: Self-pay | Admitting: Family

## 2020-10-19 VITALS — BP 128/68 | HR 65 | Temp 96.6°F | Resp 18 | Ht 68.0 in | Wt 171.0 lb

## 2020-10-19 DIAGNOSIS — R2681 Unsteadiness on feet: Secondary | ICD-10-CM | POA: Diagnosis not present

## 2020-10-19 DIAGNOSIS — G629 Polyneuropathy, unspecified: Secondary | ICD-10-CM | POA: Diagnosis not present

## 2020-10-19 DIAGNOSIS — R410 Disorientation, unspecified: Secondary | ICD-10-CM

## 2020-10-19 DIAGNOSIS — M25552 Pain in left hip: Secondary | ICD-10-CM

## 2020-10-19 DIAGNOSIS — R399 Unspecified symptoms and signs involving the genitourinary system: Secondary | ICD-10-CM | POA: Diagnosis not present

## 2020-10-19 DIAGNOSIS — G8929 Other chronic pain: Secondary | ICD-10-CM

## 2020-10-19 DIAGNOSIS — F331 Major depressive disorder, recurrent, moderate: Secondary | ICD-10-CM | POA: Diagnosis not present

## 2020-10-19 DIAGNOSIS — H5340 Unspecified visual field defects: Secondary | ICD-10-CM | POA: Diagnosis not present

## 2020-10-19 LAB — POCT URINALYSIS DIPSTICK
Blood, UA: NEGATIVE
Glucose, UA: NEGATIVE
Ketones, UA: NEGATIVE
Leukocytes, UA: NEGATIVE
Nitrite, UA: NEGATIVE
Protein, UA: NEGATIVE
Spec Grav, UA: 1.025 (ref 1.010–1.025)
Urobilinogen, UA: 0.2 E.U./dL
pH, UA: 6 (ref 5.0–8.0)

## 2020-10-19 MED ORDER — BUPROPION HCL ER (XL) 300 MG PO TB24: 300.0000 mg | ORAL_TABLET | Freq: Every day | ORAL | 3 refills | Status: DC

## 2020-10-19 MED ORDER — GABAPENTIN 100 MG PO CAPS: 100.0000 mg | ORAL_CAPSULE | Freq: Every day | ORAL | 3 refills | Status: DC

## 2020-10-19 NOTE — Progress Notes (Addendum)
Provider: [name]  Tymeshia Awan, [name], NP  Patient Care Team: Shantanu Strauch, [name], NP as PCP - General (Family Medicine) Saporito, Maree Erie, LCSW as [place] [employer] [name], MD as Consulting Physician (Ophthalmology) Felecia Shelling, Nanine Means, MD (Neurology)  Extended Emergency Contact Information Primary Emergency Contact: [name] Address: [place] [address] [name] Work Phone: [phone] Mobile Phone: [phone] Relation: Daughter Secondary Emergency Contact: [name],[name]  [place] Phone: [phone] Relation: Grandson  Code Status:  Full Code  Goals of care: Advanced Directive information Advanced Directives 04/25/2019  Does Patient Have a Medical Advance Directive? No  Would patient like information on creating a medical advance directive? No - Patient declined     Chief Complaint  Patient presents with  . Acute Visit    Patient not sure why she is here. Schedule states confusion, depression, pain "in different parts of my body."  States "inflammation" and parts of her body "feel heat." Burning with urination.     HPI:  Pt is a 75 y.o. female seen today for an acute visit for evaluation of increased confusion.she is here with her daughter who states mother is getting more confused for the past several days.she complains of Burning with urination and urine frequency.does have some discomfort on her lower abdomen.she denies any fever,chills,urgency,hematuria or back pain.Not drinking enough water. Also complains of worsening numbness and tingling sensation on left leg and left chronic hip pain.Has had left hip arthroplasty.she denies any weakness on the leg.gets worst with cold weather.   Past Medical History:  Diagnosis Date  . ADHD   . Arthritis   . Cataracts, bilateral   . Dementia ([place])   . Depression   . Glaucoma   . Hypertension   . Polymyalgia  rheumatica (HCC)    Past Surgical History:  Procedure Laterality Date  . PARTIAL HIP ARTHROPLASTY Right 2011  . REPLACEMENT TOTAL KNEE Left 2013   Dr Jodell Cipro    No Known Allergies  Outpatient Encounter Medications as of 10/19/2020  Medication Sig  . amLODipine (NORVASC) 10 MG tablet Take 1 tablet (10 mg total) by mouth daily. Needs an appointment before anymore Future Refills.  Marland Kitchen aspirin 325 MG tablet Take 325 mg by mouth as needed.   . brimonidine-timolol (COMBIGAN) 0.2-0.5 % ophthalmic solution Place 1 drop into both eyes every 12 (twelve) hours.  . Cholecalciferol (VITAMIN D) 125 MCG (5000 UT) CAPS Take 1 capsule by mouth daily.  . Cranberry-Cholecalciferol 4200-500 MG-UNIT CAPS Take 1 capsule by mouth daily.  Marland Kitchen donepezil (ARICEPT) 10 MG tablet Take one tablet daily at bedtime  . doxazosin (CARDURA) 4 MG tablet Take 1 tablet (4 mg total) by mouth daily. Needs an appointment before anymore future refills.  . fexofenadine (ALLEGRA) 180 MG tablet Take 180 mg by mouth daily.  Marland Kitchen gabapentin (NEURONTIN) 100 MG capsule Take 1 capsule (100 mg total) by mouth at bedtime.  . Latanoprostene Bunod (VYZULTA) 0.024 % SOLN Apply to eye daily.  . Loperamide HCl (IMODIUM PO) Take by mouth at bedtime.  . memantine (NAMENDA) 10 MG tablet Take 1 tablet (10 mg total) by mouth 2 (two) times daily.  . Omega-3 Fatty Acids (OMEGA 3 PO) Take 750 mg by mouth daily.  . RESTASIS 0.05 % ophthalmic emulsion 1 drop 2 (two) times daily.  Marland Kitchen spironolactone (ALDACTONE) 25 MG tablet Take 1 tablet (25 mg total) by  mouth 2 (two) times daily. Needs an appointment before anymore Future Refills.  . Turmeric (QC TUMERIC COMPLEX PO) Take 15 mLs by mouth daily.  . vitamin C (ASCORBIC ACID) 500 MG tablet Take 500 mg by mouth daily.  . [DISCONTINUED] buPROPion (WELLBUTRIN XL) 150 MG 24 hr tablet Take 1 tablet (150 mg total) by mouth daily.  Marland Kitchen buPROPion (WELLBUTRIN XL) 300 MG 24 hr tablet Take 1 tablet (300 mg total) by mouth  daily.   No facility-administered encounter medications on file as of 10/19/2020.    Review of Systems  Constitutional: Negative for chills, fatigue and fever.  HENT: Negative for congestion, rhinorrhea, sinus pressure, sinus pain, sneezing and sore throat.   Respiratory: Negative for cough, chest tightness, shortness of breath and wheezing.   Cardiovascular: Negative for chest pain, palpitations and leg swelling.  Gastrointestinal: Negative for abdominal distention, abdominal pain, diarrhea, nausea and vomiting.  Genitourinary: Positive for dysuria and frequency. Negative for difficulty urinating, flank pain and urgency.  Musculoskeletal: Positive for arthralgias and gait problem. Negative for back pain and joint swelling.  Skin: Negative for color change, pallor and rash.  Neurological: Negative for dizziness, speech difficulty, weakness, light-headedness, numbness and headaches.  Psychiatric/Behavioral: Positive for confusion. Negative for agitation and sleep disturbance. The patient is not nervous/anxious.     Immunization History  Administered Date(s) Administered  . Influenza, High Dose Seasonal PF 07/29/2017, 08/30/2018, 08/14/2019  . Influenza-Unspecified 08/12/2020  . PFIZER(Purple Top)SARS-COV-2 Vaccination 11/20/2019, 12/15/2019, 09/09/2020  . Pneumococcal Conjugate-13 09/09/2019  . Pneumococcal Polysaccharide-23 11/22/2016  . Zoster Recombinat (Shingrix) 10/22/2018   Pertinent  Health Maintenance Due  Topic Date Due  . DEXA SCAN  Never done  . COLONOSCOPY (Pts 45-59yrs Insurance coverage will need to be confirmed)  10/26/2029  . INFLUENZA VACCINE  Completed  . PNA vac Low Risk Adult  Completed   Fall Risk  10/19/2020 12/22/2019 09/09/2019 04/25/2019 12/06/2018  Falls in the past year? 0 0 0 1 1  Number falls in past yr: 0 0 - 0 0  Injury with Fall? - 0 - 0 0  Risk for fall due to : - - - - -  Follow up - - - - -   Functional Status Survey:    Vitals:   10/19/20 1553   BP: 128/68  Pulse: 65  Resp: 18  Temp: (!) 96.6 F (35.9 C)  SpO2: 98%  Weight: 171 lb (77.6 kg)  Height: 5\' 8"  (1.727 m)   Body mass index is 26 kg/m. Physical Exam Vitals reviewed.  Constitutional:      General: She is not in acute distress.    Appearance: She is overweight. She is not ill-appearing.  HENT:     Head: Normocephalic.  Eyes:     General: No scleral icterus.       Right eye: No discharge.        Left eye: No discharge.     Extraocular Movements: Extraocular movements intact.     Conjunctiva/sclera: Conjunctivae normal.     Pupils: Pupils are equal, round, and reactive to light.  Neck:     Vascular: No carotid bruit.  Cardiovascular:     Rate and Rhythm: Normal rate and regular rhythm.     Pulses: Normal pulses.     Heart sounds: Normal heart sounds. No murmur heard. No friction rub. No gallop.   Pulmonary:     Effort: Pulmonary effort is normal. No respiratory distress.     Breath sounds: Normal breath sounds.  No wheezing, rhonchi or rales.  Chest:     Chest wall: No tenderness.  Abdominal:     General: Bowel sounds are normal. There is no distension.     Palpations: Abdomen is soft. There is no mass.     Tenderness: There is no abdominal tenderness. There is no right CVA tenderness, left CVA tenderness, guarding or rebound.  Musculoskeletal:        General: No swelling or tenderness.     Cervical back: Normal range of motion. No rigidity or tenderness.     Right lower leg: No edema.     Left lower leg: No edema.     Comments: Unsteady gait   Lymphadenopathy:     Cervical: No cervical adenopathy.  Skin:    General: Skin is warm and dry.     Coloration: Skin is not pale.     Findings: No bruising or erythema.  Neurological:     Mental Status: She is alert. Mental status is at baseline.     Cranial Nerves: No cranial nerve deficit.     Sensory: No sensory deficit.     Motor: No weakness.     Coordination: Coordination normal.     Gait: Gait  normal.  Psychiatric:        Mood and Affect: Mood normal.        Speech: Speech normal.        Behavior: Behavior normal.        Thought Content: Thought content normal.        Cognition and Memory: Memory is impaired.    Labs reviewed: Recent Labs    11/18/19 0852  NA 139  K 4.2  CL 105  CO2 25  GLUCOSE 98  BUN 9  CREATININE 0.87  CALCIUM 9.9   Recent Labs    11/18/19 0852  AST 15  ALT 8  BILITOT 0.5  PROT 7.5   Recent Labs    11/18/19 0852  WBC 4.4  NEUTROABS 2,644  HGB 13.3  HCT 39.4  MCV 95.2  PLT 164   Lab Results  Component Value Date   TSH 2.26 11/18/2019   Lab Results  Component Value Date   HGBA1C 5.6 12/06/2018   Lab Results  Component Value Date   CHOL 197 11/18/2019   HDL 57 11/18/2019   LDLCALC 121 (H) 11/18/2019   TRIG 86 11/18/2019   CHOLHDL 3.5 11/18/2019    Significant Diagnostic Results in last 30 days:  No results found.  Assessment/Plan 1. Symptoms of urinary tract infection Afebrile.Reports urine frequency and dysuria.Increased confusion reported by daughter. - encouraged to increase water intake to 6-8 glasses of water daily  - urine dip stick shows pale yellow clear urine positive for bilirubin but negative for blood,nitrites or Leukocytes.send for culture due to clinical symptoms.Aware culture takes three days then will call with results.  - Urine Culture  2. Unsteady gait No recent fall episode. Encouraged to uses a cane.   3. Moderate episode of recurrent major depressive disorder (HCC) Reports worsening feeling of depression.No suicidal ideation,injury to self or others. - advised to increase wellbutrin from 150 mg 24 hr tablet to 300 mg tablet.Advised to take two tablet until she completes current tablet then take one 300 mg tablet.  - buPROPion (WELLBUTRIN XL) 300 MG 24 hr tablet; Take 1 tablet (300 mg total) by mouth daily.  Dispense: 30 tablet; Refill: 3  4. Neuropathy Burning sensation worst on left leg.No  weakness reported.sensation intact  on exam.Will add Gabapentin as below.side effects discussed.  - gabapentin (NEURONTIN) 100 MG capsule; Take 1 capsule (100 mg total) by mouth at bedtime.  Dispense: 90 capsule; Refill: 3  5. Hip pain, chronic, left Chronic.add Gabapentin continue on Extra strength Tylenol.  - gabapentin (NEURONTIN) 100 MG capsule; Take 1 capsule (100 mg total) by mouth at bedtime.  Dispense: 90 capsule; Refill: 3  6. Confusion  Daughter reports increase forgetfulness.suspect this is due to her dementia but will rule out Urinary tact infection as above. - continue on Donepezil and memantine.  Family/ staff Communication: Reviewed plan of care with patient and daughter verbalized understanding.   Labs/tests ordered: - Urine Culture  Next Appointment: As needed if symptoms worsen or fail to improve.   Sandrea Hughs, NP

## 2020-10-19 NOTE — Patient Instructions (Signed)
-   increase Wellbutrin to 300 mg tablet daily. - Notify provider if symptoms worsen

## 2020-10-20 LAB — URINE CULTURE
MICRO NUMBER:: 11405382
SPECIMEN QUALITY:: ADEQUATE

## 2020-10-21 ENCOUNTER — Telehealth: Payer: Self-pay

## 2020-10-21 DIAGNOSIS — R399 Unspecified symptoms and signs involving the genitourinary system: Secondary | ICD-10-CM

## 2020-10-21 NOTE — Telephone Encounter (Signed)
Discussed results with patients daughter Vonzella Nipple verbalized understanding of results and stated she will call back to schedule lab appointment for urine sample recollection for urine culture.

## 2020-10-21 NOTE — Telephone Encounter (Signed)
-----   Message from Sandrea Hughs, NP sent at 10/21/2020  9:01 AM EST ----- Urine culture indicate mixed flora possible urine contamination recommend urine recollection.

## 2020-10-29 ENCOUNTER — Other Ambulatory Visit: Payer: Self-pay | Admitting: Family

## 2020-10-29 DIAGNOSIS — I10 Essential (primary) hypertension: Secondary | ICD-10-CM

## 2020-10-30 ENCOUNTER — Other Ambulatory Visit: Payer: Self-pay | Admitting: Family

## 2020-10-30 DIAGNOSIS — I10 Essential (primary) hypertension: Secondary | ICD-10-CM

## 2020-11-06 ENCOUNTER — Other Ambulatory Visit: Payer: Self-pay | Admitting: Family

## 2020-11-11 ENCOUNTER — Other Ambulatory Visit: Payer: Self-pay | Admitting: Family

## 2020-11-11 DIAGNOSIS — F331 Major depressive disorder, recurrent, moderate: Secondary | ICD-10-CM

## 2020-11-15 DIAGNOSIS — H268 Other specified cataract: Secondary | ICD-10-CM | POA: Diagnosis not present

## 2020-11-15 DIAGNOSIS — H401132 Primary open-angle glaucoma, bilateral, moderate stage: Secondary | ICD-10-CM | POA: Diagnosis not present

## 2020-11-15 DIAGNOSIS — H2511 Age-related nuclear cataract, right eye: Secondary | ICD-10-CM | POA: Diagnosis not present

## 2020-11-16 DIAGNOSIS — H401132 Primary open-angle glaucoma, bilateral, moderate stage: Secondary | ICD-10-CM | POA: Diagnosis not present

## 2020-11-16 DIAGNOSIS — H2512 Age-related nuclear cataract, left eye: Secondary | ICD-10-CM | POA: Diagnosis not present

## 2020-11-16 DIAGNOSIS — H5333 Simultaneous visual perception without fusion: Secondary | ICD-10-CM | POA: Diagnosis not present

## 2020-11-16 DIAGNOSIS — G3183 Dementia with Lewy bodies: Secondary | ICD-10-CM | POA: Diagnosis not present

## 2020-11-16 DIAGNOSIS — Z961 Presence of intraocular lens: Secondary | ICD-10-CM | POA: Diagnosis not present

## 2020-11-16 DIAGNOSIS — H43812 Vitreous degeneration, left eye: Secondary | ICD-10-CM | POA: Diagnosis not present

## 2020-11-16 DIAGNOSIS — H53453 Other localized visual field defect, bilateral: Secondary | ICD-10-CM | POA: Diagnosis not present

## 2020-11-16 DIAGNOSIS — H402212 Chronic angle-closure glaucoma, right eye, moderate stage: Secondary | ICD-10-CM | POA: Diagnosis not present

## 2020-11-16 DIAGNOSIS — H04123 Dry eye syndrome of bilateral lacrimal glands: Secondary | ICD-10-CM | POA: Diagnosis not present

## 2020-11-19 ENCOUNTER — Other Ambulatory Visit: Payer: Self-pay

## 2020-11-19 ENCOUNTER — Encounter: Payer: Self-pay | Admitting: Family

## 2020-11-19 ENCOUNTER — Ambulatory Visit (INDEPENDENT_AMBULATORY_CARE_PROVIDER_SITE_OTHER): Payer: PPO | Admitting: Family

## 2020-11-19 VITALS — BP 120/60 | HR 60 | Temp 96.9°F | Resp 16 | Ht 68.0 in | Wt 169.8 lb

## 2020-11-19 DIAGNOSIS — Z23 Encounter for immunization: Secondary | ICD-10-CM

## 2020-11-19 DIAGNOSIS — G301 Alzheimer's disease with late onset: Secondary | ICD-10-CM

## 2020-11-19 DIAGNOSIS — F331 Major depressive disorder, recurrent, moderate: Secondary | ICD-10-CM

## 2020-11-19 DIAGNOSIS — I10 Essential (primary) hypertension: Secondary | ICD-10-CM | POA: Diagnosis not present

## 2020-11-19 DIAGNOSIS — F0280 Dementia in other diseases classified elsewhere without behavioral disturbance: Secondary | ICD-10-CM | POA: Diagnosis not present

## 2020-11-19 MED ORDER — TETANUS-DIPHTH-ACELL PERTUSSIS 5-2-15.5 LF-MCG/0.5 IM SUSP: 0.5000 mL | Freq: Once | INTRAMUSCULAR | 0 refills | Status: AC

## 2020-11-19 NOTE — Progress Notes (Signed)
Provider: [name]  [name], [name], NP  Patient Care Team: [name], [name], NP as PCP - General (Family Medicine) Saporito, Maree Erie, LCSW as [facility] [employer] [name], MD as Consulting Physician (Ophthalmology) Felecia Shelling, Nanine Means, MD (Neurology)  Extended Emergency Contact Information Primary Emergency Contact: [name] Address: [place] [address] [name] Work Phone: [phone] Mobile Phone: [phone] Relation: Daughter Secondary Emergency Contact: [name],[name]  [place] Phone: [phone] Relation: Grandson  Code Status:  Full Code  Goals of care: Advanced Directive information Advanced Directives 11/19/2020  Does Patient Have a Medical Advance Directive? Yes  Type of Advance Directive Out of facility DNR (pink MOST or yellow form)  Does patient want to make changes to medical advance directive? No - Patient declined  Would patient like information on creating a medical advance directive? -     Chief Complaint  Patient presents with  . Medical Management of Chronic Issues    1 month follow up.  Marland Kitchen Health Maintenance    Discuss the need for Dexa Scan.  . Immunizations    Discuss the need for Tetanus Vaccine.    HPI:  Pt is a 75 y.o. female seen today for an acute visit for evaluation of depression.she was seen 10/19/2020 Wellbutrin was increased to 300 mg tablet daily.she is here with her daughter.she states since Wellbutrin was increased has felt much better.she denies any side effects.  She due for Tdap vaccine. Also due for Dexa Scan.    Past Medical History:  Diagnosis Date  . ADHD   . Arthritis   . Cataracts, bilateral   . Dementia ([place])   . Depression   . Glaucoma   . Hypertension   . Polymyalgia rheumatica (HCC)    Past Surgical History:  Procedure Laterality Date  . PARTIAL HIP ARTHROPLASTY Right 2011  . REPLACEMENT TOTAL KNEE  Left 2013   Dr Jodell Cipro    No Known Allergies  Outpatient Encounter Medications as of 11/19/2020  Medication Sig  . amLODipine (NORVASC) 10 MG tablet TAKE 1 TABLET BY MOUTH EVERY DAY  . aspirin 325 MG tablet Take 325 mg by mouth as needed.   . brimonidine-timolol (COMBIGAN) 0.2-0.5 % ophthalmic solution Place 1 drop into both eyes every 12 (twelve) hours.  . Bromfenac Sodium (PROLENSA) 0.07 % SOLN Apply 1 drop to eye daily at 12 noon. Right Eye.  Marland Kitchen buPROPion (WELLBUTRIN XL) 300 MG 24 hr tablet Take 1 tablet (300 mg total) by mouth daily.  . Cholecalciferol (VITAMIN D) 125 MCG (5000 UT) CAPS Take 1 capsule by mouth daily.  . Cranberry-Cholecalciferol 4200-500 MG-UNIT CAPS Take 1 capsule by mouth daily.  Marland Kitchen donepezil (ARICEPT) 10 MG tablet Take one tablet daily at bedtime  . doxazosin (CARDURA) 4 MG tablet TAKE 1 TABLET BY MOUTH EVERY DAY  . fexofenadine (ALLEGRA) 180 MG tablet Take 180 mg by mouth daily.  Marland Kitchen gabapentin (NEURONTIN) 100 MG capsule Take 1 capsule (100 mg total) by mouth at bedtime.  . Latanoprostene Bunod (VYZULTA) 0.024 % SOLN Apply to eye daily.  . Loperamide HCl (IMODIUM PO) Take by mouth at bedtime.  . Loteprednol Etabonate (LOTEMAX SM) 0.38 % GEL Apply 1 drop to eye 3 (three) times daily. Right Eye  . memantine (NAMENDA) 10 MG tablet Take 1 tablet (10 mg total) by mouth 2 (two) times daily.  Marland Kitchen ofloxacin (OCUFLOX) 0.3 %  ophthalmic solution Place 1 drop into the right eye 4 (four) times daily.  . Omega-3 Fatty Acids (OMEGA 3 PO) Take 750 mg by mouth daily.  Marland Kitchen spironolactone (ALDACTONE) 25 MG tablet TAKE 1 TABLET BY MOUTH TWICE A DAY  . Turmeric (QC TUMERIC COMPLEX PO) Take 15 mLs by mouth daily.  . vitamin C (ASCORBIC ACID) 500 MG tablet Take 500 mg by mouth daily.  Marland Kitchen XIIDRA 5 % SOLN Place 1 drop into the right eye 2 (two) times daily.  . [DISCONTINUED] RESTASIS 0.05 % ophthalmic emulsion 1 drop 2 (two) times daily.   No facility-administered encounter medications on file  as of 11/19/2020.    Review of Systems  Constitutional: Negative for appetite change, chills, fatigue and fever.  Respiratory: Negative for cough, chest tightness, shortness of breath and wheezing.   Cardiovascular: Negative for chest pain, palpitations and leg swelling.  Gastrointestinal: Negative for abdominal distention, abdominal pain, constipation, diarrhea, nausea and vomiting.  Musculoskeletal: Positive for arthralgias and gait problem. Negative for joint swelling and myalgias.  Skin: Negative for color change, pallor and rash.  Neurological: Negative for dizziness, speech difficulty, weakness, light-headedness, numbness and headaches.  Psychiatric/Behavioral: Negative for agitation, behavioral problems and sleep disturbance. The patient is not nervous/anxious.     Immunization History  Administered Date(s) Administered  . Influenza, High Dose Seasonal PF 07/29/2017, 08/30/2018, 08/14/2019  . Influenza-Unspecified 08/12/2020  . PFIZER(Purple Top)SARS-COV-2 Vaccination 11/20/2019, 12/15/2019, 09/09/2020  . Pneumococcal Conjugate-13 09/09/2019  . Pneumococcal Polysaccharide-23 11/22/2016  . Zoster Recombinat (Shingrix) 10/22/2018   Pertinent  Health Maintenance Due  Topic Date Due  . DEXA SCAN  Never done  . COLONOSCOPY (Pts 45-3yrs Insurance coverage will need to be confirmed)  10/26/2029  . INFLUENZA VACCINE  Completed  . PNA vac Low Risk Adult  Completed   Fall Risk  11/19/2020 10/19/2020 12/22/2019 09/09/2019 04/25/2019  Falls in the past year? 0 0 0 0 1  Number falls in past yr: 0 0 0 - 0  Injury with Fall? 0 - 0 - 0  Risk for fall due to : - - - - -  Follow up - - - - -   Functional Status Survey:    Vitals:   11/19/20 1028  BP: 120/60  Pulse: 60  Resp: 16  Temp: (!) 96.9 F (36.1 C)  SpO2: 98%  Weight: 169 lb 12.8 oz (77 kg)  Height: $Remove'5\' 8"'[id number]$  (1.727 m)   Body mass index is 25.82 kg/m. Physical Exam Vitals reviewed.  Constitutional:      General: She is not  in acute distress.    Appearance: She is overweight. She is not ill-appearing.  HENT:     Head: Normocephalic.     Right Ear: Tympanic membrane, ear canal and external ear normal. There is no impacted cerumen.     Left Ear: Tympanic membrane, ear canal and external ear normal. There is no impacted cerumen.     Nose: Nose normal. No congestion or rhinorrhea.     Mouth/Throat:     Mouth: Mucous membranes are moist.     Pharynx: Oropharynx is clear. No oropharyngeal exudate or posterior oropharyngeal erythema.  Eyes:     General: No scleral icterus.       Right eye: No discharge.        Left eye: No discharge.     Extraocular Movements: Extraocular movements intact.     Conjunctiva/sclera: Conjunctivae normal.     Pupils: Pupils are equal, round, and reactive to  light.  Neck:     Vascular: No carotid bruit.  Cardiovascular:     Rate and Rhythm: Normal rate and regular rhythm.     Pulses: Normal pulses.     Heart sounds: Normal heart sounds. No murmur heard. No friction rub. No gallop.   Pulmonary:     Effort: Pulmonary effort is normal. No respiratory distress.     Breath sounds: Normal breath sounds. No wheezing, rhonchi or rales.  Chest:     Chest wall: No tenderness.  Abdominal:     General: Bowel sounds are normal. There is no distension.     Palpations: Abdomen is soft. There is no mass.     Tenderness: There is no abdominal tenderness. There is no right CVA tenderness, left CVA tenderness, guarding or rebound.  Musculoskeletal:        General: No swelling or tenderness.     Cervical back: Normal range of motion. No rigidity or tenderness.     Right lower leg: No edema.     Left lower leg: No edema.     Comments: Unsteady gait   Lymphadenopathy:     Cervical: No cervical adenopathy.  Skin:    General: Skin is warm and dry.     Coloration: Skin is not pale.     Findings: No bruising, erythema, lesion or rash.  Neurological:     Mental Status: She is alert and oriented  to person, place, and time.     Cranial Nerves: No cranial nerve deficit.     Sensory: No sensory deficit.     Motor: No weakness.     Coordination: Coordination normal.     Gait: Gait abnormal.     Labs reviewed: No results for input(s): NA, K, CL, CO2, GLUCOSE, BUN, CREATININE, CALCIUM, MG, PHOS in the last 8760 hours. No results for input(s): AST, ALT, ALKPHOS, BILITOT, PROT, ALBUMIN in the last 8760 hours. No results for input(s): WBC, NEUTROABS, HGB, HCT, MCV, PLT in the last 8760 hours. Lab Results  Component Value Date   TSH 2.26 11/18/2019   Lab Results  Component Value Date   HGBA1C 5.6 12/06/2018   Lab Results  Component Value Date   CHOL 197 11/18/2019   HDL 57 11/18/2019   LDLCALC 121 (H) 11/18/2019   TRIG 86 11/18/2019   CHOLHDL 3.5 11/18/2019    Significant Diagnostic Results in last 30 days:  No results found.  Assessment/Plan 1. Moderate episode of recurrent major depressive disorder (HCC) Mood has improved with dose adjustment of wellbutrin - continue on Wellbutrin 300 mg tablet daily   2. Need for Tdap vaccination Advised to get Tdap vaccine at her pharmacy - Tdap (ADACEL) 02-07-14.5 LF-MCG/0.5 injection; Inject 0.5 mLs into the muscle once for 1 dose.  Dispense: 0.5 mL; Refill: 0  3. Late onset Alzheimer's disease without behavioral disturbance ([place]) Continue with supportive care. Continue on Donepezil and memantine.   4. Essential hypertension B/p well controlled. - continue on Amlodipine,doxazosin 4 mg tablet and spironolactone  - CBC with Differential/Platelet; Future - CMP with eGFR([facility]); Future - TSH; Future - Lipid panel; Future  Family/ staff Communication: Reviewed plan of care with patient  Labs/tests ordered:  - CBC with Differential/Platelet; Future - CMP with eGFR([facility]); Future - TSH; Future - Lipid panel; Future  Next Appointment: 6 months for medical management of chronic issues.Fasting labs 2-4 days prior to visit.    Sandrea Hughs, NP

## 2020-11-19 NOTE — Patient Instructions (Signed)
Continue with current medication.

## 2020-12-02 DIAGNOSIS — H04123 Dry eye syndrome of bilateral lacrimal glands: Secondary | ICD-10-CM | POA: Diagnosis not present

## 2020-12-02 DIAGNOSIS — H43812 Vitreous degeneration, left eye: Secondary | ICD-10-CM | POA: Diagnosis not present

## 2020-12-02 DIAGNOSIS — H2512 Age-related nuclear cataract, left eye: Secondary | ICD-10-CM | POA: Diagnosis not present

## 2020-12-02 DIAGNOSIS — H401132 Primary open-angle glaucoma, bilateral, moderate stage: Secondary | ICD-10-CM | POA: Diagnosis not present

## 2020-12-02 DIAGNOSIS — H402212 Chronic angle-closure glaucoma, right eye, moderate stage: Secondary | ICD-10-CM | POA: Diagnosis not present

## 2020-12-02 DIAGNOSIS — H5333 Simultaneous visual perception without fusion: Secondary | ICD-10-CM | POA: Diagnosis not present

## 2020-12-02 DIAGNOSIS — G3183 Dementia with Lewy bodies: Secondary | ICD-10-CM | POA: Diagnosis not present

## 2020-12-02 DIAGNOSIS — Z961 Presence of intraocular lens: Secondary | ICD-10-CM | POA: Diagnosis not present

## 2020-12-02 DIAGNOSIS — H53453 Other localized visual field defect, bilateral: Secondary | ICD-10-CM | POA: Diagnosis not present

## 2020-12-14 NOTE — Progress Notes (Deleted)
Office Visit Note  Patient: Pam Knapp             Date of Birth: September 08, 1946           MRN: [id number]             PCP: Sandrea Hughs, NP Referring: Sandrea Hughs, NP Visit Date: 12/28/2020 Occupation: @GUAROCC @  Subjective:  No chief complaint on file.   History of Present Illness: Pam Knapp is a 75 y.o. female ***   Activities of Daily Living:  Patient reports morning stiffness for *** {minute/hour:19697}.   Patient {ACTIONS;DENIES/REPORTS:21021675::"Denies"} nocturnal pain.  Difficulty dressing/grooming: {ACTIONS;DENIES/REPORTS:21021675::"Denies"} Difficulty climbing stairs: {ACTIONS;DENIES/REPORTS:21021675::"Denies"} Difficulty getting out of chair: {ACTIONS;DENIES/REPORTS:21021675::"Denies"} Difficulty using hands for taps, buttons, cutlery, and/or writing: {ACTIONS;DENIES/REPORTS:21021675::"Denies"}  No Rheumatology ROS completed.   PMFS History:  Patient Active Problem List   Diagnosis Date Noted  . Carpal tunnel syndrome, left upper limb 07/08/2020  . Carpal tunnel syndrome, right upper limb 07/08/2020  . Alzheimers disease ([place]) 02/18/2019  . Polymyalgia rheumatica ([place]) 12/06/2018  . Body mass index 28.0-28.9, adult 12/06/2018  . Essential hypertension 12/06/2018  . Memory loss 11/18/2018  . Visual disturbance 11/18/2018  . Attention deficit 11/18/2018  . Depression 11/18/2018    Past Medical History:  Diagnosis Date  . ADHD   . Arthritis   . Cataracts, bilateral   . Dementia ([place])   . Depression   . Glaucoma   . Hypertension   . Polymyalgia rheumatica (H[facility])     Family History  Problem Relation Age of Onset  . Diabetes Mother   . Kidney failure Mother   . High blood pressure Sister   . Neuropathy Sister   . Heart disease Brother   . Asthma Sister   . Sleep apnea Sister   . Bipolar disorder Sister   . Diabetes Sister   . Diabetes Sister   . Breast cancer Sister   . Fibromyalgia Daughter   . Colon cancer Other   .  Stomach cancer Other   . Rectal cancer Other   . Esophageal cancer Other    Past Surgical History:  Procedure Laterality Date  . PARTIAL HIP ARTHROPLASTY Right 2011  . REPLACEMENT TOTAL KNEE Left 2013   Dr Jodell Cipro   Social History   Social History Narrative   Lives alone   Caffeine use: 2 coffees per day   Right handed       Social History      Diet? Regular diet       Do you drink/eat things with caffeine? Yes coffee      Marital status?                widow                    What year were you married? [place] you live in a house, apartment, assisted living, condo, trailer, etc.? Apartment       Is it one or more stories? one      How many persons live in your home? one      Do you have any pets in your home? (please list) cockatiel       Highest level of education completed? 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    yes  Type & how often? Daily chair aerobics - water aerobics       Advanced Directives      Do you have a living will? No       Do you have a DNR form?            no                      If not, do you want to discuss one? yes      Do you have signed POA/HPOA for forms? No       Functional Status      Do you have difficulty bathing or dressing yourself? No       Do you have difficulty preparing food or eating? No       Do you have difficulty managing your medications? yes      Do you have difficulty managing your finances? Yes daughter manages       Do you have difficulty affording your medications? no      Immunization History  Administered Date(s) Administered  . Influenza, High Dose Seasonal PF 07/29/2017, 08/30/2018, 08/14/2019  . Influenza-Unspecified 08/12/2020  . PFIZER(Purple Top)SARS-COV-2 Vaccination 11/20/2019, 12/15/2019, 09/09/2020  . Pneumococcal Conjugate-13 09/09/2019  . Pneumococcal Polysaccharide-23 11/22/2016  . Zoster Recombinat  (Shingrix) 10/22/2018     Objective: Vital Signs: There were no vitals taken for this visit.   Physical Exam   Musculoskeletal Exam: ***  CDAI Exam: CDAI Score: -- Patient Global: --; Provider Global: -- Swollen: --; Tender: -- Joint Exam 12/28/2020   No joint exam has been documented for this visit   There is currently no information documented on the homunculus. Go to the Rheumatology activity and complete the homunculus joint exam.  Investigation: No additional findings.  Imaging: No results found.  Recent Labs: Lab Results  Component Value Date   WBC 4.4 11/18/2019   HGB 13.3 11/18/2019   PLT 164 11/18/2019   NA 139 11/18/2019   K 4.2 11/18/2019   CL 105 11/18/2019   CO2 25 11/18/2019   GLUCOSE 98 11/18/2019   BUN 9 11/18/2019   CREATININE 0.87 11/18/2019   BILITOT 0.5 11/18/2019   ALKPHOS 77 11/17/2009   AST 15 11/18/2019   ALT 8 11/18/2019   PROT 7.5 11/18/2019   ALBUMIN 3.9 11/17/2009   CALCIUM 9.9 11/18/2019   GFRAA 76 11/18/2019    Speciality Comments: No specialty comments available.  Procedures:  No procedures performed Allergies: Patient has no known allergies.   Assessment / Plan:     Visit Diagnoses: No diagnosis found.  Orders: No orders of the defined types were placed in this encounter.  No orders of the defined types were placed in this encounter.   Face-to-face time spent with patient was *** minutes. Greater than 50% of time was spent in counseling and coordination of care.  Follow-Up Instructions: No follow-ups on file.   Earnestine Mealing, CMA  Note - This record has been created using Editor, commissioning.  Chart creation errors have been sought, but may not always  have been located. Such creation errors do not reflect on  the standard of medical care.

## 2020-12-28 ENCOUNTER — Ambulatory Visit: Payer: PPO | Admitting: Rheumatology

## 2020-12-28 DIAGNOSIS — Z96652 Presence of left artificial knee joint: Secondary | ICD-10-CM

## 2020-12-28 DIAGNOSIS — G5603 Carpal tunnel syndrome, bilateral upper limbs: Secondary | ICD-10-CM

## 2020-12-28 DIAGNOSIS — M5030 Other cervical disc degeneration, unspecified cervical region: Secondary | ICD-10-CM

## 2020-12-28 DIAGNOSIS — I10 Essential (primary) hypertension: Secondary | ICD-10-CM

## 2020-12-28 DIAGNOSIS — F0280 Dementia in other diseases classified elsewhere without behavioral disturbance: Secondary | ICD-10-CM

## 2020-12-28 DIAGNOSIS — R41840 Attention and concentration deficit: Secondary | ICD-10-CM

## 2020-12-28 DIAGNOSIS — M3501 Sicca syndrome with keratoconjunctivitis: Secondary | ICD-10-CM

## 2020-12-28 DIAGNOSIS — Z96641 Presence of right artificial hip joint: Secondary | ICD-10-CM

## 2020-12-28 DIAGNOSIS — H539 Unspecified visual disturbance: Secondary | ICD-10-CM

## 2020-12-28 DIAGNOSIS — M353 Polymyalgia rheumatica: Secondary | ICD-10-CM

## 2021-01-17 DIAGNOSIS — H401122 Primary open-angle glaucoma, left eye, moderate stage: Secondary | ICD-10-CM | POA: Diagnosis not present

## 2021-01-17 DIAGNOSIS — H25812 Combined forms of age-related cataract, left eye: Secondary | ICD-10-CM | POA: Diagnosis not present

## 2021-01-17 DIAGNOSIS — H401132 Primary open-angle glaucoma, bilateral, moderate stage: Secondary | ICD-10-CM | POA: Diagnosis not present

## 2021-01-18 ENCOUNTER — Telehealth: Payer: Self-pay | Admitting: Family Medicine

## 2021-01-18 NOTE — Telephone Encounter (Signed)
Pt's daughter, [name] (on [place]) called, want to see if she can get a earlier appt. Would like a call from the nurse.

## 2021-01-18 NOTE — Telephone Encounter (Signed)
LMVM for pts daughter to return call earlier appt. I called daughter at her #.  Placed on list for cancellation on AL/NP schedule.  She appreciated this. (if not that was ok).

## 2021-01-23 ENCOUNTER — Other Ambulatory Visit: Payer: Self-pay | Admitting: Family

## 2021-01-26 ENCOUNTER — Ambulatory Visit: Payer: PPO | Admitting: Family

## 2021-01-27 ENCOUNTER — Ambulatory Visit (INDEPENDENT_AMBULATORY_CARE_PROVIDER_SITE_OTHER): Payer: PPO | Admitting: Family

## 2021-01-27 ENCOUNTER — Encounter: Payer: Self-pay | Admitting: Family

## 2021-01-27 ENCOUNTER — Other Ambulatory Visit: Payer: Self-pay

## 2021-01-27 VITALS — BP 160/68 | HR 58 | Temp 97.7°F | Resp 16 | Ht 68.0 in | Wt 164.8 lb

## 2021-01-27 DIAGNOSIS — M25512 Pain in left shoulder: Secondary | ICD-10-CM

## 2021-01-27 DIAGNOSIS — G8929 Other chronic pain: Secondary | ICD-10-CM | POA: Diagnosis not present

## 2021-01-27 DIAGNOSIS — M25552 Pain in left hip: Secondary | ICD-10-CM | POA: Diagnosis not present

## 2021-01-27 DIAGNOSIS — M542 Cervicalgia: Secondary | ICD-10-CM | POA: Diagnosis not present

## 2021-01-27 MED ORDER — MELOXICAM 7.5 MG PO TABS: 7.5000 mg | ORAL_TABLET | Freq: Every day | ORAL | 0 refills | Status: DC

## 2021-01-27 MED ORDER — DICLOFENAC SODIUM 1 % EX GEL: 2.0000 g | Freq: Four times a day (QID) | CUTANEOUS | 3 refills | Status: DC

## 2021-01-27 MED ORDER — SALONPAS PAIN RELIEF PATCH EX PTCH: 1.0000 "application " | MEDICATED_PATCH | Freq: Every day | CUTANEOUS | 3 refills | Status: DC

## 2021-01-27 NOTE — Progress Notes (Signed)
Provider: [name]  Ayjah Show, [name], NP  Patient Care Team: Ji Feldner, [name], NP as PCP - General (Family Medicine) Saporito, Maree Erie, LCSW as [facility] [name], MD as Consulting Physician (Ophthalmology) Felecia Shelling, Nanine Means, MD (Neurology)  Extended Emergency Contact Information Primary Emergency Contact: [name] Address: [place] [address] [name] Work Phone: [phone] Mobile Phone: [phone] Relation: Daughter Secondary Emergency Contact: [name],[name]  [place] Phone: [phone] Relation: Grandson  Code Status:  Full Code  Goals of care: Advanced Directive information Advanced Directives 01/27/2021  Does Patient Have a Medical Advance Directive? Yes  Type of Advance Directive Out of facility DNR (pink MOST or yellow form);[place];Living will  Does patient want to make changes to medical advance directive? No - Patient declined  Copy of [place] in Chart? Yes - validated most recent copy scanned in chart (See row information)  Would patient like information on creating a medical advance directive? -     Chief Complaint  Patient presents with  . Acute Visit    Complains of pain on left side.    HPI:  Pt is a 75 y.o. female seen today for an acute visit for evaluation of left side pain from neck,shoulder and down to the left leg.Pain woke her up on Monday night 01/24/2021.  Neck pain seems to be worsening.States has been diagnosed with Polymyalgia rheumatica and was on prednisone for several years but was able to get off.denies any morning stiffness.Has not seen a rheumatologist for several years.    Past Medical History:  Diagnosis Date  . ADHD   . Arthritis   . Cataracts, bilateral   . Dementia ([place])   . Depression   . Glaucoma   . Hypertension   . Polymyalgia rheumatica ([facility])    Past Surgical  History:  Procedure Laterality Date  . PARTIAL HIP ARTHROPLASTY Right 2011  . REPLACEMENT TOTAL KNEE Left 2013   Dr Jodell Cipro    No Known Allergies  Outpatient Encounter Medications as of 01/27/2021  Medication Sig  . amLODipine (NORVASC) 10 MG tablet TAKE 1 TABLET BY MOUTH EVERY DAY  . aspirin 325 MG tablet Take 325 mg by mouth as needed.   . brimonidine-timolol (COMBIGAN) 0.2-0.5 % ophthalmic solution Place 1 drop into both eyes every 12 (twelve) hours.  . Bromfenac Sodium (PROLENSA) 0.07 % SOLN Apply 1 drop to eye daily at 12 noon. Right Eye.  Marland Kitchen buPROPion (WELLBUTRIN XL) 300 MG 24 hr tablet Take 1 tablet (300 mg total) by mouth daily.  . Cholecalciferol (VITAMIN D) 125 MCG (5000 UT) CAPS Take 1 capsule by mouth daily.  . Cranberry-Cholecalciferol 4200-500 MG-UNIT CAPS Take 1 capsule by mouth daily.  Marland Kitchen donepezil (ARICEPT) 10 MG tablet Take one tablet daily at bedtime  . doxazosin (CARDURA) 4 MG tablet TAKE 1 TABLET BY MOUTH EVERY DAY  . fexofenadine (ALLEGRA) 180 MG tablet Take 180 mg by mouth daily.  Marland Kitchen gabapentin (NEURONTIN) 100 MG capsule Take 1 capsule (100 mg total) by mouth at bedtime.  . Latanoprostene Bunod (VYZULTA) 0.024 % SOLN Apply to eye daily.  . Loperamide HCl (IMODIUM PO) Take by mouth at bedtime.  . Loteprednol Etabonate (LOTEMAX SM) 0.38 % GEL Apply 1 drop to eye 3 (three) times daily. Right Eye  . memantine (NAMENDA) 10 MG tablet Take 1 tablet (10 mg total)  by mouth 2 (two) times daily.  Marland Kitchen ofloxacin (OCUFLOX) 0.3 % ophthalmic solution Place 1 drop into the right eye 4 (four) times daily.  . Omega-3 Fatty Acids (OMEGA 3 PO) Take 750 mg by mouth daily.  Marland Kitchen spironolactone (ALDACTONE) 25 MG tablet TAKE 1 TABLET BY MOUTH TWICE A DAY  . Turmeric (QC TUMERIC COMPLEX PO) Take 15 mLs by mouth daily.  . vitamin C (ASCORBIC ACID) 500 MG tablet Take 500 mg by mouth daily.  Marland Kitchen XIIDRA 5 % SOLN Place 1 drop into the right eye 2 (two) times daily.   No facility-administered encounter  medications on file as of 01/27/2021.    Review of Systems  Constitutional: Negative for appetite change, chills, fatigue and fever.  Respiratory: Negative for cough, chest tightness, shortness of breath and wheezing.   Cardiovascular: Negative for chest pain, palpitations and leg swelling.  Musculoskeletal: Positive for arthralgias, back pain, gait problem and neck pain. Negative for joint swelling, myalgias and neck stiffness.  Neurological: Negative for dizziness, light-headedness and headaches.       Left carpel tunnel     Immunization History  Administered Date(s) Administered  . Influenza, High Dose Seasonal PF 07/29/2017, 08/30/2018, 08/14/2019  . Influenza-Unspecified 08/12/2020  . PFIZER(Purple Top)SARS-COV-2 Vaccination 11/20/2019, 12/15/2019, 09/09/2020  . Pneumococcal Conjugate-13 09/09/2019  . Pneumococcal Polysaccharide-23 11/22/2016  . Zoster Recombinat (Shingrix) 10/22/2018   Pertinent  Health Maintenance Due  Topic Date Due  . DEXA SCAN  Never done  . INFLUENZA VACCINE  05/09/2021  . COLONOSCOPY (Pts 45-9yrs Insurance coverage will need to be confirmed)  10/26/2029  . PNA vac Low Risk Adult  Completed   Fall Risk  01/27/2021 11/19/2020 10/19/2020 12/22/2019 09/09/2019  Falls in the past year? 0 0 0 0 0  Number falls in past yr: 0 0 0 0 -  Injury with Fall? 0 0 - 0 -  Risk for fall due to : - - - - -  Follow up - - - - -   Functional Status Survey:    Vitals:   01/27/21 1412  BP: (!) 160/68  Pulse: (!) 58  Resp: 16  Temp: 97.7 F (36.5 C)  SpO2: 98%  Weight: 164 lb 12.8 oz (74.8 kg)  Height: 5\' 8"  (1.727 m)   Body mass index is 25.06 kg/m. Physical Exam Vitals reviewed.  Constitutional:      General: She is not in acute distress.    Appearance: Normal appearance. She is normal weight. She is not ill-appearing or diaphoretic.  HENT:     Head: Normocephalic.     Nose: Nose normal. No congestion or rhinorrhea.     Mouth/Throat:     Mouth: Mucous  membranes are moist.     Pharynx: Oropharynx is clear. No oropharyngeal exudate or posterior oropharyngeal erythema.  Eyes:     General: No scleral icterus.       Right eye: No discharge.        Left eye: No discharge.     Conjunctiva/sclera: Conjunctivae normal.     Pupils: Pupils are equal, round, and reactive to light.  Neck:     Vascular: No carotid bruit.  Cardiovascular:     Rate and Rhythm: Normal rate and regular rhythm.     Pulses: Normal pulses.     Heart sounds: Normal heart sounds. No murmur heard. No friction rub. No gallop.   Pulmonary:     Effort: Pulmonary effort is normal. No respiratory distress.     Breath  sounds: Normal breath sounds. No wheezing, rhonchi or rales.  Chest:     Chest wall: No tenderness.  Abdominal:     General: Bowel sounds are normal. There is no distension.     Palpations: Abdomen is soft. There is no mass.     Tenderness: There is no abdominal tenderness. There is no right CVA tenderness, left CVA tenderness, guarding or rebound.  Musculoskeletal:        General: No swelling or tenderness.     Cervical back: Normal range of motion. No rigidity or tenderness.     Right lower leg: No edema.     Left lower leg: No edema.     Comments: Unsteady gait   Lymphadenopathy:     Cervical: No cervical adenopathy.  Skin:    General: Skin is warm and dry.     Coloration: Skin is not pale.     Findings: No bruising, erythema, lesion or rash.  Neurological:     Mental Status: She is alert and oriented to person, place, and time.     Cranial Nerves: No cranial nerve deficit.     Sensory: No sensory deficit.     Motor: No weakness.     Coordination: Coordination normal.     Gait: Gait normal.  Psychiatric:        Mood and Affect: Mood normal.        Speech: Speech normal.        Behavior: Behavior normal.        Thought Content: Thought content normal.        Judgment: Judgment normal.     Labs reviewed: No results for input(s): NA, K, CL,  CO2, GLUCOSE, BUN, CREATININE, CALCIUM, MG, PHOS in the last 8760 hours. No results for input(s): AST, ALT, ALKPHOS, BILITOT, PROT, ALBUMIN in the last 8760 hours. No results for input(s): WBC, NEUTROABS, HGB, HCT, MCV, PLT in the last 8760 hours. Lab Results  Component Value Date   TSH 2.26 11/18/2019   Lab Results  Component Value Date   HGBA1C 5.6 12/06/2018   Lab Results  Component Value Date   CHOL 197 11/18/2019   HDL 57 11/18/2019   LDLCALC 121 (H) 11/18/2019   TRIG 86 11/18/2019   CHOLHDL 3.5 11/18/2019    Significant Diagnostic Results in last 30 days:  No results found.  Assessment/Plan  1. Chronic neck pain Non-tender to palpation. Start on mobic and salonpas  - Menthol-Methyl Salicylate (SALONPAS PAIN RELIEF PATCH) [place]; Apply 1 application topically daily.  Dispense: 30 patch; Refill: 3 - meloxicam (MOBIC) 7.5 MG tablet; Take 1 tablet (7.5 mg total) by mouth daily.  Dispense: 30 tablet; Refill: 0  2. Hip pain, chronic, left Chronic  - Menthol-Methyl Salicylate (SALONPAS PAIN RELIEF PATCH) [facility]; Apply 1 application topically daily.  Dispense: 30 patch; Refill: 3 - meloxicam (MOBIC) 7.5 MG tablet; Take 1 tablet (7.5 mg total) by mouth daily.  Dispense: 30 tablet; Refill: 0  3. Chronic left shoulder pain Chronic Voltaren as below  - diclofenac Sodium (VOLTAREN) 1 % GEL; Apply 2 g topically 4 (four) times daily.  Dispense: 100 g; Refill: 3  Family/ staff Communication: Reviewed plan of care with patient verbalized understanding.  Labs/tests ordered: None   Next Appointment: As needed if symptoms worsen or fail to improve    Sandrea Hughs, NP

## 2021-02-19 ENCOUNTER — Other Ambulatory Visit: Payer: Self-pay | Admitting: Family

## 2021-02-19 DIAGNOSIS — F331 Major depressive disorder, recurrent, moderate: Secondary | ICD-10-CM

## 2021-02-20 ENCOUNTER — Other Ambulatory Visit: Payer: Self-pay | Admitting: Family

## 2021-02-20 DIAGNOSIS — G8929 Other chronic pain: Secondary | ICD-10-CM

## 2021-02-21 NOTE — Telephone Encounter (Signed)
Dinah refilled on 01/27/2021 with no additional refills, please advise if ok to provide additional refills and if so how many

## 2021-03-03 NOTE — Progress Notes (Signed)
Chief Complaint  Patient presents with  . Follow-up    RM 1, with daughter [name]. Pts daughter states her memory is declining, hallucination at night, trouble sleeping, and anxiety has worsen.      HISTORY OF PRESENT ILLNESS: 03/08/2021 ALL:  Pam Knapp returns for follow up for AD. We continued donepezil 10mg  daily and increased memantine to 10mg  BID at last visit 09/2020. She is tolerating medications. She continues to decline. She lives with grandson. Daughter in [place]. She is having more periods on confusion. She needs direction with ADLs. She is not sleeping as well. She is having more visual hallucinations, recently. Daughter reports that she will tell them someone was in the home when they know she was alone. Maybe delusions? No auditory hallucinations. She is eating normally. No accidents. No recent falls but does continue to use her cane. Daughter is requesting help at home. Grandson works and has a hard time helping with ADLs.    09/06/2020 ALL:  Pam Knapp is a 75 y.o. female here today for follow up for AD.  Compliance report she reports that she is doing fairly well today.  She presents with her daughter who aids in history.  She has continued Aricept 10 mg daily and Namenda 5 mg twice daily.  She is tolerating medications without any obvious adverse effects.  She feels that she has good days and bad days.  She continues to live alone.  Her grandson checks on her daily.  He assist her with medications.  Her daughter face times every day to ensure that she has taken her medications.  She is able to perform ADLs independently.  She does not drive.  She is walking for 30 minutes every day with her neighbor.  She uses a cane.  No falls.   HISTORY (copied from previous note)  Pam Knapp is a 75 y.o. female here today for follow up for dementia. She is feels that she is doing well. She feels good and is staying active. She has tolerated increased dose of  donepezil. She has continues bupropion as well. Her daughter feels that these medications have been helpful. She lives alone. She is able perform ADL's independently. She is eating normally. She drinks plenty of fluids. Her grandson assists her with medication. She does not drive. Her daughter lives out of state. They face time regularly. PCP placed referral for home health eval. She exercises regularly. No falls. She does use a cane for long distances.     REVIEW OF SYSTEMS: Out of a complete 14 system review of symptoms, the patient complains only of the following symptoms, memory loss, vision impairment and all other reviewed systems are negative.   ALLERGIES: No Known Allergies   HOME MEDICATIONS: Outpatient Medications Prior to Visit  Medication Sig Dispense Refill  . amLODipine (NORVASC) 10 MG tablet TAKE 1 TABLET BY MOUTH EVERY DAY 90 tablet 1  . aspirin 325 MG tablet Take 325 mg by mouth as needed.     . brimonidine-timolol (COMBIGAN) 0.2-0.5 % ophthalmic solution Place 1 drop into both eyes every 12 (twelve) hours.    . Bromfenac Sodium (PROLENSA) 0.07 % SOLN Apply 1 drop to eye daily at 12 noon. Right Eye.    Marland Kitchen buPROPion (WELLBUTRIN XL) 300 MG 24 hr tablet TAKE 1 TABLET BY MOUTH EVERY DAY 30 tablet 3  . Cholecalciferol (VITAMIN D) 125 MCG (5000 UT) CAPS Take 1 capsule by mouth daily.    . Cranberry-Cholecalciferol 4200-500 MG-UNIT  CAPS Take 1 capsule by mouth daily.    . diclofenac Sodium (VOLTAREN) 1 % GEL Apply 2 g topically 4 (four) times daily. 100 g 3  . donepezil (ARICEPT) 10 MG tablet Take one tablet daily at bedtime 90 tablet 3  . doxazosin (CARDURA) 4 MG tablet TAKE 1 TABLET BY MOUTH EVERY DAY 90 tablet 0  . fexofenadine (ALLEGRA) 180 MG tablet Take 180 mg by mouth daily.    Marland Kitchen gabapentin (NEURONTIN) 100 MG capsule Take 1 capsule (100 mg total) by mouth at bedtime. 90 capsule 3  . Latanoprostene Bunod (VYZULTA) 0.024 % SOLN Apply to eye daily.    . Loperamide HCl (IMODIUM  PO) Take by mouth at bedtime.    . meloxicam (MOBIC) 7.5 MG tablet TAKE 1 TABLET BY MOUTH EVERY DAY 30 tablet 0  . memantine (NAMENDA) 10 MG tablet Take 1 tablet (10 mg total) by mouth 2 (two) times daily. 180 tablet 3  . Menthol-Methyl Salicylate (SALONPAS PAIN RELIEF PATCH) PTCH Apply 1 application topically daily. 30 patch 3  . ofloxacin (OCUFLOX) 0.3 % ophthalmic solution Place 1 drop into the right eye 4 (four) times daily.    . Omega-3 Fatty Acids (OMEGA 3 PO) Take 750 mg by mouth daily.    Marland Kitchen spironolactone (ALDACTONE) 25 MG tablet TAKE 1 TABLET BY MOUTH TWICE A DAY 180 tablet 1  . Turmeric (QC TUMERIC COMPLEX PO) Take 15 mLs by mouth daily.    . vitamin C (ASCORBIC ACID) 500 MG tablet Take 500 mg by mouth daily.    Marland Kitchen XIIDRA 5 % SOLN Place 1 drop into the right eye 2 (two) times daily.    . Loteprednol Etabonate (LOTEMAX SM) 0.38 % GEL Apply 1 drop to eye 3 (three) times daily. Right Eye     No facility-administered medications prior to visit.     PAST MEDICAL HISTORY: Past Medical History:  Diagnosis Date  . ADHD   . Arthritis   . Cataracts, bilateral   . Dementia ([place])   . Depression   . Glaucoma   . Hypertension   . Polymyalgia rheumatica ([place])      PAST SURGICAL HISTORY: Past Surgical History:  Procedure Laterality Date  . PARTIAL HIP ARTHROPLASTY Right 2011  . REPLACEMENT TOTAL KNEE Left 2013   Dr Jodell Cipro     FAMILY HISTORY: Family History  Problem Relation Age of Onset  . Diabetes Mother   . Kidney failure Mother   . High blood pressure Sister   . Neuropathy Sister   . Heart disease Brother   . Asthma Sister   . Sleep apnea Sister   . Bipolar disorder Sister   . Diabetes Sister   . Diabetes Sister   . Breast cancer Sister   . Fibromyalgia Daughter   . Colon cancer Other   . Stomach cancer Other   . Rectal cancer Other   . Esophageal cancer Other      SOCIAL HISTORY: Social History   Socioeconomic History  . Marital status: Widowed     Spouse name: Not on file  . Number of children: 1  . Years of education: BS  . Highest education level: Not on file  Occupational History  . Occupation: Retired  Tobacco Use  . Smoking status: Former Smoker    Packs/day: 0.50    Years: 20.00    Pack years: 10.00  . Smokeless tobacco: Never Used  . Tobacco comment: quit over 20 years ago  Vaping Use  .  Vaping Use: Never used  Substance and Sexual Activity  . Alcohol use: Not Currently  . Drug use: Never  . Sexual activity: Not on file  Other Topics Concern  . Not on file  Social History Narrative   Lives alone   Caffeine use: 2 coffees per day   Right handed       Social History      Diet? Regular diet       Do you drink/eat things with caffeine? Yes coffee      Marital status?                widow                    What year were you married? [place] you live in a house, apartment, assisted living, condo, trailer, etc.? Apartment       Is it one or more stories? one      How many persons live in your home? one      Do you have any pets in your home? (please list) cockatiel       Highest level of education completed? 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    yes                                  Type & how often? Daily chair aerobics - water aerobics       Advanced Directives      Do you have a living will? No       Do you have a DNR form?            no                      If not, do you want to discuss one? yes      Do you have signed POA/HPOA for forms? No       Functional Status      Do you have difficulty bathing or dressing yourself? No       Do you have difficulty preparing food or eating? No       Do you have difficulty managing your medications? yes      Do you have difficulty managing your finances? Yes daughter manages       Do you have difficulty affording your medications? no      Social Determinants of Health   Financial Resource  Strain: Not on file  Food Insecurity: Not on file  Transportation Needs: Not on file  Physical Activity: Not on file  Stress: Not on file  Social Connections: Not on file  Intimate Partner Violence: Not on file      PHYSICAL EXAM  Vitals:   03/08/21 0957  BP: 131/71  Pulse: 73  Weight: 157 lb 6.4 oz (71.4 kg)  Height: 5\' 8"  (1.727 m)   Body mass index is 23.93 kg/m.   Generalized: Well developed, in no acute distress  Cardiology: normal rate and rhythm, no murmur auscultated  Respiratory: clear to auscultation bilaterally    Neurological examination  Mentation: Alert not oriented to time, place, or history taking. Repetitive in question asking. Follows some commands but needs continued guidance and instruction,  speech and language fluent Cranial nerve II-XII: Pupils were  equal round reactive to light. Extraocular movements were full, visual field were full on confrontational test. Facial sensation and strength were normal. Head turning and shoulder shrug  were normal and symmetric. Motor: The motor testing reveals 5 over 5 strength of all 4 extremities. Good symmetric motor tone is noted throughout.  Sensory: Sensory testing is intact to soft touch on all 4 extremities. No evidence of extinction is noted.  Coordination: Cerebellar testing reveals reduced (does not understand instructions) finger-nose-finger and good heel-to-shin bilaterally.  Gait and station: Gait is stable with cane    DIAGNOSTIC DATA (LABS, IMAGING, TESTING) - I reviewed patient records, labs, notes, testing and imaging myself where available.  Lab Results  Component Value Date   WBC 4.4 11/18/2019   HGB 13.3 11/18/2019   HCT 39.4 11/18/2019   MCV 95.2 11/18/2019   PLT 164 11/18/2019      Component Value Date/Time   NA 139 11/18/2019 0852   K 4.2 11/18/2019 0852   CL 105 11/18/2019 0852   CO2 25 11/18/2019 0852   GLUCOSE 98 11/18/2019 0852   BUN 9 11/18/2019 0852   CREATININE 0.87  11/18/2019 0852   CALCIUM 9.9 11/18/2019 0852   PROT 7.5 11/18/2019 0852   ALBUMIN 3.9 11/17/2009 1457   AST 15 11/18/2019 0852   ALT 8 11/18/2019 0852   ALKPHOS 77 11/17/2009 1457   BILITOT 0.5 11/18/2019 0852   GFRNONAA 66 11/18/2019 0852   GFRAA 76 11/18/2019 0852   Lab Results  Component Value Date   CHOL 197 11/18/2019   HDL 57 11/18/2019   LDLCALC 121 (H) 11/18/2019   TRIG 86 11/18/2019   CHOLHDL 3.5 11/18/2019   Lab Results  Component Value Date   HGBA1C 5.6 12/06/2018   Lab Results  Component Value Date   VITAMINB12 652 11/18/2018   Lab Results  Component Value Date   TSH 2.26 11/18/2019   MMSE - Mini Mental State Exam 03/08/2021 09/06/2020  Orientation to time 1 3  Orientation to Place 3 4  Registration 3 2  Attention/ Calculation 0 2  Recall 0 0  Language- name 2 objects 2 2  Language- repeat 1 1  Language- follow 3 step command 1 3  Language- read & follow direction 1 1  Write a sentence 1 1  Copy design 0 0  Total score 13 19    Montreal Cognitive Assessment  03/03/2020 09/03/2019 09/03/2019 11/18/2018  Visuospatial/ Executive (0/5) 3 1 1 2   Naming (0/3) 2 2 2 2   Attention: Read list of digits (0/2) 1 1 1 2   Attention: Read list of letters (0/1) 1 1 1 1   Attention: Serial 7 subtraction starting at 100 (0/3) 1 1 1 1   Language: Repeat phrase (0/2) 2 1 1 1   Language : Fluency (0/1) 1 0 0 0  Abstraction (0/2) 1 2 2 2   Delayed Recall (0/5) 0 0 0 0  Orientation (0/6) 4 4 4 5   Total 16 13 13 16   Adjusted Score (based on education) - - 61 17      ASSESSMENT AND PLAN  75 y.o. year old female  has a past medical history of ADHD, Arthritis, Cataracts, bilateral, Dementia ([place]), Depression, Glaucoma, Hypertension, and Polymyalgia rheumatica ([place]). here with   Late onset Alzheimer's disease without behavioral disturbance ([place]) - Plan: Ambulatory referral to Home Health  Depression, unspecified depression type  Hallucination  Difficulty  sleeping  Athziry is doing well, today. Memory loss continues to progress.  MMSE performed  today 13 of 30.  Previously 19/30. She has tolerated Aricept and Namenda. We will continue Arcipet 10mg  daily and Namenda 10mg  BID. She was advised on possible side effects and will call with any concerns. Daughter request referral to home health for assistance with ADL's. I feel she would benefit form PT/OT and ST due to impairments from moderate/sever AD. Will ask HH to evaluate. She will continue healthy lifestyle habits. Safety precautions discussed with daughter. She will follow up with me in 6 months. She and her daughter verbalize understanding and agreement with this plan.     Pam Presto, MSN, FNP-C 03/08/2021, 11:04 AM  [facility] [address], [place], [place] [address] [phone]

## 2021-03-03 NOTE — Patient Instructions (Addendum)
Below is our plan:  We will continue Aricept and Namenda. Continue close follow up with PCP. Consider Seroquel for help with sleep. I have included info on this medication below for your review. Consider calling [facility] to inquire about there Memory Program. We have placed a referral to Home health to see if she will qualify for any home services.   Please make sure you are staying well hydrated. I recommend 50-60 ounces daily. Well balanced diet and regular exercise encouraged. Consistent sleep schedule with 6-8 hours recommended.   Please continue follow up with care team as directed.   Follow up with me in 6-12 months   You may receive a survey regarding today's visit. I encourage you to leave honest feed back as I do use this information to improve patient care. Thank you for seeing me today!   Quetiapine tablets What is this medicine? QUETIAPINE (kwe TYE a peen) is an antipsychotic. It is used to treat schizophrenia and bipolar disorder, also known as manic-depression. This medicine may be used for other purposes; ask your health care provider or pharmacist if you have questions. COMMON BRAND NAME(S): Seroquel What should I tell my health care provider before I take this medicine? They need to know if you have any of these conditions:  blockage in your bowel  cataracts  constipation  dementia  diabetes  difficulty swallowing  glaucoma  heart disease  high levels of prolactin  history of breast cancer  history of irregular heartbeat  liver disease  low blood counts, like low white cell, platelet, or red cell counts  low blood pressure  Parkinson's disease  prostate disease  seizures  suicidal thoughts, plans or attempt; a previous suicide attempt by you or a family member  thyroid disease  trouble passing urine  an unusual or allergic reaction to quetiapine, other medicines, foods, dyes, or preservatives  pregnant or trying to get  pregnant  breast-feeding How should I use this medicine? Take this medicine by mouth. Swallow it with a drink of water. Follow the directions on the prescription label. If it upsets your stomach you can take it with food. Take your medicine at regular intervals. Do not take it more often than directed. Do not stop taking except on the advice of your doctor or health care professional. A special MedGuide will be given to you by the pharmacist with each prescription and refill. Be sure to read this information carefully each time. Talk to your pediatrician regarding the use of this medicine in children. While this drug may be prescribed for children as young as 10 years for selected conditions, precautions do apply. Patients over age 32 years may have a stronger reaction to this medicine and need smaller doses. Overdosage: If you think you have taken too much of this medicine contact a poison control center or emergency room at once. NOTE: This medicine is only for you. Do not share this medicine with others. What if I miss a dose? If you miss a dose, take it as soon as you can. If it is almost time for your next dose, take only that dose. Do not take double or extra doses. What may interact with this medicine? Do not take this medicine with any of the following medications:  cisapride  dronedarone  metoclopramide  pimozide  thioridazine This medicine may also interact with the following medications:  alcohol  antihistamines for allergy, cough, and cold  atropine  avasimibe  certain antivirals for HIV or hepatitis  certain medicines for anxiety or sleep  certain medicines for bladder problems like oxybutynin, tolterodine  certain medicines for depression like amitriptyline, fluoxetine, nefazodone, sertraline  certain medicines for fungal infections like fluconazole, ketoconazole, itraconazole, posaconazole  certain medicines for stomach problems like dicyclomine,  hyoscyamine  certain medicines for travel sickness like scopolamine  cimetidine  general anesthetics like halothane, isoflurane, methoxyflurane, propofol  ipratropium  levodopa or other medicines for Parkinson's disease  medicines for blood pressure  medicines for seizures  medicines that relax muscles for surgery  narcotic medicines for pain  other medicines that prolong the QT interval (cause an abnormal heart rhythm)  phenothiazines like chlorpromazine, prochlorperazine  rifampin  St. John's wort This list may not describe all possible interactions. Give your health care provider a list of all the medicines, herbs, non-prescription drugs, or dietary supplements you use. Also tell them if you smoke, drink alcohol, or use illegal drugs. Some items may interact with your medicine. What should I watch for while using this medicine? Visit your health care professional for regular checks on your progress. Tell your health care professional if symptoms do not start to get better or if they get worse. Do not stop taking except on your health care professional's advice. You may develop a severe reaction. Your health care professional will tell you how much medicine to take. You may need to have an eye exam before and during use of this medicine. This medicine may increase blood sugar. Ask your health care provider if changes in diet or medicines are needed if you have diabetes. Patients and their families should watch out for new or worsening depression or thoughts of suicide. Also watch out for sudden or severe changes in feelings such as feeling anxious, agitated, panicky, irritable, hostile, aggressive, impulsive, severely restless, overly excited and hyperactive, or not being able to sleep. If this happens, especially at the beginning of antidepressant treatment or after a change in dose, call your health care professional. Dennis Bast may get dizzy or drowsy. Do not drive, use machinery, or  do anything that needs mental alertness until you know how this medicine affects you. Do not stand or sit up quickly, especially if you are an older patient. This reduces the risk of dizzy or fainting spells. Alcohol may interfere with the effect of this medicine. Avoid alcoholic drinks. This drug can cause problems with controlling your body temperature. It can lower the response of your body to cold temperatures. If possible, stay indoors during cold weather. If you must go outdoors, wear warm clothes. It can also lower the response of your body to heat. Do not overheat. Do not over-exercise. Stay out of the sun when possible. If you must be in the sun, wear cool clothing. Drink plenty of water. If you have trouble controlling your body temperature, call your health care provider right away. What side effects may I notice from receiving this medicine? Side effects that you should report to your doctor or health care professional as soon as possible:  allergic reactions like skin rash, itching or hives, swelling of the face, lips, or tongue  breathing problems  changes in vision  confusion  elevated mood, decreased need for sleep, racing thoughts, impulsive behavior  eye pain  fast, irregular heartbeat  fever or chills, sore throat  inability to keep still  males: prolonged or painful erection  problems with balance, talking, walking  redness, blistering, peeling, or loosening of the skin, including inside the mouth  seizures  signs  and symptoms of high blood sugar such as being more thirsty or hungry or having to urinate more than normal. You may also feel very tired or have blurry vision  signs and symptoms of hypothyroidism like fatigue; increased sensitivity to cold; weight gain; hoarseness; thinning hair  signs and symptoms of low blood pressure like dizziness; feeling faint or lightheaded; falls; unusually weak or tired  signs and symptoms of neuroleptic malignant syndrome  like confusion; fast, irregular heartbeat; high fever; increased sweating; stiff muscles  sudden numbness or weakness of the face, arm, or leg  suicidal thoughts or other mood changes  trouble swallowing  uncontrollable movements of the arms, face, head, mouth, neck, or upper body Side effects that usually do not require medical attention (report to your doctor or health care professional if they continue or are bothersome):  change in sex drive or performance  constipation  drowsiness  dry mouth  upset stomach  weight gain This list may not describe all possible side effects. Call your doctor for medical advice about side effects. You may report side effects to FDA at 1-800-FDA-1088. Where should I keep my medicine? Keep out of the reach of children. Store at room temperature between 15 and 30 degrees C (59 and 86 degrees F). Throw away any unused medicine after the expiration date. NOTE: This sheet is a summary. It may not cover all possible information. If you have questions about this medicine, talk to your doctor, pharmacist, or health care provider.  2021 Elsevier/Gold Standard (2019-08-13 14:42:48)   Management of Memory Problems   There are some general things you can do to help manage your memory problems.  Your memory may not in fact recover, but by using techniques and strategies you will be able to manage your memory difficulties better.   1)  Establish a routine. ? Try to establish and then stick to a regular routine.  By doing this, you will get used to what to expect and you will reduce the need to rely on your memory.  Also, try to do things at the same time of day, such as taking your medication or checking your calendar first thing in the morning. ? Think about think that you can do as a part of a regular routine and make a list.  Then enter them into a daily planner to remind you.  This will help you establish a routine.   2)  Organize your  environment. ? Organize your environment so that it is uncluttered.  Decrease visual stimulation.  Place everyday items such as keys or cell phone in the same place every day (ie.  Basket next to front door) ? Use post it notes with a brief message to yourself (ie. Turn off light, lock the door) ? Use labels to indicate where things go (ie. Which cupboards are for food, dishes, etc.) ? Keep a notepad and pen by the telephone to take messages   3)  Memory Aids ? A diary or journal/notebook/daily planner ? Making a list (shopping list, chore list, to do list that needs to be done) ? Using an alarm as a reminder (kitchen timer or cell phone alarm) ? Using cell phone to store information (Notes, Calendar, Reminders) ? Calendar/White board placed in a prominent position ? Post-it notes   In order for memory aids to be useful, you need to have good habits.  It's no good remembering to make a note in your journal if you don't remember to look in  it.  Try setting aside a certain time of day to look in journal.   4)  Improving mood and managing fatigue. 1. There may be other factors that contribute to memory difficulties.  Factors, such as anxiety, depression and tiredness can affect memory.  Regular gentle exercise can help improve your mood and give you more energy.  Simple relaxation techniques may help relieve symptoms of anxiety  Try to get back to completing activities or hobbies you enjoyed doing in the past.  Learn to pace yourself through activities to decrease fatigue.  Find out about some local support groups where you can share experiences with others.  Try and achieve 7-8 hours of sleep at night.    Dementia Caregiver Guide Dementia is a term used to describe a number of symptoms that affect memory and thinking. The most common symptoms include:  Memory loss.  Trouble with language and communication.  Trouble concentrating.  Poor judgment and problems with  reasoning.  Wandering from home or public places.  Extreme anxiety or depression.  Being suspicious or having angry outbursts and accusations.  Child-like behavior and language. Dementia can be frightening and confusing. And taking care of someone with dementia can be challenging. This guide provides tips to help you when providing care for a person with dementia. How to help manage lifestyle changes Dementia usually gets worse slowly over time. In the early stages, people with dementia can stay independent and safe with some help. In later stages, they need help with daily tasks such as dressing, grooming, and using the bathroom. There are actions you can take to help a person manage his or her life while living with this condition. Communicating  When the person is talking or seems frustrated, make eye contact and hold the person's hand.  Ask specific questions that need yes or no answers.  Use simple words, short sentences, and a calm voice. Only give one direction at a time.  When offering choices, limit the person to just one or two.  Avoid correcting the person in a negative way.  If the person is struggling to find the right words, gently try to help him or her. Preventing injury  Keep floors clear of clutter. Remove rugs, magazine racks, and floor lamps.  Keep hallways well lit, especially at night.  Put a handrail and nonslip mat in the bathtub or shower.  Put childproof locks on cabinets that contain dangerous items, such as medicines, alcohol, guns, toxic cleaning items, sharp tools or utensils, matches, and lighters.  For doors to the outside of the house, put the locks in places where the person cannot see or reach them easily. This will help ensure that the person does not wander out of the house and get lost.  Be prepared for emergencies. Keep a list of emergency phone numbers and addresses in a convenient area.  Remove car keys and lock garage doors so that the  person does not try to get in the car and drive.  Have the person wear a bracelet that tracks locations and identifies the person as having memory problems. This should be worn at all times for safety.   Helping with daily life  Keep the person on track with his or her routine.  Try to identify areas where the person may need help.  Be supportive, patient, calm, and encouraging.  Gently remind the person that adjusting to changes takes time.  Help with the tasks that the person has asked for help with.  Keep the person involved in daily tasks and decisions as much as possible.  Encourage conversation, but try not to get frustrated if the person struggles to find words or does not seem to appreciate your help.   How to recognize stress Look for signs of stress in yourself and in the person you are caring for. If you notice signs of stress, take steps to manage it. Symptoms of stress include:  Feeling anxious, irritable, frustrated, or angry.  Denying that the person has dementia or that his or her symptoms will not improve.  Feeling depressed, hopeless, or unappreciated.  Difficulty sleeping.  Difficulty concentrating.  Developing stress-related health problems.  Feeling like you have too little time for your own life. Follow these instructions at home: Take care of your health Make sure that you and the person you are caring for:  Get regular sleep.  Exercise regularly.  Eat regular, nutritious meals.  Take over-the-counter and prescription medicines only as told by your health care providers.  Drink enough fluid to keep your urine pale yellow.  Attend all scheduled health care appointments.   General instructions  Join a support group with others who are caregivers.  Ask about respite care resources. Respite care can provide short-term care for the person so that you can have a regular break from the stress of caregiving.  Consider any safety risks and take  steps to avoid them.  Organize medicines in a pill box for each day of the week.  Create a plan to handle any legal or financial matters. Get legal or financial advice if needed.  Keep a calendar in a central location to remind the person of appointments or other activities. Where to find support: Many individuals and organizations offer support. These include:  Support groups for people with dementia.  Support groups for caregivers.  Counselors or therapists.  Home health care services.  Adult day care centers. Where to find more information  Centers for Disease Control and Prevention: http://www.wolf.info/  Alzheimer's Association: [employer].co.nz  Family Caregiver Alliance: www.caregiver.[place]: www.alzfdn.org Contact a health care provider if:  The person's health is rapidly getting worse.  You are no longer able to care for the person.  Caring for the person is affecting your physical and emotional health.  You are feeling depressed or anxious about caring for the person. Get help right away if:  The person threatens himself or herself, you, or anyone else.  You feel depressed or sad, or feel that you want to harm yourself. If you ever feel like your loved one may hurt himself or herself or others, or if he or she shares thoughts about taking his or her own life, get help right away. You can go to your nearest emergency department or:  Call your local emergency services (911 in the [place]  Call a suicide crisis helpline, such as the [facility] at 803-816-6557. This is open 24 hours a day in the [place]  Text the Crisis Text Line at 317-795-7909 (in the [place].). Summary  Dementia is a term used to describe a number of symptoms that affect memory and thinking.  Dementia usually gets worse slowly over time.  Take steps to reduce the person's risk of injury and to plan for future care.  Caregivers need support, relief from  caregiving, and time for their own lives. This information is not intended to replace advice given to you by your health care provider. Make sure you discuss  any questions you have with your health care provider. Document Revised: 02/09/2020 Document Reviewed: 02/09/2020 Elsevier Patient Education  [place].

## 2021-03-08 ENCOUNTER — Encounter: Payer: Self-pay | Admitting: Family Medicine

## 2021-03-08 ENCOUNTER — Ambulatory Visit: Payer: PPO | Admitting: Family Medicine

## 2021-03-08 VITALS — BP 131/71 | HR 73 | Ht 68.0 in | Wt 157.4 lb

## 2021-03-08 DIAGNOSIS — F0280 Dementia in other diseases classified elsewhere without behavioral disturbance: Secondary | ICD-10-CM | POA: Diagnosis not present

## 2021-03-08 DIAGNOSIS — F32A Depression, unspecified: Secondary | ICD-10-CM | POA: Diagnosis not present

## 2021-03-08 DIAGNOSIS — G301 Alzheimer's disease with late onset: Secondary | ICD-10-CM | POA: Diagnosis not present

## 2021-03-08 DIAGNOSIS — R443 Hallucinations, unspecified: Secondary | ICD-10-CM | POA: Diagnosis not present

## 2021-03-08 DIAGNOSIS — G479 Sleep disorder, unspecified: Secondary | ICD-10-CM

## 2021-03-08 NOTE — Progress Notes (Signed)
I have read the note, and I agree with the clinical assessment and plan.  Boluwatife Mutchler A. Tariyah Pendry, MD, PhD, FAAN Certified in Neurology, Clinical Neurophysiology, Sleep Medicine, Pain Medicine and Neuroimaging  [facility] [address], [address] [place], [place] [address] [phone]  

## 2021-03-17 ENCOUNTER — Other Ambulatory Visit: Payer: Self-pay | Admitting: Family

## 2021-03-17 DIAGNOSIS — F331 Major depressive disorder, recurrent, moderate: Secondary | ICD-10-CM

## 2021-03-19 ENCOUNTER — Other Ambulatory Visit: Payer: Self-pay | Admitting: Adult Health

## 2021-03-19 DIAGNOSIS — M25552 Pain in left hip: Secondary | ICD-10-CM

## 2021-03-19 DIAGNOSIS — M542 Cervicalgia: Secondary | ICD-10-CM

## 2021-03-21 ENCOUNTER — Ambulatory Visit (INDEPENDENT_AMBULATORY_CARE_PROVIDER_SITE_OTHER): Payer: PPO | Admitting: Family

## 2021-03-21 ENCOUNTER — Encounter: Payer: Self-pay | Admitting: Family

## 2021-03-21 ENCOUNTER — Other Ambulatory Visit: Payer: Self-pay

## 2021-03-21 VITALS — BP 100/80 | HR 51 | Temp 97.3°F | Resp 18 | Ht 68.0 in | Wt 158.2 lb

## 2021-03-21 DIAGNOSIS — M25552 Pain in left hip: Secondary | ICD-10-CM | POA: Diagnosis not present

## 2021-03-21 DIAGNOSIS — F331 Major depressive disorder, recurrent, moderate: Secondary | ICD-10-CM | POA: Diagnosis not present

## 2021-03-21 DIAGNOSIS — F0280 Dementia in other diseases classified elsewhere without behavioral disturbance: Secondary | ICD-10-CM | POA: Diagnosis not present

## 2021-03-21 DIAGNOSIS — G8929 Other chronic pain: Secondary | ICD-10-CM

## 2021-03-21 DIAGNOSIS — I10 Essential (primary) hypertension: Secondary | ICD-10-CM | POA: Diagnosis not present

## 2021-03-21 DIAGNOSIS — R2681 Unsteadiness on feet: Secondary | ICD-10-CM

## 2021-03-21 DIAGNOSIS — M542 Cervicalgia: Secondary | ICD-10-CM

## 2021-03-21 DIAGNOSIS — G301 Alzheimer's disease with late onset: Secondary | ICD-10-CM | POA: Diagnosis not present

## 2021-03-21 NOTE — Progress Notes (Signed)
Provider: [name]  Lakeyshia Tuckerman, [name], NP  Patient Care Team: Kailyn Dubie, [name], NP as PCP - General (Family Medicine) Saporito, Maree Erie, LCSW as [place] Management [name], MD as Consulting Physician (Ophthalmology) Felecia Shelling, Nanine Means, MD (Neurology)  Extended Emergency Contact Information Primary Emergency Contact: [name] Address: [address] [place] [address] [name] Work Phone: [phone] Mobile Phone: [phone] Relation: Daughter Secondary Emergency Contact: [name],[name]  [place] Phone: [phone] Relation: Grandson  Code Status:  Full Code  Goals of care: Advanced Directive information Advanced Directives 03/21/2021  Does Patient Have a Medical Advance Directive? Yes  Type of Paramedic of [place];Living will  Does patient want to make changes to medical advance directive? No - Patient declined  Copy of [place] in Chart? -  Would patient like information on creating a medical advance directive? -     Chief Complaint  Patient presents with   Acute Visit    Patient request for medical form to be filled out.    HPI:  Pt is a 75 y.o. female seen today for an acute visit for completion of medical records.she is here today with her grandson.she denies any acute issues.grandson states sometimes patient tries to get out of the door to sit outside but denies any wondering behaviors. States left hip pain under controlled.does not use her cane while in the house but carries her cane when going out.No fall episode reported.  Appetite has been good.  She would like paper work to be completed to be able to attend an adult day care program.   Past Medical History:  Diagnosis Date   ADHD    Arthritis    Cataracts, bilateral    Dementia ([place])    Depression    Glaucoma    Hypertension    Polymyalgia rheumatica ([place])     Past Surgical History:  Procedure Laterality Date   PARTIAL HIP ARTHROPLASTY Right 2011   REPLACEMENT TOTAL KNEE Left 2013   Dr Jodell Cipro    No Known Allergies  Outpatient Encounter Medications as of 03/21/2021  Medication Sig   amLODipine (NORVASC) 10 MG tablet TAKE 1 TABLET BY MOUTH EVERY DAY   aspirin 325 MG tablet Take 325 mg by mouth as needed.    brimonidine-timolol (COMBIGAN) 0.2-0.5 % ophthalmic solution Place 1 drop into both eyes every 12 (twelve) hours.   Bromfenac Sodium (PROLENSA) 0.07 % SOLN Apply 1 drop to eye daily at 12 noon. Right Eye.   buPROPion (WELLBUTRIN XL) 300 MG 24 hr tablet TAKE 1 TABLET BY MOUTH EVERY DAY   Cholecalciferol (VITAMIN D) 125 MCG (5000 UT) CAPS Take 1 capsule by mouth daily.   Cranberry-Cholecalciferol 4200-500 MG-UNIT CAPS Take 1 capsule by mouth daily.   diclofenac Sodium (VOLTAREN) 1 % GEL Apply 2 g topically 4 (four) times daily.   donepezil (ARICEPT) 10 MG tablet Take one tablet daily at bedtime   doxazosin (CARDURA) 4 MG tablet TAKE 1 TABLET BY MOUTH EVERY DAY   fexofenadine (ALLEGRA) 180 MG tablet Take 180 mg by mouth daily.   gabapentin (NEURONTIN) 100 MG capsule Take 1 capsule (100 mg total) by mouth at bedtime.   Latanoprostene Bunod (VYZULTA) 0.024 % SOLN Apply to eye daily.   Loperamide HCl (IMODIUM PO) Take by mouth at bedtime.   meloxicam (MOBIC) 7.5 MG tablet TAKE 1 TABLET BY  MOUTH EVERY DAY   memantine (NAMENDA) 10 MG tablet Take 1 tablet (10 mg total) by mouth 2 (two) times daily.   Menthol-Methyl Salicylate (SALONPAS PAIN RELIEF PATCH) PTCH Apply 1 application topically daily.   ofloxacin (OCUFLOX) 0.3 % ophthalmic solution Place 1 drop into the right eye 4 (four) times daily.   Omega-3 Fatty Acids (OMEGA 3 PO) Take 750 mg by mouth daily.   spironolactone (ALDACTONE) 25 MG tablet TAKE 1 TABLET BY MOUTH TWICE A DAY   Turmeric (QC TUMERIC COMPLEX PO) Take 15 mLs by mouth daily.   vitamin C (ASCORBIC ACID) 500 MG tablet Take  500 mg by mouth daily.   XIIDRA 5 % SOLN Place 1 drop into the right eye 2 (two) times daily.   No facility-administered encounter medications on file as of 03/21/2021.    Review of Systems  Constitutional:  Negative for appetite change, chills, fatigue, fever and unexpected weight change.  HENT:  Negative for congestion, dental problem, ear discharge, ear pain, facial swelling, hearing loss, nosebleeds, postnasal drip, rhinorrhea, sinus pressure, sinus pain, sneezing, sore throat, tinnitus and trouble swallowing.   Eyes:  Negative for pain, discharge, redness, itching and visual disturbance.  Respiratory:  Negative for cough, chest tightness, shortness of breath and wheezing.   Cardiovascular:  Negative for chest pain, palpitations and leg swelling.  Gastrointestinal:  Negative for abdominal distention, abdominal pain, blood in stool, constipation, diarrhea, nausea and vomiting.  Endocrine: Negative for cold intolerance, heat intolerance, polydipsia, polyphagia and polyuria.  Genitourinary:  Negative for difficulty urinating, dysuria, flank pain, frequency and urgency.  Musculoskeletal:  Positive for arthralgias and gait problem. Negative for back pain, joint swelling, myalgias, neck pain and neck stiffness.       Left hip pain under controlled.  Skin:  Negative for color change, pallor, rash and wound.  Neurological:  Negative for dizziness, syncope, speech difficulty, weakness, light-headedness, numbness and headaches.  Hematological:  Does not bruise/bleed easily.  Psychiatric/Behavioral:  Negative for agitation, behavioral problems, confusion, hallucinations, self-injury, sleep disturbance and suicidal ideas. The patient is not nervous/anxious.        Forgetful    Immunization History  Administered Date(s) Administered   Influenza, High Dose Seasonal PF 07/29/2017, 08/30/2018, 08/14/2019   Influenza-Unspecified 08/12/2020   PFIZER(Purple Top)SARS-COV-2 Vaccination 11/20/2019,  12/15/2019, 09/09/2020   Pneumococcal Conjugate-13 09/09/2019   Pneumococcal Polysaccharide-23 11/22/2016   Zoster Recombinat (Shingrix) 10/22/2018   Pertinent  Health Maintenance Due  Topic Date Due   DEXA SCAN  Never done   INFLUENZA VACCINE  05/09/2021   COLONOSCOPY (Pts 45-100yrs Insurance coverage will need to be confirmed)  10/26/2029   PNA vac Low Risk Adult  Completed   Fall Risk  03/21/2021 01/27/2021 11/19/2020 10/19/2020 12/22/2019  Falls in the past year? 0 0 0 0 0  Number falls in past yr: 0 0 0 0 0  Injury with Fall? 0 0 0 - 0  Risk for fall due to : - - - - -  Follow up - - - - -   Functional Status Survey:    Vitals:   03/21/21 1049  BP: 100/80  Pulse: (!) 51  Resp: 18  Temp: (!) 97.3 F (36.3 C)  SpO2: 96%  Weight: 158 lb 3.2 oz (71.8 kg)  Height: 5\' 8"  (1.727 m)   Body mass index is 24.05 kg/m. Physical Exam Vitals reviewed.  Constitutional:      General: She is not in acute distress.    Appearance: Normal appearance.  She is normal weight. She is not ill-appearing or diaphoretic.  HENT:     Head: Normocephalic.     Nose: Nose normal. No congestion or rhinorrhea.     Mouth/Throat:     Mouth: Mucous membranes are moist.     Pharynx: Oropharynx is clear. No oropharyngeal exudate or posterior oropharyngeal erythema.  Eyes:     General: No scleral icterus.       Right eye: No discharge.        Left eye: No discharge.     Extraocular Movements: Extraocular movements intact.     Conjunctiva/sclera: Conjunctivae normal.     Pupils: Pupils are equal, round, and reactive to light.  Neck:     Vascular: No carotid bruit.  Cardiovascular:     Rate and Rhythm: Normal rate and regular rhythm.     Pulses: Normal pulses.     Heart sounds: Normal heart sounds. No murmur heard.   No friction rub. No gallop.  Pulmonary:     Effort: Pulmonary effort is normal. No respiratory distress.     Breath sounds: Normal breath sounds. No wheezing, rhonchi or rales.  Chest:      Chest wall: No tenderness.  Abdominal:     General: Bowel sounds are normal. There is no distension.     Palpations: Abdomen is soft. There is no mass.     Tenderness: There is no abdominal tenderness. There is no right CVA tenderness, left CVA tenderness, guarding or rebound.  Musculoskeletal:        General: No swelling or tenderness.     Cervical back: Normal range of motion. No rigidity or tenderness.     Right lower leg: No edema.     Left lower leg: No edema.     Comments: Unsteady gait   Lymphadenopathy:     Cervical: No cervical adenopathy.  Skin:    General: Skin is warm and dry.     Coloration: Skin is not pale.     Findings: No bruising, erythema, lesion or rash.     Comments: Right knee old surgical knee replacement incision healed.   Neurological:     Mental Status: She is alert and oriented to person, place, and time.     Cranial Nerves: No cranial nerve deficit.     Sensory: No sensory deficit.     Motor: No weakness.     Coordination: Coordination normal.     Gait: Gait normal.  Psychiatric:        Mood and Affect: Mood normal.        Speech: Speech normal.        Behavior: Behavior normal.        Thought Content: Thought content normal.        Cognition and Memory: Memory is impaired.    Labs reviewed: No results for input(s): NA, K, CL, CO2, GLUCOSE, BUN, CREATININE, CALCIUM, MG, PHOS in the last 8760 hours. No results for input(s): AST, ALT, ALKPHOS, BILITOT, PROT, ALBUMIN in the last 8760 hours. No results for input(s): WBC, NEUTROABS, HGB, HCT, MCV, PLT in the last 8760 hours. Lab Results  Component Value Date   TSH 2.26 11/18/2019   Lab Results  Component Value Date   HGBA1C 5.6 12/06/2018   Lab Results  Component Value Date   CHOL 197 11/18/2019   HDL 57 11/18/2019   LDLCALC 121 (H) 11/18/2019   TRIG 86 11/18/2019   CHOLHDL 3.5 11/18/2019    Significant Diagnostic Results in  last 30 days:  No results found.  Assessment/Plan 1.  Essential hypertension B/p well controlled.continue current medication. - Advised to check Blood pressure at home and record on log provided and notify provider if B/p > 140/90    2. Late onset Alzheimer's disease without behavioral disturbance ([place]) No new behavioral issues.will benefit from activities at the Day care program.  3. Hip pain, chronic, left Pain well controlled.continue on Gabapentin for pain.  Fall and safety precaution advised   4. Chronic neck pain Continue current pain regimen.   5. Unsteady gait Advised to use her cane fall and safety precaution.  6. Moderate episode of recurrent major depressive disorder (HCC) Mood stable.continue on Bupropion.   Family/ staff Communication: Reviewed plan of care with patient and Grandson verbalized understanding.   Labs/tests ordered: None   Next Appointment: Has appointment 05/20/2021   Sandrea Hughs, NP

## 2021-04-05 ENCOUNTER — Other Ambulatory Visit: Payer: Self-pay | Admitting: Family

## 2021-04-26 ENCOUNTER — Other Ambulatory Visit: Payer: Self-pay

## 2021-04-26 ENCOUNTER — Encounter: Payer: Self-pay | Admitting: Family

## 2021-04-26 ENCOUNTER — Ambulatory Visit (INDEPENDENT_AMBULATORY_CARE_PROVIDER_SITE_OTHER): Payer: PPO | Admitting: Family

## 2021-04-26 VITALS — BP 160/80 | HR 59 | Temp 97.1°F | Resp 16 | Ht 68.0 in | Wt 160.6 lb

## 2021-04-26 DIAGNOSIS — G629 Polyneuropathy, unspecified: Secondary | ICD-10-CM | POA: Diagnosis not present

## 2021-04-26 DIAGNOSIS — R001 Bradycardia, unspecified: Secondary | ICD-10-CM

## 2021-04-26 DIAGNOSIS — R42 Dizziness and giddiness: Secondary | ICD-10-CM

## 2021-04-26 MED ORDER — DONEPEZIL HCL 10 MG PO TABS: ORAL_TABLET | ORAL | 0 refills | Status: DC

## 2021-04-26 NOTE — Progress Notes (Signed)
Provider: [name]  Mackenzy Eisenberg, [name], NP  Patient Care Team: [place] Dolinger, [name], NP as PCP - General (Family Medicine) Saporito, Maree Erie, LCSW as [place] Management [name], MD as Consulting Physician (Ophthalmology) Felecia Shelling, Nanine Means, MD (Neurology)  Extended Emergency Contact Information Primary Emergency Contact: [name] Address: [address] [place] [address] [name] Work Phone: [phone] Mobile Phone: [phone] Relation: Daughter Secondary Emergency Contact: [name],[name]  [place] Phone: [phone] Relation: Grandson  Code Status: Full Code  Goals of care: Advanced Directive information Advanced Directives 04/26/2021  Does Patient Have a Medical Advance Directive? Yes  Type of Paramedic of [place];Living will  Does patient want to make changes to medical advance directive? No - Patient declined  Copy of [place] in Chart? Yes - validated most recent copy scanned in chart (See row information)  Would patient like information on creating a medical advance directive? -     Chief Complaint  Patient presents with   Acute Visit    Patient complains of feeling dizzy and lightheadedness.     HPI:  Pt is a 75 y.o. female seen today for an acute visit for evaluation of dizziness and lightheadedness that happed on Saturday while walking with her Friend.usually exercise with her neighbors walking but had to lean on her on Saturday.Left leg also gave out  had some numbness and tingling on left leg.Has had no dizziness since then. She denies any fatigue,chest tightness,palpitation,chest pain or shortness of breath. Daughter states going to day care program has helped with depression issues.     Past Medical History:  Diagnosis Date   ADHD    Arthritis    Cataracts, bilateral    Dementia ([place])    Depression    Glaucoma     Hypertension    Polymyalgia rheumatica ([place])    Past Surgical History:  Procedure Laterality Date   PARTIAL HIP ARTHROPLASTY Right 2011   REPLACEMENT TOTAL KNEE Left 2013   Dr Jodell Cipro    No Known Allergies  Outpatient Encounter Medications as of 04/26/2021  Medication Sig   amLODipine (NORVASC) 10 MG tablet TAKE 1 TABLET BY MOUTH EVERY DAY   aspirin 325 MG tablet Take 325 mg by mouth as needed.    brimonidine-timolol (COMBIGAN) 0.2-0.5 % ophthalmic solution Place 1 drop into both eyes every 12 (twelve) hours.   buPROPion (WELLBUTRIN XL) 300 MG 24 hr tablet TAKE 1 TABLET BY MOUTH EVERY DAY   Cholecalciferol (VITAMIN D) 125 MCG (5000 UT) CAPS Take 1 capsule by mouth daily.   Cranberry-Cholecalciferol 4200-500 MG-UNIT CAPS Take 1 capsule by mouth daily.   diclofenac Sodium (VOLTAREN) 1 % GEL Apply 2 g topically 4 (four) times daily.   diphenhydrAMINE (BENADRYL ALLERGY) 25 MG tablet Take 25 mg by mouth every evening.   donepezil (ARICEPT) 10 MG tablet Take one tablet daily at bedtime   doxazosin (CARDURA) 4 MG tablet TAKE 1 TABLET BY MOUTH EVERY DAY   gabapentin (NEURONTIN) 100 MG capsule Take 1 capsule (100 mg total) by mouth at bedtime.   Latanoprostene Bunod (VYZULTA) 0.024 % SOLN Apply to eye daily.   Loperamide HCl (IMODIUM PO) Take by mouth at bedtime.   meloxicam (MOBIC) 7.5 MG tablet Take 1 tablet (7.5 mg total) by mouth daily.   memantine (NAMENDA) 10 MG tablet Take 1 tablet (10 mg total) by  mouth 2 (two) times daily.   Menthol-Methyl Salicylate (SALONPAS PAIN RELIEF PATCH) PTCH Apply 1 application topically daily.   ofloxacin (OCUFLOX) 0.3 % ophthalmic solution Place 1 drop into the right eye 4 (four) times daily.   Omega-3 Fatty Acids (OMEGA 3 PO) Take 750 mg by mouth daily.   spironolactone (ALDACTONE) 25 MG tablet TAKE 1 TABLET BY MOUTH TWICE A DAY   Turmeric (QC TUMERIC COMPLEX PO) Take 15 mLs by mouth daily.   vitamin C (ASCORBIC ACID) 500 MG tablet Take 500 mg by  mouth daily.   XIIDRA 5 % SOLN Place 1 drop into the right eye 2 (two) times daily.   [DISCONTINUED] Bromfenac Sodium (PROLENSA) 0.07 % SOLN Apply 1 drop to eye daily at 12 noon. Right Eye.   [DISCONTINUED] fexofenadine (ALLEGRA) 180 MG tablet Take 180 mg by mouth daily.   No facility-administered encounter medications on file as of 04/26/2021.    Review of Systems  Constitutional:  Negative for appetite change, chills, fatigue, fever and unexpected weight change.  HENT:  Negative for congestion, dental problem, ear discharge, ear pain, facial swelling, hearing loss, nosebleeds, postnasal drip, rhinorrhea, sinus pressure, sinus pain, sneezing, sore throat, tinnitus and trouble swallowing.   Eyes:  Negative for pain, discharge, redness, itching and visual disturbance.  Respiratory:  Negative for cough, chest tightness, shortness of breath and wheezing.   Cardiovascular:  Negative for chest pain, palpitations and leg swelling.  Gastrointestinal:  Negative for abdominal distention, abdominal pain, blood in stool, constipation, diarrhea, nausea and vomiting.  Endocrine: Negative for cold intolerance, heat intolerance, polydipsia, polyphagia and polyuria.  Genitourinary:  Negative for difficulty urinating, dysuria, flank pain, frequency and urgency.  Musculoskeletal:  Negative for arthralgias, back pain, gait problem, joint swelling, myalgias, neck pain and neck stiffness.  Skin:  Negative for color change, pallor, rash and wound.  Neurological:  Positive for dizziness, light-headedness and numbness. Negative for syncope, speech difficulty, weakness and headaches.       Left leg numbness   Hematological:  Does not bruise/bleed easily.  Psychiatric/Behavioral:  Negative for agitation, behavioral problems, confusion, hallucinations, self-injury, sleep disturbance and suicidal ideas. The patient is not nervous/anxious.    Immunization History  Administered Date(s) Administered   Influenza, High Dose  Seasonal PF 07/29/2017, 08/30/2018, 08/14/2019   Influenza-Unspecified 08/12/2020   PFIZER(Purple Top)SARS-COV-2 Vaccination 11/20/2019, 12/15/2019, 09/09/2020   Pneumococcal Conjugate-13 09/09/2019   Pneumococcal Polysaccharide-23 11/22/2016   Zoster Recombinat (Shingrix) 10/22/2018   Pertinent  Health Maintenance Due  Topic Date Due   DEXA SCAN  Never done   INFLUENZA VACCINE  05/09/2021   COLONOSCOPY (Pts 45-50yrs Insurance coverage will need to be confirmed)  10/26/2029   PNA vac Low Risk Adult  Completed   Fall Risk  04/26/2021 03/21/2021 01/27/2021 11/19/2020 10/19/2020  Falls in the past year? 1 0 0 0 0  Number falls in past yr: 1 0 0 0 0  Injury with Fall? 1 0 0 0 -  Risk for fall due to : History of fall(s) - - - -  Follow up Falls evaluation completed - - - -   Functional Status Survey:    Vitals:   04/26/21 1451  BP: (!) 160/80  Pulse: (!) 59  Resp: 16  Temp: (!) 97.1 F (36.2 C)  SpO2: 98%  Weight: 160 lb 9.6 oz (72.8 kg)  Height: 5\' 8"  (1.727 m)   Body mass index is 24.42 kg/m. Physical Exam Vitals reviewed.  Constitutional:  General: She is not in acute distress.    Appearance: Normal appearance. She is normal weight. She is not ill-appearing or diaphoretic.  HENT:     Head: Normocephalic.     Right Ear: Tympanic membrane, ear canal and external ear normal. There is no impacted cerumen.     Left Ear: Tympanic membrane, ear canal and external ear normal. There is no impacted cerumen.     Nose: Nose normal. No congestion or rhinorrhea.     Mouth/Throat:     Mouth: Mucous membranes are moist.     Pharynx: Oropharynx is clear. No oropharyngeal exudate or posterior oropharyngeal erythema.  Eyes:     General: No scleral icterus.       Right eye: No discharge.        Left eye: No discharge.     Extraocular Movements: Extraocular movements intact.     Conjunctiva/sclera: Conjunctivae normal.     Pupils: Pupils are equal, round, and reactive to light.   Neck:     Vascular: No carotid bruit.  Cardiovascular:     Rate and Rhythm: Normal rate and regular rhythm.     Pulses: Normal pulses.     Heart sounds: Normal heart sounds. No murmur heard.   No friction rub. No gallop.  Pulmonary:     Effort: Pulmonary effort is normal. No respiratory distress.     Breath sounds: Normal breath sounds. No wheezing, rhonchi or rales.  Chest:     Chest wall: No tenderness.  Abdominal:     General: Bowel sounds are normal. There is no distension.     Palpations: Abdomen is soft. There is no mass.     Tenderness: There is no abdominal tenderness. There is no right CVA tenderness, left CVA tenderness, guarding or rebound.  Musculoskeletal:        General: No swelling or tenderness. Normal range of motion.     Cervical back: Normal range of motion. No rigidity or tenderness.     Right lower leg: No edema.     Left lower leg: No edema.  Lymphadenopathy:     Cervical: No cervical adenopathy.  Skin:    General: Skin is warm and dry.     Coloration: Skin is not pale.     Findings: No bruising, erythema, lesion or rash.  Neurological:     Mental Status: She is alert and oriented to person, place, and time.     Cranial Nerves: No cranial nerve deficit.     Motor: No weakness.     Coordination: Coordination normal.     Gait: Gait normal.     Comments: Decreased monofilament sensation to left foot   Psychiatric:        Mood and Affect: Mood normal.        Speech: Speech normal.        Behavior: Behavior normal.        Thought Content: Thought content normal.        Cognition and Memory: Memory is impaired.    Labs reviewed: No results for input(s): NA, K, CL, CO2, GLUCOSE, BUN, CREATININE, CALCIUM, MG, PHOS in the last 8760 hours. No results for input(s): AST, ALT, ALKPHOS, BILITOT, PROT, ALBUMIN in the last 8760 hours. No results for input(s): WBC, NEUTROABS, HGB, HCT, MCV, PLT in the last 8760 hours. Lab Results  Component Value Date   TSH 2.26  11/18/2019   Lab Results  Component Value Date   HGBA1C 5.6 12/06/2018   Lab  Results  Component Value Date   CHOL 197 11/18/2019   HDL 57 11/18/2019   LDLCALC 121 (H) 11/18/2019   TRIG 86 11/18/2019   CHOLHDL 3.5 11/18/2019    Significant Diagnostic Results in last 30 days:  No results found.  Assessment/Plan 1. Dizziness Possible related to her high blood pressure.Daughter will check Blood pressure at home then compare with office reading. - EKG 12-Lead indicate sinus bradycardia HR 57 b/min no previous EKG for comparison.Possible Aricept side effects.Will wean off Aricept then monitor  - donepezil (ARICEPT) 10 MG tablet; Decrease from 10 mg tablet to 5 mg tablet daily x 1 week then stop.  Dispense: 90 tablet; Refill: 0  2. Bradycardia - EKG 12-Lead indicate sinus bradycardia HR 57 b/min no previous EKG for comparison.Possible Aricept side effects.Will wean off Aricept then monitor  - donepezil (ARICEPT) 10 MG tablet; Decrease from 10 mg tablet to 5 mg tablet daily x 1 week then stop.  Dispense: 90 tablet; Refill: 0  3. Neuropathy Left leg gave out while she was exercising as usual with her Friend.Had to be supported back home.dizziness and lightheadedness could have also contributed.will refer to Neuro for further evaluation of numbness on left leg has decreased monofilament sensation on exam.  - Ambulatory referral to Neurology  Family/ staff Communication: Reviewed plan of care with patient and daughter verbalized understanding.   Labs/tests ordered: - EKG 12-Lead  Next Appointment: Has appointment 05/20/2021   Sandrea Hughs, NP

## 2021-04-26 NOTE — Patient Instructions (Signed)
Decrease Aricept from 10 mg tablet to 5 mg tablet daily x 1 week then stop.  Donepezil tablets What is this medication? DONEPEZIL (doe NEP e zil) is used to treat mild to moderate dementia caused byAlzheimer's disease. This medicine may be used for other purposes; ask your health care provider orpharmacist if you have questions. COMMON BRAND NAME(S): Aricept What should I tell my care team before I take this medication? They need to know if you have any of these conditions: asthma or other lung disease difficulty passing urine head injury heart disease history of irregular heartbeat liver disease seizures (convulsions) stomach or intestinal disease, ulcers or stomach bleeding an unusual or allergic reaction to donepezil, other medicines, foods, dyes, or preservatives pregnant or trying to get pregnant breast-feeding How should I use this medication? Take this medicine by mouth with a glass of water. Follow the directions on the prescription label. You may take this medicine with or without food. Take this medicine at regular intervals. This medicine is usually taken before bedtime. Do not take it more often than directed. Continue to take your medicine even ifyou feel better. Do not stop taking except on your doctor's advice. If you are taking the 23 mg donepezil tablet, swallow it whole; do not cut,crush, or chew it. Talk to your pediatrician regarding the use of this medicine in children.Special care may be needed. Overdosage: If you think you have taken too much of this medicine contact apoison control center or emergency room at once. NOTE: This medicine is only for you. Do not share this medicine with others. What if I miss a dose? If you miss a dose, take it as soon as you can. If it is almost time for yournext dose, take only that dose, do not take double or extra doses. What may interact with this medication? Do not take this medicine with any of the following  medications: certain medicines for fungal infections like itraconazole, fluconazole, posaconazole, and voriconazole cisapride dextromethorphan; quinidine dronedarone pimozide quinidine thioridazine This medicine may also interact with the following medications: antihistamines for allergy, cough and cold atropine bethanechol carbamazepine certain medicines for bladder problems like oxybutynin, tolterodine certain medicines for Parkinson's disease like benztropine, trihexyphenidyl certain medicines for stomach problems like dicyclomine, hyoscyamine certain medicines for travel sickness like scopolamine dexamethasone dofetilide ipratropium NSAIDs, medicines for pain and inflammation, like ibuprofen or naproxen other medicines for Alzheimer's disease other medicines that prolong the QT interval (cause an abnormal heart rhythm) phenobarbital phenytoin rifampin, rifabutin or rifapentine ziprasidone This list may not describe all possible interactions. Give your health care provider a list of all the medicines, herbs, non-prescription drugs, or dietary supplements you use. Also tell them if you smoke, drink alcohol, or use illegaldrugs. Some items may interact with your medicine. What should I watch for while using this medication? Visit your doctor or health care professional for regular checks on your progress. Check with your doctor or health care professional if your symptomsdo not get better or if they get worse. You may get drowsy or dizzy. Do not drive, use machinery, or do anything thatneeds mental alertness until you know how this drug affects you. What side effects may I notice from receiving this medication? Side effects that you should report to your doctor or health care professionalas soon as possible: allergic reactions like skin rash, itching or hives, swelling of the face, lips, or tongue feeling faint or lightheaded, falls loss of bladder control seizures signs and  symptoms of a dangerous  change in heartbeat or heart rhythm like chest pain; dizziness; fast or irregular heartbeat; palpitations; feeling faint or lightheaded, falls; breathing problems signs and symptoms of infection like fever or chills; cough; sore throat; pain or trouble passing urine signs and symptoms of liver injury like dark yellow or brown urine; general ill feeling or flu-like symptoms; light-colored stools; loss of appetite; nausea; right upper belly pain; unusually weak or tired; yellowing of the eyes or skin slow heartbeat or palpitations unusual bleeding or bruising vomiting Side effects that usually do not require medical attention (report to yourdoctor or health care professional if they continue or are bothersome): diarrhea, especially when starting treatment headache loss of appetite muscle cramps nausea stomach upset This list may not describe all possible side effects. Call your doctor for medical advice about side effects. You may report side effects to FDA at1-800-FDA-1088. Where should I keep my medication? Keep out of reach of children. Store at room temperature between 15 and 30 degrees C (59 and 86 degrees F).Throw away any unused medicine after the expiration date. NOTE: This sheet is a summary. It may not cover all possible information. If you have questions about this medicine, talk to your doctor, pharmacist, orhealth care provider.  2022 Elsevier/Gold Standard (2018-09-16 10:33:41)

## 2021-04-28 ENCOUNTER — Other Ambulatory Visit: Payer: Self-pay | Admitting: Family

## 2021-04-28 DIAGNOSIS — I10 Essential (primary) hypertension: Secondary | ICD-10-CM

## 2021-05-17 ENCOUNTER — Other Ambulatory Visit: Payer: PPO

## 2021-05-17 DIAGNOSIS — I10 Essential (primary) hypertension: Secondary | ICD-10-CM

## 2021-05-19 ENCOUNTER — Encounter: Payer: Self-pay | Admitting: Family

## 2021-05-20 ENCOUNTER — Encounter: Payer: PPO | Admitting: Family

## 2021-05-22 NOTE — Progress Notes (Signed)
  This encounter was created in error - please disregard. No show 

## 2021-06-08 ENCOUNTER — Telehealth: Payer: Self-pay | Admitting: Family

## 2021-06-08 NOTE — Telephone Encounter (Signed)
I called the patient and left a vm for her to call back and sched an awv and a 6 Month f/u

## 2021-06-18 ENCOUNTER — Other Ambulatory Visit: Payer: Self-pay | Admitting: Family

## 2021-07-20 ENCOUNTER — Other Ambulatory Visit: Payer: Self-pay

## 2021-07-20 ENCOUNTER — Ambulatory Visit
Admission: EM | Admit: 2021-07-20 | Discharge: 2021-07-20 | Disposition: A | Discharge status: Home / Self Care | Payer: PPO | Attending: Internal Medicine | Admitting: Internal Medicine

## 2021-07-20 ENCOUNTER — Ambulatory Visit: Payer: PPO | Admitting: Adult Health

## 2021-07-20 DIAGNOSIS — H1131 Conjunctival hemorrhage, right eye: Secondary | ICD-10-CM

## 2021-07-20 DIAGNOSIS — H26491 Other secondary cataract, right eye: Secondary | ICD-10-CM | POA: Diagnosis not present

## 2021-07-20 DIAGNOSIS — H401130 Primary open-angle glaucoma, bilateral, stage unspecified: Secondary | ICD-10-CM | POA: Diagnosis not present

## 2021-07-20 NOTE — Discharge Instructions (Addendum)
It is suspected that patient has a subconjunctival hemorrhage which is benign and should resolve in the next 2 weeks. Although, do recommend the patient is further evaluated by an eye doctor due to history of eye problems as well as patient complaining of eye irritation.  Please call provided contact information for ophthalmology today for further evaluation and management.  Please try to get in today or tomorrow to be evaluated.

## 2021-07-20 NOTE — ED Triage Notes (Signed)
Per daughter pt woke up with rt eye redness and irritation. States some blurred vision. Denies injury.

## 2021-07-20 NOTE — ED Provider Notes (Signed)
[facility]    CSN: [id number] Arrival date & time: 07/20/21  0912      History   Chief Complaint Chief Complaint  Patient presents with   Eye Pain    HPI Pam Knapp is a 75 y.o. female.   Patient presents with right eye redness and irritation that started this morning upon awakening.  Patient denies any current blurred vision.  Patient denies any pain or itchiness to the eye but states that it "feels irritated".  Patient is not able to elaborate and seems to be poor historian due to history of dementia.  Caregiver denies noticing any current drainage from eye.  Patient and caregiver both deny any injury or foreign body to the eye.  Denies any upper respiratory symptoms or fever. Patient does have history of glaucoma, cataracts, dry.   Eye Pain   Past Medical History:  Diagnosis Date   ADHD    Arthritis    Cataracts, bilateral    Dementia (Pam Knapp)    Depression    Glaucoma    Hypertension    Polymyalgia rheumatica ([place])     Patient Active Problem List   Diagnosis Date Noted   Carpal tunnel syndrome, left upper limb 07/08/2020   Carpal tunnel syndrome, right upper limb 07/08/2020   Alzheimers disease (Pam Knapp) 02/18/2019   Polymyalgia rheumatica (Pam Knapp) 12/06/2018   Body mass index 28.0-28.9, adult 12/06/2018   Essential hypertension 12/06/2018   Memory loss 11/18/2018   Visual disturbance 11/18/2018   Attention deficit 11/18/2018   Depression 11/18/2018    Past Surgical History:  Procedure Laterality Date   PARTIAL HIP ARTHROPLASTY Right 2011   REPLACEMENT TOTAL KNEE Left 2013   Dr Jodell Cipro    OB History   No obstetric history on file.      Home Medications    Prior to Admission medications   Medication Sig Start Date End Date Taking? Authorizing Provider  amLODipine (NORVASC) 10 MG tablet TAKE 1 TABLET BY MOUTH EVERY DAY 04/28/21   [name], Dinah C, NP  aspirin 325 MG tablet Take 325 mg by mouth as needed.     [provider]   brimonidine-timolol (COMBIGAN) 0.2-0.5 % ophthalmic solution Place 1 drop into both eyes every 12 (twelve) hours.    [provider]  buPROPion (WELLBUTRIN XL) 300 MG 24 hr tablet TAKE 1 TABLET BY MOUTH EVERY DAY 03/17/21   [name], Dinah C, NP  Cholecalciferol (VITAMIN D) 125 MCG (5000 UT) CAPS Take 1 capsule by mouth daily.    [provider]  Cranberry-Cholecalciferol 4200-500 MG-UNIT CAPS Take 1 capsule by mouth daily.    [provider]  diclofenac Sodium (VOLTAREN) 1 % GEL Apply 2 g topically 4 (four) times daily. 01/27/21   [name], Dinah C, NP  diphenhydrAMINE (BENADRYL ALLERGY) 25 MG tablet Take 25 mg by mouth every evening.    [provider]  donepezil (ARICEPT) 10 MG tablet Decrease from 10 mg tablet to 5 mg tablet daily x 1 week then stop. 04/26/21   [name], Dinah C, NP  doxazosin (CARDURA) 4 MG tablet TAKE 1 TABLET BY MOUTH EVERY DAY 04/06/21   [name], Dinah C, NP  gabapentin (NEURONTIN) 100 MG capsule Take 1 capsule (100 mg total) by mouth at bedtime. 10/19/20   [name], Dinah C, NP  Latanoprostene Bunod (VYZULTA) 0.024 % SOLN Apply to eye daily.    [provider]  Loperamide HCl (IMODIUM PO) Take by mouth at bedtime.    [provider]  meloxicam (MOBIC) 7.5 MG tablet Take 1 tablet (7.5 mg total) by mouth daily. 03/21/21   [name], Dinah C, NP  memantine (NAMENDA) 10 MG tablet Take 1 tablet (10 mg total) by mouth 2 (two) times daily. 09/06/20   Knapp, Amy, NP  Menthol-Methyl Salicylate (SALONPAS PAIN RELIEF PATCH) PTCH Apply 1 application topically daily. 01/27/21   [name], Dinah C, NP  ofloxacin (OCUFLOX) 0.3 % ophthalmic solution Place 1 drop into the right eye 4 (four) times daily. 11/12/20   [provider]  Omega-3 Fatty Acids (OMEGA 3 PO) Take 750 mg by mouth daily.    [provider]  spironolactone (ALDACTONE) 25 MG tablet TAKE 1 TABLET BY MOUTH TWICE A DAY 11/01/20   [name], Dinah C, NP  Turmeric (QC TUMERIC  COMPLEX PO) Take 15 mLs by mouth daily.    [provider]  vitamin C (ASCORBIC ACID) 500 MG tablet Take 500 mg by mouth daily.    [provider]  XIIDRA 5 % SOLN Place 1 drop into the right eye 2 (two) times daily. 11/04/20   [provider]    Family History Family History  Problem Relation Age of Onset   Diabetes Mother    Kidney failure Mother    High blood pressure Sister    Neuropathy Sister    Heart disease Brother    Asthma Sister    Sleep apnea Sister    Bipolar disorder Sister    Diabetes Sister    Diabetes Sister    Breast cancer Sister    Fibromyalgia Daughter    Colon cancer Other    Stomach cancer Other    Rectal cancer Other    Esophageal cancer Other     Social History Social History   Tobacco Use   Smoking status: Former    Packs/day: 0.50    Years: 20.00    Pack years: 10.00    Types: Cigarettes   Smokeless tobacco: Never   Tobacco comments:    quit over 20 years ago  Vaping Use   Vaping Use: Never used  Substance Use Topics   Alcohol use: Not Currently   Drug use: Never     Allergies   Patient has no known allergies.   Review of Systems Review of Systems Per HPI  Physical Exam Triage Vital Signs ED Triage Vitals  Enc Vitals Group     BP 07/20/21 0955 126/70     Pulse Rate 07/20/21 0955 (!) 51     Resp 07/20/21 0955 20     Temp 07/20/21 0955 97.6 F (36.4 C)     Temp Source 07/20/21 0955 Oral     SpO2 07/20/21 0955 98 %     Weight --      Height --      Head Circumference --      Peak Flow --      Pain Score 07/20/21 0958 0     Pain Loc --      Pain Edu? --      Excl. in [place]? --    No data found.  Updated Vital Signs BP 126/70 (BP Location: Left Arm)   Pulse (!) 51   Temp 97.6 F (36.4 C) (Oral)   Resp 20   SpO2 98%   Visual Acuity Right Eye Distance: 20/70 Left Eye Distance: 20/70 Bilateral Distance: 20/50  Right Eye Near:   Left Eye Near:    Bilateral Near:     Physical  Exam  Constitutional:      General: She is not in acute distress.    Appearance: Normal appearance. She is not toxic-appearing or diaphoretic.  HENT:     Head: Normocephalic and atraumatic.  Eyes:     General: Lids are normal. Lids are everted, no foreign bodies appreciated. Vision grossly intact. Gaze aligned appropriately.        Right eye: No foreign body, discharge or hordeolum.     Extraocular Movements: Extraocular movements intact.     Conjunctiva/sclera: Conjunctivae normal.     Pupils: Pupils are equal, round, and reactive to light.     Right eye: Pupil is round, reactive and not sluggish. No corneal abrasion.     Comments: Redness present throughout sclera.  Pupils normal.  Lids and conjunctivae are normal.  Iris appears normal.  Pulmonary:     Effort: Pulmonary effort is normal.  Neurological:     General: No focal deficit present.     Mental Status: She is alert and oriented to person, place, and time. Mental status is at baseline.  Psychiatric:        Mood and Affect: Mood normal.        Behavior: Behavior normal.        Thought Content: Thought content normal.        Judgment: Judgment normal.     UC Treatments / Results  Labs (all labs ordered are listed, but only abnormal results are displayed) Labs Reviewed - No data to display  EKG   Radiology No results found.  Procedures Procedures (including critical care time)  Medications Ordered in UC Medications - No data to display  Initial Impression / Assessment and Plan / UC Course  I have reviewed the triage vital signs and the nursing notes.  Pertinent labs & imaging results that were available during my care of the patient were reviewed by me and considered in my medical decision making (see chart for details).     It appears on physical exam the patient has  subconjunctival hemorrhage.  Advised patient and caregiver that this should resolve in the next 2 weeks and is benign.  No signs of iritis or  uveitis.  No signs of infection.  Although, patient is reporting "eye irritation", which is concerning.  Patient and caregiver advised to follow-up with eye doctor soon as possible due to history of chronic eye problems and to rule out any other complications.  Caregiver reports that it is difficult to get in with current eye doctor, so patient and caregiver provided with contact information with on-call ophthalmology to call today for further evaluation and management.  No red flags were seen on exam.Discussed strict return precautions. Patient and caregiver verbalized understanding and are agreeable with plan.  Final Clinical Impressions(s) / UC Diagnoses   Final diagnoses:  Subconjunctival hemorrhage of right eye     Discharge Instructions      It is suspected that patient has a subconjunctival hemorrhage which is benign and should resolve in the next 2 weeks. Although, do recommend the patient is further evaluated by an eye doctor due to history of eye problems as well as patient complaining of eye irritation.  Please call provided contact information for ophthalmology today for further evaluation and management.  Please try to get in today or tomorrow to be evaluated.     ED Prescriptions   None    PDMP not reviewed this encounter.   Odis Luster, FNP 07/20/21 1028

## 2021-07-31 ENCOUNTER — Other Ambulatory Visit: Payer: Self-pay | Admitting: Family

## 2021-08-03 ENCOUNTER — Other Ambulatory Visit: Payer: Self-pay | Admitting: Family

## 2021-08-03 DIAGNOSIS — I10 Essential (primary) hypertension: Secondary | ICD-10-CM

## 2021-08-03 DIAGNOSIS — F331 Major depressive disorder, recurrent, moderate: Secondary | ICD-10-CM

## 2021-08-04 ENCOUNTER — Other Ambulatory Visit: Payer: PPO

## 2021-08-04 ENCOUNTER — Other Ambulatory Visit: Payer: Self-pay

## 2021-08-04 DIAGNOSIS — I10 Essential (primary) hypertension: Secondary | ICD-10-CM | POA: Diagnosis not present

## 2021-08-05 LAB — LIPID PANEL
Cholesterol: 154 mg/dL (ref <200)
HDL: 61 mg/dL (ref >=50)
LDL Cholesterol (Calc): 79 mg/dL (calc)
Non-HDL Cholesterol (Calc): 93 mg/dL (calc) (ref <130)
Total CHOL/HDL Ratio: 2.5 (calc) (ref <5.0)
Triglycerides: 59 mg/dL (ref <150)

## 2021-08-05 LAB — COMPLETE METABOLIC PANEL WITH GFR
AG Ratio: 1.5 (calc) (ref 1.0–2.5)
ALT: 8 U/L (ref 6–29)
AST: 13 U/L (ref 10–35)
Albumin: 3.8 g/dL (ref 3.6–5.1)
Alkaline phosphatase (APISO): 43 U/L (ref 37–153)
BUN/Creatinine Ratio: 22 (calc) (ref 6–22)
BUN: 23 mg/dL (ref 7–25)
CO2: 26 mmol/L (ref 20–32)
Calcium: 9.4 mg/dL (ref 8.6–10.4)
Chloride: 108 mmol/L (ref 98–110)
Creat: 1.04 mg/dL — ABNORMAL HIGH (ref 0.60–1.00)
Globulin: 2.6 g/dL (calc) (ref 1.9–3.7)
Glucose, Bld: 91 mg/dL (ref 65–99)
Potassium: 4.9 mmol/L (ref 3.5–5.3)
Sodium: 139 mmol/L (ref 135–146)
Total Bilirubin: 0.4 mg/dL (ref 0.2–1.2)
Total Protein: 6.4 g/dL (ref 6.1–8.1)
eGFR: 56 mL/min/{1.73_m2} — ABNORMAL LOW (ref >=60)

## 2021-08-05 LAB — CBC WITH DIFFERENTIAL/PLATELET
Absolute Monocytes: 500 cells/uL (ref 200–950)
Basophils Absolute: 32 cells/uL (ref 0–200)
Basophils Relative: 0.8 %
Eosinophils Absolute: 272 cells/uL (ref 15–500)
Eosinophils Relative: 6.8 %
HCT: 34.2 % — ABNORMAL LOW (ref 35.0–45.0)
Hemoglobin: 11.2 g/dL — ABNORMAL LOW (ref 11.7–15.5)
Lymphs Abs: 1384 cells/uL (ref 850–3900)
MCH: 31.1 pg (ref 27.0–33.0)
MCHC: 32.7 g/dL (ref 32.0–36.0)
MCV: 95 fL (ref 80.0–100.0)
MPV: 10.3 fL (ref 7.5–12.5)
Monocytes Relative: 12.5 %
Neutro Abs: 1812 cells/uL (ref 1500–7800)
Neutrophils Relative %: 45.3 %
Platelets: 154 10*3/uL (ref 140–400)
RBC: 3.6 10*6/uL — ABNORMAL LOW (ref 3.80–5.10)
RDW: 11.9 % (ref 11.0–15.0)
Total Lymphocyte: 34.6 %
WBC: 4 10*3/uL (ref 3.8–10.8)

## 2021-08-05 LAB — TSH: TSH: 3.08 mIU/L (ref 0.40–4.50)

## 2021-08-08 ENCOUNTER — Ambulatory Visit: Payer: PPO | Admitting: Family

## 2021-08-31 ENCOUNTER — Other Ambulatory Visit: Payer: Self-pay | Admitting: Family

## 2021-08-31 NOTE — Telephone Encounter (Signed)
Patient has request refill on medication "Doxazosin 4mg ". Patient was only give 30 tablets. I refilled for 90 with 1 refill. Please correct information if needed. Patient last refill was 08/01/2021. Patient medication has warnings. Medication pend and sent to Windell Moulding, NP. Please Advise.

## 2021-09-02 ENCOUNTER — Other Ambulatory Visit: Payer: Self-pay | Admitting: Family

## 2021-09-02 DIAGNOSIS — I10 Essential (primary) hypertension: Secondary | ICD-10-CM

## 2021-09-05 ENCOUNTER — Ambulatory Visit: Payer: PPO | Admitting: Family Medicine

## 2021-09-05 ENCOUNTER — Encounter: Payer: Self-pay | Admitting: Family Medicine

## 2021-09-05 VITALS — BP 99/61 | HR 49 | Ht 68.0 in | Wt 173.5 lb

## 2021-09-05 DIAGNOSIS — G301 Alzheimer's disease with late onset: Secondary | ICD-10-CM

## 2021-09-05 DIAGNOSIS — F0280 Dementia in other diseases classified elsewhere without behavioral disturbance: Secondary | ICD-10-CM

## 2021-09-05 DIAGNOSIS — R42 Dizziness and giddiness: Secondary | ICD-10-CM | POA: Diagnosis not present

## 2021-09-05 DIAGNOSIS — R443 Hallucinations, unspecified: Secondary | ICD-10-CM

## 2021-09-05 DIAGNOSIS — R001 Bradycardia, unspecified: Secondary | ICD-10-CM | POA: Diagnosis not present

## 2021-09-05 DIAGNOSIS — F32A Depression, unspecified: Secondary | ICD-10-CM | POA: Diagnosis not present

## 2021-09-05 DIAGNOSIS — G479 Sleep disorder, unspecified: Secondary | ICD-10-CM | POA: Diagnosis not present

## 2021-09-05 MED ORDER — MEMANTINE HCL 10 MG PO TABS: 10.0000 mg | ORAL_TABLET | Freq: Two times a day (BID) | ORAL | 3 refills | Status: DC

## 2021-09-05 MED ORDER — QUETIAPINE FUMARATE 25 MG PO TABS: 12.5000 mg | ORAL_TABLET | Freq: Every day | ORAL | 1 refills | Status: DC

## 2021-09-05 MED ORDER — DONEPEZIL HCL 10 MG PO TABS: ORAL_TABLET | ORAL | 3 refills | Status: DC

## 2021-09-05 NOTE — Patient Instructions (Signed)
Below is our plan:  We will continue donepezil 10mg  daily and memantine 10mg  twice daily. We will try low dose quetiapine 12.5mg  (1/2 tablet) daily at bedtime. I am hopeful this will help with sleep and hallucinations. Please call with any concerns.   Please make sure you are staying well hydrated. I recommend 50-60 ounces daily. Well balanced diet and regular exercise encouraged. Consistent sleep schedule with 6-8 hours recommended.   Please continue follow up with care team as directed.   Follow up with me in 6 months   You may receive a survey regarding today's visit. I encourage you to leave honest feed back as I do use this information to improve patient care. Thank you for seeing me today!    Management of Memory Problems   There are some general things you can do to help manage your memory problems.  Your memory may not in fact recover, but by using techniques and strategies you will be able to manage your memory difficulties better.   1)  Establish a routine. Try to establish and then stick to a regular routine.  By doing this, you will get used to what to expect and you will reduce the need to rely on your memory.  Also, try to do things at the same time of day, such as taking your medication or checking your calendar first thing in the morning. Think about think that you can do as a part of a regular routine and make a list.  Then enter them into a daily planner to remind you.  This will help you establish a routine.   2)  Organize your environment. Organize your environment so that it is uncluttered.  Decrease visual stimulation.  Place everyday items such as keys or cell phone in the same place every day (ie.  Basket next to front door) Use post it notes with a brief message to yourself (ie. Turn off light, lock the door) Use labels to indicate where things go (ie. Which cupboards are for food, dishes, etc.) Keep a notepad and pen by the telephone to take messages   3)  Memory  Aids A diary or journal/notebook/daily planner Making a list (shopping list, chore list, to do list that needs to be done) Using an alarm as a reminder (kitchen timer or cell phone alarm) Using cell phone to store information (Notes, Calendar, Reminders) Calendar/White board placed in a prominent position Post-it notes   In order for memory aids to be useful, you need to have good habits.  It's no good remembering to make a note in your journal if you don't remember to look in it.  Try setting aside a certain time of day to look in journal.   4)  Improving mood and managing fatigue. There may be other factors that contribute to memory difficulties.  Factors, such as anxiety, depression and tiredness can affect memory. Regular gentle exercise can help improve your mood and give you more energy. Simple relaxation techniques may help relieve symptoms of anxiety Try to get back to completing activities or hobbies you enjoyed doing in the past. Learn to pace yourself through activities to decrease fatigue. Find out about some local support groups where you can share experiences with others. Try and achieve 7-8 hours of sleep at night.

## 2021-09-05 NOTE — Progress Notes (Signed)
Chief Complaint  Patient presents with   Follow-up    Rm 2, w daughter. Here for 6 month Alzheimers f/u. Pt reports having near fall back in August. Pt reported feeling dizzy at that time. Doing well overall. MMSE 13, unable to write.     HISTORY OF PRESENT ILLNESS: 09/05/2021 ALL: Pam Knapp returns for follow up for AD. She continues donepezil and memantine. Daughter reports they did not hear back from [facility] but they were able to get her in with [facility] program. She is doing more exercise with [facility] and walks with her neighbor on the weekends. She needs more hands on help with ADLs. She is able to feed herself. Appetite is good. Sleep is broken. Bedtime is typically 7-8pm. She wakes up around 6 most mornings. She may wake up 1-2 times at night. She continues to have intermittent visual hallucinations, mostly at night. Daughter is living with her now. She had a near fall back a few months ago. She reported being dizzy. UC evaluation was unremarkable. No further episodes. She admits that she does not always drink water.   03/08/2021 ALL:  Pam Knapp returns for follow up for AD. We continued donepezil 10mg  daily and increased memantine to 10mg  BID at last visit 09/2020. She is tolerating medications. She continues to decline. She lives with grandson. Daughter in [place]. She is having more periods on confusion. She needs direction with ADLs. She is not sleeping as well. She is having more visual hallucinations, recently. Daughter reports that she will tell them someone was in the home when they know she was alone. Maybe delusions? No auditory hallucinations. She is eating normally. No accidents. No recent falls but does continue to use her cane. Daughter is requesting help at home. Grandson works and has a hard time helping with ADLs.   09/06/2020 ALL:  Pam Knapp is a 75 y.o. female here today for follow up for AD.  Compliance report she reports that she is doing fairly  well today.  She presents with her daughter who aids in history.  She has continued Aricept 10 mg daily and Namenda 5 mg twice daily.  She is tolerating medications without any obvious adverse effects.  She feels that she has good days and bad days.  She continues to live alone.  Her grandson checks on her daily.  He assist her with medications.  Her daughter face times every day to ensure that she has taken her medications.  She is able to perform ADLs independently.  She does not drive.  She is walking for 30 minutes every day with her neighbor.  She uses a cane.  No falls.   HISTORY (copied from previous note)  Pam Knapp is a 75 y.o. female here today for follow up for dementia. She is feels that she is doing well. She feels good and is staying active. She has tolerated increased dose of donepezil. She has continues bupropion as well. Her daughter feels that these medications have been helpful. She lives alone. She is able perform ADL's independently. She is eating normally. She drinks plenty of fluids. Her grandson assists her with medication. She does not drive. Her daughter lives out of state. They face time regularly. PCP placed referral for home health eval. She exercises regularly. No falls. She does use a cane for long distances.     REVIEW OF SYSTEMS: Out of a complete 14 system review of symptoms, the patient complains only of the following  symptoms, memory loss, vision impairment , visual hallucinations, insomnia and all other reviewed systems are negative.   ALLERGIES: No Known Allergies   HOME MEDICATIONS: Outpatient Medications Prior to Visit  Medication Sig Dispense Refill   amLODipine (NORVASC) 10 MG tablet TAKE 1 TABLET BY MOUTH EVERY DAY 90 tablet 1   aspirin 325 MG tablet Take 325 mg by mouth as needed.      brimonidine-timolol (COMBIGAN) 0.2-0.5 % ophthalmic solution Place 1 drop into both eyes every 12 (twelve) hours.     buPROPion (WELLBUTRIN XL) 300 MG 24 hr  tablet TAKE 1 TABLET BY MOUTH EVERY DAY 90 tablet 1   cetirizine (ZYRTEC) 10 MG tablet Take 10 mg by mouth daily.     Cholecalciferol (VITAMIN D) 125 MCG (5000 UT) CAPS Take 1 capsule by mouth daily.     Cranberry-Cholecalciferol 4200-500 MG-UNIT CAPS Take 1 capsule by mouth daily.     diclofenac Sodium (VOLTAREN) 1 % GEL Apply 2 g topically 4 (four) times daily. 100 g 3   doxazosin (CARDURA) 4 MG tablet TAKE 1 TABLET BY MOUTH EVERY DAY 90 tablet 1   gabapentin (NEURONTIN) 100 MG capsule Take 1 capsule (100 mg total) by mouth at bedtime. 90 capsule 3   Latanoprostene Bunod (VYZULTA) 0.024 % SOLN Apply to eye daily.     Loperamide HCl (IMODIUM PO) Take by mouth at bedtime.     meloxicam (MOBIC) 7.5 MG tablet Take 1 tablet (7.5 mg total) by mouth daily. 30 tablet 6   Omega-3 Fatty Acids (OMEGA 3 PO) Take 750 mg by mouth daily.     spironolactone (ALDACTONE) 25 MG tablet TAKE 1 TABLET BY MOUTH TWICE DAILY 180 tablet 1   Turmeric (QC TUMERIC COMPLEX PO) Take 15 mLs by mouth daily.     vitamin C (ASCORBIC ACID) 500 MG tablet Take 500 mg by mouth daily.     donepezil (ARICEPT) 10 MG tablet Decrease from 10 mg tablet to 5 mg tablet daily x 1 week then stop. 90 tablet 0   memantine (NAMENDA) 10 MG tablet Take 1 tablet (10 mg total) by mouth 2 (two) times daily. 180 tablet 3   diphenhydrAMINE (BENADRYL ALLERGY) 25 MG tablet Take 25 mg by mouth every evening.     Menthol-Methyl Salicylate (SALONPAS PAIN RELIEF PATCH) PTCH Apply 1 application topically daily. 30 patch 3   ofloxacin (OCUFLOX) 0.3 % ophthalmic solution Place 1 drop into the right eye 4 (four) times daily.     XIIDRA 5 % SOLN Place 1 drop into the right eye 2 (two) times daily.     No facility-administered medications prior to visit.     PAST MEDICAL HISTORY: Past Medical History:  Diagnosis Date   ADHD    Arthritis    Cataracts, bilateral    Dementia ([place])    Depression    Glaucoma    Hypertension    Polymyalgia rheumatica  ([place])      PAST SURGICAL HISTORY: Past Surgical History:  Procedure Laterality Date   PARTIAL HIP ARTHROPLASTY Right 2011   REPLACEMENT TOTAL KNEE Left 2013   Dr Jodell Cipro     FAMILY HISTORY: Family History  Problem Relation Age of Onset   Diabetes Mother    Kidney failure Mother    High blood pressure Sister    Neuropathy Sister    Heart disease Brother    Asthma Sister    Sleep apnea Sister    Bipolar disorder Sister    Diabetes Sister  Diabetes Sister    Breast cancer Sister    Fibromyalgia Daughter    Colon cancer Other    Stomach cancer Other    Rectal cancer Other    Esophageal cancer Other      SOCIAL HISTORY: Social History   Socioeconomic History   Marital status: Widowed    Spouse name: Not on file   Number of children: 1   Years of education: BS   Highest education level: Not on file  Occupational History   Occupation: Retired  Tobacco Use   Smoking status: Former    Packs/day: 0.50    Years: 20.00    Pack years: 10.00    Types: Cigarettes   Smokeless tobacco: Never   Tobacco comments:    quit over 20 years ago  Vaping Use   Vaping Use: Never used  Substance and Sexual Activity   Alcohol use: Not Currently   Drug use: Never   Sexual activity: Not on file  Other Topics Concern   Not on file  Social History Narrative   Lives alone   Caffeine use: 2 coffees per day   Right handed       Social History      Diet? Regular diet       Do you drink/eat things with caffeine? Yes coffee      Marital status?                widow                    What year were you married? [place] you live in a house, apartment, assisted living, condo, trailer, etc.? Apartment       Is it one or more stories? one      How many persons live in your home? one      Do you have any pets in your home? (please list) cockatiel       Highest level of education completed? 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    yes                                  Type & how often? Daily chair aerobics - water aerobics       Advanced Directives      Do you have a living will? No       Do you have a DNR form?            no                      If not, do you want to discuss one? yes      Do you have signed POA/HPOA for forms? No       Functional Status      Do you have difficulty bathing or dressing yourself? No       Do you have difficulty preparing food or eating? No       Do you have difficulty managing your medications? yes      Do you have difficulty managing your finances? Yes daughter manages       Do you have difficulty affording your medications? no      Social Determinants of Health   Financial Resource Strain: Not on  file  Food Insecurity: Not on file  Transportation Needs: Not on file  Physical Activity: Not on file  Stress: Not on file  Social Connections: Not on file  Intimate Partner Violence: Not on file      PHYSICAL EXAM  Vitals:   09/05/21 1016  BP: 99/61  Pulse: (!) 49  Weight: 173 lb 8 oz (78.7 kg)  Height: 5\' 8"  (1.727 m)    Body mass index is 26.38 kg/m.   Generalized: Well developed, in no acute distress  Cardiology: normal rate and rhythm, no murmur auscultated  Respiratory: clear to auscultation bilaterally    Neurological examination  Mentation: Alert not oriented to time, place, or history taking. Repetitive in question asking. Follows some commands but needs continued guidance and instruction,  speech and language fluent Cranial nerve II-XII: Pupils were equal round reactive to light. Extraocular movements were full, visual field were full on confrontational test. Facial sensation and strength were normal. Head turning and shoulder shrug  were normal and symmetric. Motor: The motor testing reveals 5 over 5 strength of all 4 extremities. Good symmetric motor tone is noted throughout.  Sensory: Sensory testing is intact to soft touch on all 4  extremities. No evidence of extinction is noted.  Coordination: Cerebellar testing reveals reduced (does not understand instructions) finger-nose-finger and good heel-to-shin bilaterally.  Gait and station: Gait is stable with cane    DIAGNOSTIC DATA (LABS, IMAGING, TESTING) - I reviewed patient records, labs, notes, testing and imaging myself where available.  Lab Results  Component Value Date   WBC 4.0 08/04/2021   HGB 11.2 (L) 08/04/2021   HCT 34.2 (L) 08/04/2021   MCV 95.0 08/04/2021   PLT 154 08/04/2021      Component Value Date/Time   NA 139 08/04/2021 0803   K 4.9 08/04/2021 0803   CL 108 08/04/2021 0803   CO2 26 08/04/2021 0803   GLUCOSE 91 08/04/2021 0803   BUN 23 08/04/2021 0803   CREATININE 1.04 (H) 08/04/2021 0803   CALCIUM 9.4 08/04/2021 0803   PROT 6.4 08/04/2021 0803   ALBUMIN 3.9 11/17/2009 1457   AST 13 08/04/2021 0803   ALT 8 08/04/2021 0803   ALKPHOS 77 11/17/2009 1457   BILITOT 0.4 08/04/2021 0803   GFRNONAA 66 11/18/2019 0852   GFRAA 76 11/18/2019 0852   Lab Results  Component Value Date   CHOL 154 08/04/2021   HDL 61 08/04/2021   LDLCALC 79 08/04/2021   TRIG 59 08/04/2021   CHOLHDL 2.5 08/04/2021   Lab Results  Component Value Date   HGBA1C 5.6 12/06/2018   Lab Results  Component Value Date   VITAMINB12 652 11/18/2018   Lab Results  Component Value Date   TSH 3.08 08/04/2021   MMSE - Mini Mental State Exam 09/05/2021 03/08/2021 09/06/2020  Orientation to time 0 1 3  Orientation to Place 4 3 4   Registration 3 3 2   Attention/ Calculation 0 0 2  Recall 0 0 0  Language- name 2 objects 2 2 2   Language- repeat 0 1 1  Language- follow 3 step command 3 1 3   Language- read & follow direction 1 1 1   Write a sentence 0 1 1  Write a sentence-comments UTO - -  Copy design 0 0 0  Copy design-comments UTO - -  Total score 13 13 19     Montreal Cognitive Assessment  03/03/2020 09/03/2019 09/03/2019 11/18/2018  Visuospatial/ Executive (0/5) 3  1 1 2   Naming (0/3) 2  2 2 2   Attention: Read list of digits (0/2) 1 1 1 2   Attention: Read list of letters (0/1) 1 1 1 1   Attention: Serial 7 subtraction starting at 100 (0/3) 1 1 1 1   Language: Repeat phrase (0/2) 2 1 1 1   Language : Fluency (0/1) 1 0 0 0  Abstraction (0/2) 1 2 2 2   Delayed Recall (0/5) 0 0 0 0  Orientation (0/6) 4 4 4 5   Total 16 13 13 16   Adjusted Score (based on education) - - 39 17      ASSESSMENT AND PLAN  75 y.o. year old female  has a past medical history of ADHD, Arthritis, Cataracts, bilateral, Dementia ([place]), Depression, Glaucoma, Hypertension, and Polymyalgia rheumatica ([place]). here with   Late onset Alzheimer's disease without behavioral disturbance ([place])  Dizziness - Plan: donepezil (ARICEPT) 10 MG tablet  Bradycardia - Plan: donepezil (ARICEPT) 10 MG tablet  Depression, unspecified depression type  Hallucination  Difficulty sleeping  Falicity is doing well, today. Memoy seems stable. MMSE performed today 13/30.  Previously 13/30. She has tolerated Aricept and Namenda. We will continue Arcipet 10mg  daily and Namenda 10mg  BID. We have discussed low heart rate. Readings range from 49-71 over the past few years. PCP decreased donepezil to 5mg  over the summer for 1 week due to dizziness. Daughter restarted donepezil 10mg  daily and denies any further episodes of dizziness. She is aware to montior her blood pressure and pulse closely at home. We will trial low dose of quetiapine 12.5mg  daily at bedtime to see if this will help with interrupted sleep and hallucinations. She was advised on possible side effects and will call with any concerns. She will continue participation with [facility]. She will continue healthy lifestyle habits. Safety precautions discussed with daughter. She will follow up with me in 6 months. She and her daughter verbalize understanding and agreement with this plan.     Debbora Presto, MSN, FNP-C 09/05/2021, 11:37 AM  [facility] [address] [place] [address] [place], [place] [address] [phone]

## 2021-09-30 ENCOUNTER — Other Ambulatory Visit: Payer: Self-pay | Admitting: Family

## 2021-09-30 DIAGNOSIS — F331 Major depressive disorder, recurrent, moderate: Secondary | ICD-10-CM

## 2021-09-30 DIAGNOSIS — G8929 Other chronic pain: Secondary | ICD-10-CM

## 2021-09-30 DIAGNOSIS — G629 Polyneuropathy, unspecified: Secondary | ICD-10-CM

## 2021-10-06 ENCOUNTER — Other Ambulatory Visit: Payer: Self-pay

## 2021-10-06 ENCOUNTER — Encounter: Payer: Self-pay | Admitting: Family

## 2021-10-06 ENCOUNTER — Ambulatory Visit (INDEPENDENT_AMBULATORY_CARE_PROVIDER_SITE_OTHER): Payer: PPO | Admitting: Family

## 2021-10-06 VITALS — BP 170/90 | HR 53 | Temp 97.0°F | Resp 16 | Ht 68.0 in | Wt 178.0 lb

## 2021-10-06 DIAGNOSIS — Z780 Asymptomatic menopausal state: Secondary | ICD-10-CM

## 2021-10-06 DIAGNOSIS — M25512 Pain in left shoulder: Secondary | ICD-10-CM

## 2021-10-06 DIAGNOSIS — F331 Major depressive disorder, recurrent, moderate: Secondary | ICD-10-CM | POA: Diagnosis not present

## 2021-10-06 DIAGNOSIS — G8929 Other chronic pain: Secondary | ICD-10-CM

## 2021-10-06 DIAGNOSIS — M25552 Pain in left hip: Secondary | ICD-10-CM | POA: Diagnosis not present

## 2021-10-06 DIAGNOSIS — Z23 Encounter for immunization: Secondary | ICD-10-CM

## 2021-10-06 DIAGNOSIS — F0280 Dementia in other diseases classified elsewhere without behavioral disturbance: Secondary | ICD-10-CM

## 2021-10-06 DIAGNOSIS — G301 Alzheimer's disease with late onset: Secondary | ICD-10-CM | POA: Diagnosis not present

## 2021-10-06 DIAGNOSIS — R300 Dysuria: Secondary | ICD-10-CM | POA: Diagnosis not present

## 2021-10-06 DIAGNOSIS — H6123 Impacted cerumen, bilateral: Secondary | ICD-10-CM

## 2021-10-06 DIAGNOSIS — E782 Mixed hyperlipidemia: Secondary | ICD-10-CM

## 2021-10-06 DIAGNOSIS — G629 Polyneuropathy, unspecified: Secondary | ICD-10-CM

## 2021-10-06 DIAGNOSIS — I10 Essential (primary) hypertension: Secondary | ICD-10-CM | POA: Diagnosis not present

## 2021-10-06 LAB — POCT URINALYSIS DIPSTICK
Bilirubin, UA: NEGATIVE
Blood, UA: NEGATIVE
Glucose, UA: NEGATIVE
Ketones, UA: POSITIVE
Nitrite, UA: NEGATIVE
Protein, UA: NEGATIVE
Spec Grav, UA: 1.015 (ref 1.010–1.025)
Urobilinogen, UA: NEGATIVE E.U./dL — AB
pH, UA: 5 (ref 5.0–8.0)

## 2021-10-06 MED ORDER — DEBROX 6.5 % OT SOLN: 5.0000 [drp] | Freq: Two times a day (BID) | OTIC | 0 refills | Status: AC

## 2021-10-06 MED ORDER — TETANUS-DIPHTH-ACELL PERTUSSIS 5-2.5-18.5 LF-MCG/0.5 IM SUSP: 0.5000 mL | Freq: Once | INTRAMUSCULAR | 0 refills | Status: AC

## 2021-10-06 NOTE — Patient Instructions (Signed)
-   Instill debrox 6.5 otic solution 5 drops into each ear twice daily x 4 days then follow up for ear lavage.May apply cotton ball at bedtime to prevent drainage to pillow.  

## 2021-10-06 NOTE — Progress Notes (Signed)
Provider: [name]   Shyan Scalisi, [name], NP  Patient Care Team: Yaakov Saindon, [name], NP as PCP - General (Family Medicine) [name], MD as Consulting Physician (Ophthalmology) Felecia Shelling, Nanine Means, MD (Neurology)  Extended Emergency Contact Information Primary Emergency Contact: [name] Address: [place] [address] [name] Work Phone: [phone] Mobile Phone: [phone] Relation: Daughter Secondary Emergency Contact: [name],[name]  [place] Phone: [phone] Relation: Grandson  Code Status:  Full Code  Goals of care: Advanced Directive information Advanced Directives 10/06/2021  Does Patient Have a Medical Advance Directive? Yes  Type of Paramedic of [place];Living will  Does patient want to make changes to medical advance directive? No - Patient declined  Copy of [place] in Chart? Yes - validated most recent copy scanned in chart (See row information)  Would patient like information on creating a medical advance directive? -     Chief Complaint  Patient presents with   Medical Management of Chronic Issues    6 month follow up   Health Maintenance    Discuss the need for Dexa Scan.   Immunizations    Discuss the need for Tetanus vaccine, Shingrix vaccine, Covid Booster, and Influenza vaccine.    HPI:  Pt is a 75 y.o. female seen today for 6 months follow up medical management of chronic diseases.she is here with her daughter. Daughter states memory has declined now requires assistance with her ADL's. She goes to Adult day care program 5 times per week. No fall episode since she was las t seen here in the past six months. Weight is stable states eat well.  Back ,left hip pain well controlled on current pain medication.States exercise helps.  Tetanus shot was send to pharmacy last visit but did not get shot yet.will need another script  today. Also due for her shingles and COVID-19 booster vaccine.daughter aware to take to pharmacy for vaccine.    Past Medical History:  Diagnosis Date   ADHD    Arthritis    Cataracts, bilateral    Dementia ([place])    Depression    Glaucoma    Hypertension    Polymyalgia rheumatica ([place])    Past Surgical History:  Procedure Laterality Date   PARTIAL HIP ARTHROPLASTY Right 2011   REPLACEMENT TOTAL KNEE Left 2013   Dr Jodell Cipro    No Known Allergies  Allergies as of 10/06/2021   No Known Allergies      Medication List        Accurate as of October 06, 2021  9:48 AM. If you have any questions, ask your nurse or doctor.          amLODipine 10 MG tablet Commonly known as: NORVASC TAKE 1 TABLET BY MOUTH EVERY DAY   aspirin 325 MG tablet Take 325 mg by mouth as needed.   buPROPion 300 MG 24 hr tablet Commonly known as: WELLBUTRIN XL TAKE 1 TABLET BY MOUTH EVERY DAY   cetirizine 10 MG tablet Commonly known as: ZYRTEC Take 10 mg by mouth daily.   Combigan 0.2-0.5 % ophthalmic solution Generic drug: brimonidine-timolol Place 1 drop into both eyes every 12 (twelve) hours.   Cranberry-Cholecalciferol 4200-500 MG-UNIT Caps Take 1 capsule by mouth daily.   diclofenac Sodium 1 % Gel Commonly known as: Voltaren Apply 2 g topically 4 (four) times daily.   donepezil 10 MG tablet  Commonly known as: ARICEPT Decrease from 10 mg tablet to 5 mg tablet daily x 1 week then stop.   doxazosin 4 MG tablet Commonly known as: CARDURA TAKE 1 TABLET BY MOUTH EVERY DAY   gabapentin 100 MG capsule Commonly known as: NEURONTIN TAKE 1 CAPSULE BY MOUTH AT BEDTIME.   IMODIUM PO Take by mouth at bedtime.   meloxicam 7.5 MG tablet Commonly known as: MOBIC Take 1 tablet (7.5 mg total) by mouth daily.   memantine 10 MG tablet Commonly known as: Namenda Take 1 tablet (10 mg total) by mouth 2 (two) times daily.   OMEGA 3 PO Take 750 mg by mouth daily.   QC TUMERIC COMPLEX  PO Take 15 mLs by mouth daily.   QUEtiapine 25 MG tablet Commonly known as: SEROquel Take 0.5 tablets (12.5 mg total) by mouth at bedtime.   spironolactone 25 MG tablet Commonly known as: ALDACTONE TAKE 1 TABLET BY MOUTH TWICE DAILY   vitamin C 500 MG tablet Commonly known as: ASCORBIC ACID Take 500 mg by mouth daily.   Vitamin D 125 MCG (5000 UT) Caps Take 1 capsule by mouth daily.   Vyzulta 0.024 % Soln Generic drug: Latanoprostene Bunod Apply to eye daily.        Review of Systems  Constitutional:  Negative for appetite change, chills, fatigue, fever and unexpected weight change.  HENT:  Negative for congestion, dental problem, ear discharge, ear pain, facial swelling, hearing loss, nosebleeds, postnasal drip, rhinorrhea, sinus pressure, sinus pain, sneezing, sore throat, tinnitus and trouble swallowing.   Eyes:  Negative for pain, discharge, redness, itching and visual disturbance.  Respiratory:  Negative for cough, chest tightness, shortness of breath and wheezing.   Cardiovascular:  Negative for chest pain, palpitations and leg swelling.  Gastrointestinal:  Negative for abdominal distention, abdominal pain, blood in stool, constipation, diarrhea, nausea and vomiting.  Endocrine: Negative for cold intolerance, heat intolerance, polydipsia, polyphagia and polyuria.  Genitourinary:  Positive for dysuria and frequency. Negative for difficulty urinating, flank pain and urgency.  Musculoskeletal:  Positive for arthralgias, back pain and gait problem. Negative for joint swelling, myalgias, neck pain and neck stiffness.  Skin:  Negative for color change, pallor, rash and wound.  Neurological:  Negative for dizziness, syncope, speech difficulty, weakness, light-headedness, numbness and headaches.  Hematological:  Does not bruise/bleed easily.  Psychiatric/Behavioral:  Negative for agitation, behavioral problems, confusion, hallucinations, self-injury, sleep disturbance and  suicidal ideas. The patient is not nervous/anxious.        Worsening memory loss    Immunization History  Administered Date(s) Administered   Fluad Quad(high Dose 65+) 10/06/2021   Influenza, High Dose Seasonal PF 07/29/2017, 08/30/2018, 08/14/2019   Influenza-Unspecified 08/12/2020   PFIZER(Purple Top)SARS-COV-2 Vaccination 11/20/2019, 12/15/2019, 09/09/2020   Pneumococcal Conjugate-13 09/09/2019   Pneumococcal Polysaccharide-23 11/22/2016   Zoster Recombinat (Shingrix) 10/22/2018   Pertinent  Health Maintenance Due  Topic Date Due   DEXA SCAN  Never done   INFLUENZA VACCINE  Completed   COLONOSCOPY (Pts 45-1yrs Insurance coverage will need to be confirmed)  Discontinued   Fall Risk 03/21/2021 04/26/2021 05/19/2021 07/20/2021 10/06/2021  Falls in the past year? 0 1 0 - 0  Was there an injury with Fall? 0 1 0 - 0  Fall Risk Category Calculator 0 3 0 - 0  Fall Risk Category Low High Low - Low  Patient Fall Risk Level Low fall risk Low fall risk Low fall risk Low fall risk Low fall risk  Patient at  Risk for Falls Due to - History of fall(s) No Fall Risks - No Fall Risks  Fall risk Follow up - Falls evaluation completed Falls evaluation completed - Falls evaluation completed   Functional Status Survey:    Vitals:   10/06/21 0922  BP: (!) 170/90  Pulse: (!) 53  Resp: 16  Temp: (!) 97 F (36.1 C)  SpO2: 98%  Weight: 178 lb (80.7 kg)  Height: $Remove'5\' 8"'jHsftrC$  (1.727 m)   Body mass index is 27.06 kg/m. Physical Exam Vitals reviewed.  Constitutional:      General: She is not in acute distress.    Appearance: Normal appearance. She is normal weight. She is not ill-appearing or diaphoretic.  HENT:     Head: Normocephalic.     Right Ear: There is impacted cerumen.     Left Ear: There is impacted cerumen.     Nose: Nose normal. No congestion or rhinorrhea.     Mouth/Throat:     Mouth: Mucous membranes are moist.     Pharynx: Oropharynx is clear. No oropharyngeal exudate or posterior  oropharyngeal erythema.  Eyes:     General: No scleral icterus.       Right eye: No discharge.        Left eye: No discharge.     Extraocular Movements: Extraocular movements intact.     Conjunctiva/sclera: Conjunctivae normal.     Pupils: Pupils are equal, round, and reactive to light.  Neck:     Vascular: No carotid bruit.  Cardiovascular:     Rate and Rhythm: Normal rate and regular rhythm.     Pulses: Normal pulses.     Heart sounds: Normal heart sounds. No murmur heard.   No friction rub. No gallop.  Pulmonary:     Effort: Pulmonary effort is normal. No respiratory distress.     Breath sounds: Normal breath sounds. No wheezing, rhonchi or rales.  Chest:     Chest wall: No tenderness.  Abdominal:     General: Bowel sounds are normal. There is no distension.     Palpations: Abdomen is soft. There is no mass.     Tenderness: There is no abdominal tenderness. There is no right CVA tenderness, left CVA tenderness, guarding or rebound.  Musculoskeletal:        General: No swelling or tenderness. Normal range of motion.     Cervical back: Normal range of motion. No rigidity or tenderness.     Right lower leg: No edema.     Left lower leg: No edema.  Lymphadenopathy:     Cervical: No cervical adenopathy.  Skin:    General: Skin is warm and dry.     Coloration: Skin is not pale.     Findings: No bruising, erythema, lesion or rash.  Neurological:     Mental Status: She is alert. Mental status is at baseline.     Cranial Nerves: No cranial nerve deficit.     Sensory: No sensory deficit.     Motor: No weakness.     Coordination: Coordination normal.     Gait: Gait abnormal.  Psychiatric:        Mood and Affect: Mood normal.        Speech: Speech normal.        Behavior: Behavior normal.        Thought Content: Thought content normal.        Cognition and Memory: Memory is impaired.        Judgment: Judgment  normal.    Labs reviewed: Recent Labs    08/04/21 0803  NA 139   K 4.9  CL 108  CO2 26  GLUCOSE 91  BUN 23  CREATININE 1.04*  CALCIUM 9.4   Recent Labs    08/04/21 0803  AST 13  ALT 8  BILITOT 0.4  PROT 6.4   Recent Labs    08/04/21 0803  WBC 4.0  NEUTROABS 1,812  HGB 11.2*  HCT 34.2*  MCV 95.0  PLT 154   Lab Results  Component Value Date   TSH 3.08 08/04/2021   Lab Results  Component Value Date   HGBA1C 5.6 12/06/2018   Lab Results  Component Value Date   CHOL 154 08/04/2021   HDL 61 08/04/2021   LDLCALC 79 08/04/2021   TRIG 59 08/04/2021   CHOLHDL 2.5 08/04/2021    Significant Diagnostic Results in last 30 days:  No results found.  Assessment/Plan 1. Need for influenza vaccination Afebrile  Flu shot administered by CMA no acute reaction reported.  - Flu Vaccine QUAD High Dose(Fluad)  2. Essential hypertension B/p slightly high this visit.No home readings for review.Recent B/p done at Neurologist was low 99/61 -  Advised to check Blood pressure at home and notify provider if B/p > 140/90  - continue on Amlodipine 10 mg tablet  - CBC with Differential/Platelet - CMP with eGFR([facility]) - TSH  3. Late onset Alzheimer's disease without behavioral disturbance ([place]) Memory has progressively declined. Continue on Seroquel,donepezil and memantine   4. Neuropathy Continue on Gabapentin   5. Hip pain, chronic, left Continue on current pain regimen   6. Moderate episode of recurrent major depressive disorder (H[facility]) Continue on Bupropion  - TSH  7. Chronic left shoulder pain Continue on mobic   8. Need for Tdap vaccination Afebrile Flut shot administered by CMA no acute reaction reported.  - Tdap (BOOSTRIX) 5-2.5-18.5 LF-MCG/0.5 injection; Inject 0.5 mLs into the muscle once for 1 dose.  Dispense: 0.5 mL; Refill: 0  9. Dysuria Afebrile  - encourage fluid intake  - POC Urinalysis Dipstick urine yellow cloudy positive for ketones  and large 3+ Leukocytes  but negative. - Urine Culture  10. Mixed  hyperlipidemia LDL at goal  - Lipid Panel  11. Postmenopausal estrogen deficiency No fall or fracture  - DG Bone Density; Future  12. Bilateral impacted cerumen TM not visualized  - Instill debrox 6.5 otic solution 5 drops into each ear twice daily x 4 days then follow up for ear lavage.May apply cotton ball at bedtime to prevent drainage to pillow. - carbamide peroxide (DEBROX) 6.5 % OTIC solution; Place 5 drops into both ears 2 (two) times daily for 4 days.  Dispense: 2 mL; Refill: 0  Family/ staff Communication: Reviewed plan of care with patient and daughter verbalized understanding   Labs/tests ordered:  - CBC with Differential/Platelet - CMP with eGFR([facility]) - TSH - Lipid panel  Next Appointment : medical management of chronic issues with fasting labs.one week for bilateral ear lavage.  Sandrea Hughs, NP

## 2021-10-07 LAB — URINE CULTURE
MICRO NUMBER:: 12809487
SPECIMEN QUALITY:: ADEQUATE

## 2021-10-07 LAB — COMPLETE METABOLIC PANEL WITH GFR
AG Ratio: 1.3 (calc) (ref 1.0–2.5)
ALT: 8 U/L (ref 6–29)
AST: 15 U/L (ref 10–35)
Albumin: 4.1 g/dL (ref 3.6–5.1)
Alkaline phosphatase (APISO): 43 U/L (ref 37–153)
BUN/Creatinine Ratio: 23 (calc) — ABNORMAL HIGH (ref 6–22)
BUN: 26 mg/dL — ABNORMAL HIGH (ref 7–25)
CO2: 27 mmol/L (ref 20–32)
Calcium: 9.4 mg/dL (ref 8.6–10.4)
Chloride: 107 mmol/L (ref 98–110)
Creat: 1.13 mg/dL — ABNORMAL HIGH (ref 0.60–1.00)
Globulin: 3.1 g/dL (calc) (ref 1.9–3.7)
Glucose, Bld: 78 mg/dL (ref 65–99)
Potassium: 4.9 mmol/L (ref 3.5–5.3)
Sodium: 142 mmol/L (ref 135–146)
Total Bilirubin: 0.4 mg/dL (ref 0.2–1.2)
Total Protein: 7.2 g/dL (ref 6.1–8.1)
eGFR: 50 mL/min/{1.73_m2} — ABNORMAL LOW (ref >=60)

## 2021-10-07 LAB — LIPID PANEL
Cholesterol: 172 mg/dL (ref <200)
HDL: 63 mg/dL (ref >=50)
LDL Cholesterol (Calc): 92 mg/dL (calc)
Non-HDL Cholesterol (Calc): 109 mg/dL (calc) (ref <130)
Total CHOL/HDL Ratio: 2.7 (calc) (ref <5.0)
Triglycerides: 79 mg/dL (ref <150)

## 2021-10-07 LAB — CBC WITH DIFFERENTIAL/PLATELET
Absolute Monocytes: 525 cells/uL (ref 200–950)
Basophils Absolute: 30 cells/uL (ref 0–200)
Basophils Relative: 0.6 %
Eosinophils Absolute: 220 cells/uL (ref 15–500)
Eosinophils Relative: 4.4 %
HCT: 37 % (ref 35.0–45.0)
Hemoglobin: 12.3 g/dL (ref 11.7–15.5)
Lymphs Abs: 1420 cells/uL (ref 850–3900)
MCH: 31.6 pg (ref 27.0–33.0)
MCHC: 33.2 g/dL (ref 32.0–36.0)
MCV: 95.1 fL (ref 80.0–100.0)
MPV: 10.4 fL (ref 7.5–12.5)
Monocytes Relative: 10.5 %
Neutro Abs: 2805 cells/uL (ref 1500–7800)
Neutrophils Relative %: 56.1 %
Platelets: 166 10*3/uL (ref 140–400)
RBC: 3.89 10*6/uL (ref 3.80–5.10)
RDW: 12 % (ref 11.0–15.0)
Total Lymphocyte: 28.4 %
WBC: 5 10*3/uL (ref 3.8–10.8)

## 2021-10-07 LAB — TSH: TSH: 2.94 mIU/L (ref 0.40–4.50)

## 2021-10-12 ENCOUNTER — Ambulatory Visit (INDEPENDENT_AMBULATORY_CARE_PROVIDER_SITE_OTHER): Payer: PPO | Admitting: Family

## 2021-10-12 ENCOUNTER — Encounter: Payer: Self-pay | Admitting: Family

## 2021-10-12 ENCOUNTER — Other Ambulatory Visit: Payer: Self-pay

## 2021-10-12 VITALS — BP 130/88 | HR 51 | Temp 96.4°F | Resp 16 | Ht 68.0 in | Wt 176.2 lb

## 2021-10-12 DIAGNOSIS — H6123 Impacted cerumen, bilateral: Secondary | ICD-10-CM

## 2021-10-12 NOTE — Progress Notes (Signed)
Provider: [name]  Broc Caspers, [name], NP  Patient Care Team: Norris Bodley, [name], NP as PCP - General (Family Medicine) [name], MD as Consulting Physician (Ophthalmology) Felecia Shelling, Nanine Means, MD (Neurology)  Extended Emergency Contact Information Primary Emergency Contact: [name] Address: [place] [address] [name] Work Phone: [phone] Mobile Phone: [phone] Relation: Daughter Secondary Emergency Contact: [name],[name]  [place] Phone: [phone] Relation: Grandson  Code Status:  DNR Goals of care: Advanced Directive information Advanced Directives 10/12/2021  Does Patient Have a Medical Advance Directive? Yes  Type of Paramedic of [place];Living will  Does patient want to make changes to medical advance directive? No - Patient declined  Copy of [place] in Chart? Yes - validated most recent copy scanned in chart (See row information)  Would patient like information on creating a medical advance directive? -     Chief Complaint  Patient presents with   Follow-up    Ear lavage for both ears.     HPI:  Pt is a 76 y.o. female seen today for an acute visit for bilateral ear lavage.she was was here 10/06/2021 for routine visit  bilateral cerumen impaction was noted. She was advised to instil debrox 6.5 % otic solution 5 drops into each ear twice daily x 4 days then follow up today for ear lavage. Has used debrox as directed.denies any fever,chills,pain,ringing or discharge from the ear.   Past Medical History:  Diagnosis Date   ADHD    Arthritis    Cataracts, bilateral    Dementia ([place])    Depression    Glaucoma    Hypertension    Polymyalgia rheumatica ([place])    Past Surgical History:  Procedure Laterality Date   PARTIAL HIP ARTHROPLASTY Right 2011   REPLACEMENT TOTAL KNEE Left 2013   Dr Jodell Cipro    No Known  Allergies  Outpatient Encounter Medications as of 10/12/2021  Medication Sig   amLODipine (NORVASC) 10 MG tablet TAKE 1 TABLET BY MOUTH EVERY DAY   aspirin 325 MG tablet Take 325 mg by mouth as needed.    brimonidine-timolol (COMBIGAN) 0.2-0.5 % ophthalmic solution Place 1 drop into both eyes every 12 (twelve) hours.   buPROPion (WELLBUTRIN XL) 300 MG 24 hr tablet TAKE 1 TABLET BY MOUTH EVERY DAY   cetirizine (ZYRTEC) 10 MG tablet Take 10 mg by mouth daily.   Cholecalciferol (VITAMIN D) 125 MCG (5000 UT) CAPS Take 1 capsule by mouth daily.   Cranberry-Cholecalciferol 4200-500 MG-UNIT CAPS Take 1 capsule by mouth daily.   diclofenac Sodium (VOLTAREN) 1 % GEL Apply 2 g topically 4 (four) times daily.   donepezil (ARICEPT) 10 MG tablet Decrease from 10 mg tablet to 5 mg tablet daily x 1 week then stop.   doxazosin (CARDURA) 4 MG tablet TAKE 1 TABLET BY MOUTH EVERY DAY   gabapentin (NEURONTIN) 100 MG capsule TAKE 1 CAPSULE BY MOUTH AT BEDTIME.   Latanoprostene Bunod (VYZULTA) 0.024 % SOLN Apply to eye daily.   Loperamide HCl (IMODIUM PO) Take by mouth at bedtime.   meloxicam (MOBIC) 7.5 MG tablet Take 1 tablet (7.5 mg total) by mouth daily.   memantine (NAMENDA) 10 MG tablet Take 1 tablet (10 mg total) by mouth 2 (two) times daily.   Omega-3 Fatty Acids (OMEGA 3 PO) Take 750 mg by mouth daily.   QUEtiapine (SEROQUEL) 25  MG tablet Take 0.5 tablets (12.5 mg total) by mouth at bedtime.   spironolactone (ALDACTONE) 25 MG tablet TAKE 1 TABLET BY MOUTH TWICE DAILY   Turmeric (QC TUMERIC COMPLEX PO) Take 15 mLs by mouth daily.   vitamin C (ASCORBIC ACID) 500 MG tablet Take 500 mg by mouth daily.   No facility-administered encounter medications on file as of 10/12/2021.    Review of Systems  Constitutional:  Negative for chills, fatigue and fever.  HENT:  Negative for congestion, rhinorrhea, sinus pressure, sinus pain, sneezing and sore throat.   Respiratory:  Negative for cough, chest tightness and  shortness of breath.   Neurological:  Negative for dizziness, light-headedness and headaches.   Immunization History  Administered Date(s) Administered   Fluad Quad(high Dose 65+) 10/06/2021   Influenza, High Dose Seasonal PF 07/29/2017, 08/30/2018, 08/14/2019   Influenza-Unspecified 08/12/2020   PFIZER(Purple Top)SARS-COV-2 Vaccination 11/20/2019, 12/15/2019, 09/09/2020   Pneumococcal Conjugate-13 09/09/2019   Pneumococcal Polysaccharide-23 11/22/2016   Zoster Recombinat (Shingrix) 10/22/2018   Pertinent  Health Maintenance Due  Topic Date Due   DEXA SCAN  Never done   INFLUENZA VACCINE  Completed   COLONOSCOPY (Pts 45-43yrs Insurance coverage will need to be confirmed)  Discontinued   Fall Risk 04/26/2021 05/19/2021 07/20/2021 10/06/2021 10/12/2021  Falls in the past year? 1 0 - 0 0  Was there an injury with Fall? 1 0 - 0 0  Fall Risk Category Calculator 3 0 - 0 0  Fall Risk Category High Low - Low Low  Patient Fall Risk Level Low fall risk Low fall risk Low fall risk Low fall risk Low fall risk  Patient at Risk for Falls Due to History of fall(s) No Fall Risks - No Fall Risks No Fall Risks  Fall risk Follow up Falls evaluation completed Falls evaluation completed - Falls evaluation completed Falls evaluation completed   Functional Status Survey:    There were no vitals filed for this visit. There is no height or weight on file to calculate BMI. Physical Exam Constitutional:      General: She is not in acute distress.    Appearance: She is not ill-appearing.  HENT:     Head: Normocephalic.     Right Ear: There is impacted cerumen.     Left Ear: There is impacted cerumen.     Nose: Nose normal. No congestion or rhinorrhea.     Mouth/Throat:     Mouth: Mucous membranes are moist.     Pharynx: No oropharyngeal exudate or posterior oropharyngeal erythema.  Eyes:     General: No scleral icterus.       Right eye: No discharge.        Left eye: No discharge.     Extraocular  Movements: Extraocular movements intact.     Conjunctiva/sclera: Conjunctivae normal.     Pupils: Pupils are equal, round, and reactive to light.  Cardiovascular:     Rate and Rhythm: Normal rate and regular rhythm.  Pulmonary:     Effort: Pulmonary effort is normal. No respiratory distress.     Breath sounds: Normal breath sounds. No wheezing, rhonchi or rales.  Chest:     Chest wall: No tenderness.  Musculoskeletal:     Cervical back: Normal range of motion. No rigidity or tenderness.  Lymphadenopathy:     Cervical: No cervical adenopathy.  Neurological:     Mental Status: She is alert. Mental status is at baseline.  Psychiatric:        Mood  and Affect: Mood normal.        Behavior: Behavior normal.    Labs reviewed: Recent Labs    08/04/21 0803 10/06/21 1016  NA 139 142  K 4.9 4.9  CL 108 107  CO2 26 27  GLUCOSE 91 78  BUN 23 26*  CREATININE 1.04* 1.13*  CALCIUM 9.4 9.4   Recent Labs    08/04/21 0803 10/06/21 1016  AST 13 15  ALT 8 8  BILITOT 0.4 0.4  PROT 6.4 7.2   Recent Labs    08/04/21 0803 10/06/21 1016  WBC 4.0 5.0  NEUTROABS 1,812 2,805  HGB 11.2* 12.3  HCT 34.2* 37.0  MCV 95.0 95.1  PLT 154 166   Lab Results  Component Value Date   TSH 2.94 10/06/2021   Lab Results  Component Value Date   HGBA1C 5.6 12/06/2018   Lab Results  Component Value Date   CHOL 172 10/06/2021   HDL 63 10/06/2021   LDLCALC 92 10/06/2021   TRIG 79 10/06/2021   CHOLHDL 2.7 10/06/2021    Significant Diagnostic Results in last 30 days:  No results found.  Assessment/Plan  Bilateral impacted cerumen Afebrile  ear cerumen lavaged with warm water and hydrogen peroxide moderate amounts of cerumen obtained using curette.Tolerated procedure well.TM clear without any signs of infection. - Ear Lavage    Family/ staff Communication: Reviewed plan of care with patient and daughter verbalized understanding   Labs/tests ordered: None   Next Appointment: Has  appointment   Sandrea Hughs, NP

## 2021-10-21 ENCOUNTER — Other Ambulatory Visit: Payer: Self-pay | Admitting: Family

## 2021-10-21 DIAGNOSIS — G8929 Other chronic pain: Secondary | ICD-10-CM

## 2021-10-21 DIAGNOSIS — M542 Cervicalgia: Secondary | ICD-10-CM

## 2021-10-21 DIAGNOSIS — M25552 Pain in left hip: Secondary | ICD-10-CM

## 2021-11-11 ENCOUNTER — Encounter: Payer: Self-pay | Admitting: Family

## 2021-11-11 ENCOUNTER — Other Ambulatory Visit: Payer: Self-pay

## 2021-11-11 ENCOUNTER — Ambulatory Visit (INDEPENDENT_AMBULATORY_CARE_PROVIDER_SITE_OTHER): Payer: PPO | Admitting: Family

## 2021-11-11 VITALS — BP 140/90 | HR 56 | Temp 96.5°F | Resp 16 | Ht 68.0 in | Wt 175.6 lb

## 2021-11-11 DIAGNOSIS — R350 Frequency of micturition: Secondary | ICD-10-CM

## 2021-11-11 LAB — POCT URINALYSIS DIPSTICK
Bilirubin, UA: NEGATIVE
Glucose, UA: NEGATIVE
Ketones, UA: POSITIVE
Nitrite, UA: NEGATIVE
Protein, UA: NEGATIVE
Spec Grav, UA: 1.025 (ref 1.010–1.025)
Urobilinogen, UA: NEGATIVE E.U./dL — AB
pH, UA: 5 (ref 5.0–8.0)

## 2021-11-11 NOTE — Patient Instructions (Signed)
Notify provider if symptoms worsen

## 2021-11-11 NOTE — Progress Notes (Signed)
Provider: [name]  Ayari Liwanag, [name], NP  Patient Care Team: Lorne Winkels, [name], NP as PCP - General (Family Medicine) [name], MD as Consulting Physician (Ophthalmology) Felecia Shelling, Nanine Means, MD (Neurology)  Extended Emergency Contact Information Primary Emergency Contact: [name] Address: [place] [address] [name] Work Phone: [phone] Mobile Phone: [phone] Relation: Daughter Secondary Emergency Contact: [name],[name]  [place] Phone: [phone] Relation: Grandson  Code Status:  Full Code  Goals of care: Advanced Directive information Advanced Directives 11/11/2021  Does Patient Have a Medical Advance Directive? Yes  Type of Paramedic of [place];Living will  Does patient want to make changes to medical advance directive? No - Patient declined  Copy of [place] in Chart? Yes - validated most recent copy scanned in chart (See row information)  Would patient like information on creating a medical advance directive? -     Chief Complaint  Patient presents with   Acute Visit    Patient daughter states patient is having urinary frequency x 2-3 weeks.     HPI:  Pt is a 76 y.o. female seen today for an acute visit for evaluation of increased urinary frequency x 2-3 weeks. denies any fever,chills,nausea,vomiting,abdominal pain,flank pain,urgency,dysuria,difficult urination or hematuria. Appetite good .drinks water with her meds but sometimes may forget. She continues to go to day program at [facility] during the week.      Past Medical History:  Diagnosis Date   ADHD    Arthritis    Cataracts, bilateral    Dementia ([place])    Depression    Glaucoma    Hypertension    Polymyalgia rheumatica ([place])    Past Surgical History:  Procedure Laterality Date   PARTIAL HIP ARTHROPLASTY Right 2011   REPLACEMENT TOTAL KNEE Left 2013   Dr  Jodell Cipro    No Known Allergies  Outpatient Encounter Medications as of 11/11/2021  Medication Sig   amLODipine (NORVASC) 10 MG tablet TAKE 1 TABLET BY MOUTH EVERY DAY   aspirin 325 MG tablet Take 325 mg by mouth as needed.    brimonidine-timolol (COMBIGAN) 0.2-0.5 % ophthalmic solution Place 1 drop into both eyes every 12 (twelve) hours.   buPROPion (WELLBUTRIN XL) 300 MG 24 hr tablet TAKE 1 TABLET BY MOUTH EVERY DAY   cetirizine (ZYRTEC) 10 MG tablet Take 10 mg by mouth daily.   Cholecalciferol (VITAMIN D) 125 MCG (5000 UT) CAPS Take 1 capsule by mouth daily.   Cranberry-Cholecalciferol 4200-500 MG-UNIT CAPS Take 1 capsule by mouth daily.   diclofenac Sodium (VOLTAREN) 1 % GEL Apply 2 g topically 4 (four) times daily.   donepezil (ARICEPT) 10 MG tablet Decrease from 10 mg tablet to 5 mg tablet daily x 1 week then stop.   doxazosin (CARDURA) 4 MG tablet TAKE 1 TABLET BY MOUTH EVERY DAY   gabapentin (NEURONTIN) 100 MG capsule TAKE 1 CAPSULE BY MOUTH AT BEDTIME.   Latanoprostene Bunod (VYZULTA) 0.024 % SOLN Apply to eye daily.   Loperamide HCl (IMODIUM PO) Take by mouth at bedtime.   meloxicam (MOBIC) 7.5 MG tablet TAKE 1 TABLET BY MOUTH EVERY DAY   memantine (NAMENDA) 10 MG tablet Take 1 tablet (10 mg total) by mouth 2 (two) times daily.   Omega-3 Fatty Acids (OMEGA 3 PO) Take 750 mg by mouth daily.   QUEtiapine (SEROQUEL) 25 MG tablet Take 0.5 tablets (12.5  mg total) by mouth at bedtime.   spironolactone (ALDACTONE) 25 MG tablet TAKE 1 TABLET BY MOUTH TWICE DAILY   Turmeric (QC TUMERIC COMPLEX PO) Take 15 mLs by mouth daily.   vitamin C (ASCORBIC ACID) 500 MG tablet Take 500 mg by mouth daily.   No facility-administered encounter medications on file as of 11/11/2021.    Review of Systems  Constitutional:  Negative for appetite change, chills, fatigue, fever and unexpected weight change.  Respiratory:  Negative for cough, chest tightness, shortness of breath and wheezing.    Cardiovascular:  Negative for chest pain, palpitations and leg swelling.  Gastrointestinal:  Negative for abdominal distention, abdominal pain, constipation, diarrhea, nausea and vomiting.  Genitourinary:  Positive for frequency. Negative for difficulty urinating, dysuria, flank pain and urgency.  Musculoskeletal:  Negative for arthralgias, back pain, gait problem, joint swelling, myalgias, neck pain and neck stiffness.  Skin:  Negative for color change, pallor and rash.  Neurological:  Negative for dizziness, weakness and headaches.  Psychiatric/Behavioral:  Negative for agitation, behavioral problems, confusion, hallucinations and sleep disturbance. The patient is not nervous/anxious.    Immunization History  Administered Date(s) Administered   Fluad Quad(high Dose 65+) 10/06/2021   Influenza, High Dose Seasonal PF 07/29/2017, 08/30/2018, 08/14/2019   Influenza-Unspecified 08/12/2020   PFIZER(Purple Top)SARS-COV-2 Vaccination 11/20/2019, 12/15/2019, 09/09/2020   Pneumococcal Conjugate-13 09/09/2019   Pneumococcal Polysaccharide-23 11/22/2016   Zoster Recombinat (Shingrix) 10/22/2018   Pertinent  Health Maintenance Due  Topic Date Due   DEXA SCAN  Never done   INFLUENZA VACCINE  Completed   COLONOSCOPY (Pts 45-25yrs Insurance coverage will need to be confirmed)  Discontinued   Fall Risk 05/19/2021 07/20/2021 10/06/2021 10/12/2021 11/11/2021  Falls in the past year? 0 - 0 0 0  Was there an injury with Fall? 0 - 0 0 0  Fall Risk Category Calculator 0 - 0 0 0  Fall Risk Category Low - Low Low Low  Patient Fall Risk Level Low fall risk Low fall risk Low fall risk Low fall risk Low fall risk  Patient at Risk for Falls Due to No Fall Risks - No Fall Risks No Fall Risks No Fall Risks  Fall risk Follow up Falls evaluation completed - Falls evaluation completed Falls evaluation completed Falls evaluation completed   Functional Status Survey:    Vitals:   11/11/21 0842  BP: 140/90  Pulse:  (!) 56  Resp: 16  Temp: (!) 96.5 F (35.8 C)  SpO2: 97%  Weight: 175 lb 9.6 oz (79.7 kg)  Height: 5\' 8"  (1.727 m)   Body mass index is 26.7 kg/m. Physical Exam Vitals reviewed.  Constitutional:      General: She is not in acute distress.    Appearance: Normal appearance. She is normal weight. She is not ill-appearing or diaphoretic.  HENT:     Head: Normocephalic.  Eyes:     General: No scleral icterus.       Right eye: No discharge.        Left eye: No discharge.     Conjunctiva/sclera: Conjunctivae normal.     Pupils: Pupils are equal, round, and reactive to light.  Cardiovascular:     Rate and Rhythm: Normal rate and regular rhythm.     Pulses: Normal pulses.     Heart sounds: Normal heart sounds. No murmur heard.   No friction rub. No gallop.  Pulmonary:     Effort: Pulmonary effort is normal. No respiratory distress.     Breath sounds:  Normal breath sounds. No wheezing, rhonchi or rales.  Chest:     Chest wall: No tenderness.  Abdominal:     General: Bowel sounds are normal. There is no distension.     Palpations: Abdomen is soft. There is no mass.     Tenderness: There is abdominal tenderness in the suprapubic area. There is no right CVA tenderness, left CVA tenderness, guarding or rebound.  Skin:    General: Skin is warm and dry.     Coloration: Skin is not pale.     Findings: No erythema or rash.  Neurological:     Mental Status: She is alert and oriented to person, place, and time.     Cranial Nerves: No cranial nerve deficit.     Sensory: No sensory deficit.     Motor: No weakness.     Coordination: Coordination normal.     Gait: Gait normal.  Psychiatric:        Mood and Affect: Mood normal.        Speech: Speech normal.        Behavior: Behavior normal.        Thought Content: Thought content normal.        Judgment: Judgment normal.    Labs reviewed: Recent Labs    08/04/21 0803 10/06/21 1016  NA 139 142  K 4.9 4.9  CL 108 107  CO2 26 27   GLUCOSE 91 78  BUN 23 26*  CREATININE 1.04* 1.13*  CALCIUM 9.4 9.4   Recent Labs    08/04/21 0803 10/06/21 1016  AST 13 15  ALT 8 8  BILITOT 0.4 0.4  PROT 6.4 7.2   Recent Labs    08/04/21 0803 10/06/21 1016  WBC 4.0 5.0  NEUTROABS 1,812 2,805  HGB 11.2* 12.3  HCT 34.2* 37.0  MCV 95.0 95.1  PLT 154 166   Lab Results  Component Value Date   TSH 2.94 10/06/2021   Lab Results  Component Value Date   HGBA1C 5.6 12/06/2018   Lab Results  Component Value Date   CHOL 172 10/06/2021   HDL 63 10/06/2021   LDLCALC 92 10/06/2021   TRIG 79 10/06/2021   CHOLHDL 2.7 10/06/2021    Significant Diagnostic Results in last 30 days:  No results found.  Assessment/Plan  Urinary frequency Afebrile Suprapubic discomfort on exam but no bladder distension. Discussed option to wait for final urine culture then treat with antibiotics if indicated .daughter prefers to wait for final urine cultures for now.will notify provider if symptoms worsen.  - POC Urinalysis Dipstick - Urine Culture  Family/ staff Communication: Reviewed plan of care with patient and daughter verbalized understanding   Labs/tests ordered:  - Urine Culture; Future - POC Urinalysis Dipstick  Next Appointment:As needed if symptoms worsen or fail to improve     Sandrea Hughs, NP

## 2021-11-12 LAB — URINE CULTURE
MICRO NUMBER:: 12961673
SPECIMEN QUALITY:: ADEQUATE

## 2021-11-14 ENCOUNTER — Other Ambulatory Visit: Payer: Self-pay

## 2021-11-14 DIAGNOSIS — R350 Frequency of micturition: Secondary | ICD-10-CM

## 2021-12-05 ENCOUNTER — Encounter: Payer: Self-pay | Admitting: Family Medicine

## 2021-12-07 ENCOUNTER — Other Ambulatory Visit: Payer: Self-pay | Admitting: Family

## 2021-12-07 DIAGNOSIS — I10 Essential (primary) hypertension: Secondary | ICD-10-CM

## 2022-01-25 ENCOUNTER — Ambulatory Visit (INDEPENDENT_AMBULATORY_CARE_PROVIDER_SITE_OTHER): Payer: PPO | Admitting: Family

## 2022-01-25 ENCOUNTER — Encounter: Payer: Self-pay | Admitting: Family

## 2022-01-25 VITALS — BP 130/70 | HR 74 | Temp 97.5°F | Ht 68.0 in | Wt 178.2 lb

## 2022-01-25 DIAGNOSIS — R300 Dysuria: Secondary | ICD-10-CM | POA: Diagnosis not present

## 2022-01-25 DIAGNOSIS — R2681 Unsteadiness on feet: Secondary | ICD-10-CM

## 2022-01-25 LAB — POCT URINALYSIS DIPSTICK
Bilirubin, UA: NEGATIVE
Blood, UA: NEGATIVE
Glucose, UA: NEGATIVE
Ketones, UA: NEGATIVE
Leukocytes, UA: NEGATIVE
Nitrite, UA: NEGATIVE
Protein, UA: NEGATIVE
Spec Grav, UA: 1.02 (ref 1.010–1.025)
Urobilinogen, UA: 0.2 E.U./dL
pH, UA: 6 (ref 5.0–8.0)

## 2022-01-26 LAB — URINE CULTURE
MICRO NUMBER:: 13285158
Result:: NO GROWTH
SPECIMEN QUALITY:: ADEQUATE

## 2022-01-29 NOTE — Progress Notes (Signed)
? ?Provider: [name] ? ?Trinidi Toppins, [name], NP ? ?Patient Care Team: ?Taesean Reth, [name], NP as PCP - General (Family Medicine) ?[name], MD as Consulting Physician (Ophthalmology) ?Sater, Nanine Means, MD (Neurology) ? ?Extended Emergency Contact Information ?Primary Emergency Contact: [name] ?Address: [address] ?         [place] [address] [place] ?Work Phone: [phone] ?Mobile Phone: [phone] ?Relation: Daughter ?Secondary Emergency Contact: [name],[name] ? [place] of [place] ?Home Phone: [phone] ?Relation: Grandson ? ?Code Status: Full Code  ?Goals of care: Advanced Directive information ? ?  11/11/2021  ?  8:06 AM  ?Advanced Directives  ?Does Patient Have a Medical Advance Directive? Yes  ?Type of Paramedic of [place];Living will  ?Does patient want to make changes to medical advance directive? No - Patient declined  ?Copy of [place] in Chart? Yes - validated most recent copy scanned in chart (See row information)  ? ? ? ?Chief Complaint  ?Patient presents with  ? Acute Visit  ?  Patient complains of burning with urination. Symptoms for about a day. Patient has increased confusion and acting out. Patient has urinary urgency and frequency.  ? ? ?HPI:  ?Pt is a 76 y.o. female seen today for an acute visit for evaluation of burning with urination x1 day.  She is here with the daughter who states has had increased confusion and acting out.  Also has urinary urgency and frequency.denies any fever,chills,nausea,vomiting,abdominal pain,flank pain,dysuria,difficult urination or hematuria. ?Daughter also states patient currently in handicap accessible Apartment but has no grab bars in the bathroom.Requests letter to be written for necessity for the apartment on her to place grab bars in the bathroom. ? ? ?Past Medical History:  ?Diagnosis Date  ? ADHD   ? Arthritis   ? Cataracts, bilateral   ? Dementia ([place])   ?  Depression   ? Glaucoma   ? Hypertension   ? Polymyalgia rheumatica ([place])   ? ?Past Surgical History:  ?Procedure Laterality Date  ? PARTIAL HIP ARTHROPLASTY Right 2011  ? REPLACEMENT TOTAL KNEE Left 2013  ? Dr Jodell Cipro  ? ? ?No Known Allergies ? ?Outpatient Encounter Medications as of 01/25/2022  ?Medication Sig  ? amLODipine (NORVASC) 10 MG tablet TAKE 1 TABLET BY MOUTH EVERY DAY  ? aspirin 325 MG tablet Take 325 mg by mouth as needed.   ? brimonidine-timolol (COMBIGAN) 0.2-0.5 % ophthalmic solution Place 1 drop into both eyes every 12 (twelve) hours.  ? buPROPion (WELLBUTRIN XL) 300 MG 24 hr tablet TAKE 1 TABLET BY MOUTH EVERY DAY  ? cetirizine (ZYRTEC) 10 MG tablet Take 10 mg by mouth daily.  ? Cholecalciferol (VITAMIN D) 125 MCG (5000 UT) CAPS Take 1 capsule by mouth daily.  ? Cranberry-Cholecalciferol 4200-500 MG-UNIT CAPS Take 1 capsule by mouth daily.  ? diclofenac Sodium (VOLTAREN) 1 % GEL Apply 2 g topically 4 (four) times daily.  ? donepezil (ARICEPT) 10 MG tablet Decrease from 10 mg tablet to 5 mg tablet daily x 1 week then stop.  ? doxazosin (CARDURA) 4 MG tablet TAKE 1 TABLET BY MOUTH EVERY DAY  ? gabapentin (NEURONTIN) 100 MG capsule TAKE 1 CAPSULE BY MOUTH AT BEDTIME.  ? Latanoprostene Bunod (VYZULTA) 0.024 % SOLN Apply to eye daily.  ? Loperamide HCl (IMODIUM PO) Take by mouth at bedtime.  ? meloxicam (MOBIC) 7.5 MG tablet TAKE 1 TABLET BY MOUTH EVERY DAY  ? memantine (NAMENDA) 10 MG  tablet Take 1 tablet (10 mg total) by mouth 2 (two) times daily.  ? Omega-3 Fatty Acids (OMEGA 3 PO) Take 750 mg by mouth daily.  ? QUEtiapine (SEROQUEL) 25 MG tablet Take 0.5 tablets (12.5 mg total) by mouth at bedtime.  ? spironolactone (ALDACTONE) 25 MG tablet TAKE 1 TABLET BY MOUTH TWICE DAILY  ? Turmeric (QC TUMERIC COMPLEX PO) Take 15 mLs by mouth daily.  ? vitamin C (ASCORBIC ACID) 500 MG tablet Take 500 mg by mouth daily.  ? ?No facility-administered encounter medications on file as of 01/25/2022.  ? ? ?Review of  Systems  ?Constitutional:  Negative for appetite change, chills, fatigue, fever and unexpected weight change.  ?Eyes:  Negative for pain, discharge, redness, itching and visual disturbance.  ?Respiratory:  Negative for cough, chest tightness, shortness of breath and wheezing.   ?Cardiovascular:  Negative for chest pain, palpitations and leg swelling.  ?Gastrointestinal:  Negative for abdominal distention, abdominal pain, blood in stool, constipation, diarrhea, nausea and vomiting.  ?Genitourinary:  Positive for dysuria, frequency and urgency. Negative for difficulty urinating and flank pain.  ?Skin:  Negative for color change, pallor and rash.  ?Neurological:  Negative for dizziness, weakness, light-headedness, numbness and headaches.  ?Psychiatric/Behavioral:  Positive for behavioral problems and confusion. Negative for agitation, hallucinations and sleep disturbance. The patient is not nervous/anxious.   ? ?Immunization History  ?Administered Date(s) Administered  ? Fluad Quad(high Dose 65+) 10/06/2021  ? Influenza, High Dose Seasonal PF 07/29/2017, 08/30/2018, 08/14/2019  ? Influenza-Unspecified 08/12/2020  ? PFIZER(Purple Top)SARS-COV-2 Vaccination 11/20/2019, 12/15/2019, 09/09/2020  ? Pneumococcal Conjugate-13 09/09/2019  ? Pneumococcal Polysaccharide-23 11/22/2016  ? Zoster Recombinat (Shingrix) 10/22/2018  ? ?Pertinent  Health Maintenance Due  ?Topic Date Due  ? DEXA SCAN  Never done  ? INFLUENZA VACCINE  05/09/2022  ? COLONOSCOPY (Pts 45-80yr Insurance coverage will need to be confirmed)  Discontinued  ? ? ?  07/20/2021  ?  9:59 AM 10/06/2021  ?  9:09 AM 10/12/2021  ?  8:28 AM 11/11/2021  ?  8:06 AM 01/25/2022  ?  2:45 PM  ?Fall Risk  ?Falls in the past year?  0 0 0 0  ?Was there an injury with Fall?  0 0 0 0  ?Fall Risk Category Calculator  0 0 0 0  ?Fall Risk Category  Low Low Low Low  ?Patient Fall Risk Level Low fall risk Low fall risk Low fall risk Low fall risk Low fall risk  ?Patient at Risk for Falls Due  to  No Fall Risks No Fall Risks No Fall Risks No Fall Risks  ?Fall risk Follow up  Falls evaluation completed Falls evaluation completed Falls evaluation completed Falls evaluation completed  ? ?Functional Status Survey: ?  ? ?Vitals:  ? 01/25/22 1445  ?BP: 130/70  ?Pulse: 74  ?Temp: (!) 97.5 ?F (36.4 ?C)  ?SpO2: 94%  ?Weight: 178 lb 3.2 oz (80.8 kg)  ?Height: '5\' 8"'$  (1.727 m)  ? ?Body mass index is 27.1 kg/m?.Marland Kitchen?Physical Exam ?Vitals reviewed.  ?Constitutional:   ?   General: She is not in acute distress. ?   Appearance: Normal appearance. She is normal weight. She is not ill-appearing or diaphoretic.  ?Eyes:  ?   General: No scleral icterus.    ?   Right eye: No discharge.     ?   Left eye: No discharge.  ?   Conjunctiva/sclera: Conjunctivae normal.  ?   Pupils: Pupils are equal, round, and reactive to light.  ?Cardiovascular:  ?  Rate and Rhythm: Normal rate and regular rhythm.  ?   Pulses: Normal pulses.  ?   Heart sounds: Normal heart sounds. No murmur heard. ?  No friction rub. No gallop.  ?Pulmonary:  ?   Effort: Pulmonary effort is normal. No respiratory distress.  ?   Breath sounds: Normal breath sounds. No wheezing, rhonchi or rales.  ?Chest:  ?   Chest wall: No tenderness.  ?Abdominal:  ?   General: Bowel sounds are normal. There is no distension.  ?   Palpations: Abdomen is soft. There is no mass.  ?   Tenderness: There is abdominal tenderness in the suprapubic area. There is no right CVA tenderness, left CVA tenderness, guarding or rebound.  ?Skin: ?   General: Skin is warm and dry.  ?   Coloration: Skin is not pale.  ?   Findings: No erythema or rash.  ?Neurological:  ?   Mental Status: She is alert and oriented to person, place, and time.  ?   Motor: No weakness.  ?   Gait: Gait normal.  ?Psychiatric:     ?   Mood and Affect: Mood normal.     ?   Speech: Speech normal.     ?   Behavior: Behavior normal.  ? ? ?Labs reviewed: ?Recent Labs  ?  08/04/21 ?0803 10/06/21 ?1016  ?NA 139 142  ?K 4.9 4.9  ?CL  108 107  ?CO2 26 27  ?GLUCOSE 91 78  ?BUN 23 26*  ?CREATININE 1.04* 1.13*  ?CALCIUM 9.4 9.4  ? ?Recent Labs  ?  08/04/21 ?0803 10/06/21 ?1016  ?AST 13 15  ?ALT 8 8  ?BILITOT 0.4 0.4  ?PROT 6.4 7.2  ? ?

## 2022-01-30 ENCOUNTER — Other Ambulatory Visit: Payer: Self-pay | Admitting: Family

## 2022-01-30 ENCOUNTER — Other Ambulatory Visit: Payer: Self-pay | Admitting: Orthopedic Surgery

## 2022-01-30 DIAGNOSIS — F331 Major depressive disorder, recurrent, moderate: Secondary | ICD-10-CM

## 2022-01-30 DIAGNOSIS — I10 Essential (primary) hypertension: Secondary | ICD-10-CM

## 2022-03-03 ENCOUNTER — Other Ambulatory Visit: Payer: Self-pay | Admitting: Family

## 2022-03-03 ENCOUNTER — Other Ambulatory Visit: Payer: Self-pay | Admitting: Orthopedic Surgery

## 2022-03-03 DIAGNOSIS — I10 Essential (primary) hypertension: Secondary | ICD-10-CM

## 2022-03-03 DIAGNOSIS — F331 Major depressive disorder, recurrent, moderate: Secondary | ICD-10-CM

## 2022-03-03 NOTE — Telephone Encounter (Signed)
Patient has request refill on medication Doxazosin. Patient medication last refilled in November 2023, 2022. Patient medication has warnings. Medication pend and sent to PCP [name], [name], NP for approval.

## 2022-03-08 ENCOUNTER — Ambulatory Visit: Payer: PPO | Admitting: Family Medicine

## 2022-03-08 ENCOUNTER — Encounter: Payer: Self-pay | Admitting: Family Medicine

## 2022-03-08 VITALS — BP 112/58 | HR 50 | Ht 68.0 in | Wt 176.5 lb

## 2022-03-08 DIAGNOSIS — R451 Restlessness and agitation: Secondary | ICD-10-CM

## 2022-03-08 DIAGNOSIS — G301 Alzheimer's disease with late onset: Secondary | ICD-10-CM | POA: Diagnosis not present

## 2022-03-08 DIAGNOSIS — F0280 Dementia in other diseases classified elsewhere without behavioral disturbance: Secondary | ICD-10-CM

## 2022-03-08 DIAGNOSIS — R443 Hallucinations, unspecified: Secondary | ICD-10-CM

## 2022-03-08 MED ORDER — QUETIAPINE FUMARATE 25 MG PO TABS: 12.5000 mg | ORAL_TABLET | Freq: Two times a day (BID) | ORAL | 1 refills | Status: DC

## 2022-03-08 NOTE — Patient Instructions (Signed)
Below is our plan:  We will continue donepezil '10mg'$  daily and memantine '10mg'$  twice daily. Continue quetiapine 12.'5mg'$  daily. May add second 12.'5mg'$  dose as needed. Monitor blood pressure and pulse closely. I am going to send a referral to psychiatry to help [place] manage mood.   HOLD QUETIAPINE if blood pressure is lower than 120/60.   Please make sure you are staying well hydrated. I recommend 50-60 ounces daily. Well balanced diet and regular exercise encouraged. Consistent sleep schedule with 6-8 hours recommended.   Please continue follow up with care team as directed.   Follow up with me in 6 months   You may receive a survey regarding today's visit. I encourage you to leave honest feed back as I do use this information to improve patient care. Thank you for seeing me today!   Management of Memory Problems   There are some general things you can do to help manage your memory problems.  Your memory may not in fact recover, but by using techniques and strategies you will be able to manage your memory difficulties better.   1)  Establish a routine. Try to establish and then stick to a regular routine.  By doing this, you will get used to what to expect and you will reduce the need to rely on your memory.  Also, try to do things at the same time of day, such as taking your medication or checking your calendar first thing in the morning. Think about think that you can do as a part of a regular routine and make a list.  Then enter them into a daily planner to remind you.  This will help you establish a routine.   2)  Organize your environment. Organize your environment so that it is uncluttered.  Decrease visual stimulation.  Place everyday items such as keys or cell phone in the same place every day (ie.  Basket next to front door) Use post it notes with a brief message to yourself (ie. Turn off light, lock the door) Use labels to indicate where things go (ie. Which cupboards are for food, dishes,  etc.) Keep a notepad and pen by the telephone to take messages   3)  Memory Aids A diary or journal/notebook/daily planner Making a list (shopping list, chore list, to do list that needs to be done) Using an alarm as a reminder (kitchen timer or cell phone alarm) Using cell phone to store information (Notes, Calendar, Reminders) Calendar/White board placed in a prominent position Post-it notes   In order for memory aids to be useful, you need to have good habits.  It's no good remembering to make a note in your journal if you don't remember to look in it.  Try setting aside a certain time of day to look in journal.   4)  Improving mood and managing fatigue. There may be other factors that contribute to memory difficulties.  Factors, such as anxiety, depression and tiredness can affect memory. Regular gentle exercise can help improve your mood and give you more energy. Simple relaxation techniques may help relieve symptoms of anxiety Try to get back to completing activities or hobbies you enjoyed doing in the past. Learn to pace yourself through activities to decrease fatigue. Find out about some local support groups where you can share experiences with others. Try and achieve 7-8 hours of sleep at night.

## 2022-03-08 NOTE — Progress Notes (Signed)
Chief Complaint  Patient presents with   Follow-up    Rm 10, w daughter Pam Knapp. Here to f/u for AD. Pts daughter reports worsening in memory. More aggegation and not recognizing she home and safe. MMSE:15    HISTORY OF PRESENT ILLNESS:  03/08/2022 ALL: Pam Knapp returns for follow up for AD. She presents Pam Knapp (daughter), who aids in history. She continues donepezil '10mg'$  QD and memantine '10mg'$  BID. Memory continues to decline. She is needing assistance with all ADL's. She seems to be eating well. Sleeping well. No recent falls. She does continue to have periods of agitation and restlessness. No particular rhyme or reason. She seems more confused on the weekends. She continues to attend [facility] during the week. She has had some visual hallucinations and continues to have delusional thoughts. Quetiapine 12.'5mg'$  seems to help some with aggression. Pam Knapp has given an extra 12.'5mg'$  dose on days where she seems more agitated and reports Pam Knapp has tolerated well. She also continues bupropion XL '300mg'$  daily managed by PCP. She does not routinely check BP or pulse at home but does have a automatic cuff. She denies dizziness, lightheadedness or worsening mood after taking medications.   09/05/2021 ALL: Pam Knapp returns for follow up for AD. She continues donepezil and memantine. Daughter reports they did not hear back from [facility] but they were able to get her in with [facility]. She is doing more exercise with [facility] and walks with her neighbor on the weekends. She needs more hands on help with ADLs. She is able to feed herself. Appetite is good. Sleep is broken. Bedtime is typically 7-8pm. She wakes up around 6 most mornings. She may wake up 1-2 times at night. She continues to have intermittent visual hallucinations, mostly at night. Daughter is living with her now. She had a near fall back a few months ago. She reported being dizzy. UC evaluation was unremarkable. No further  episodes. She admits that she does not always drink water.   03/08/2021 ALL:  Pam Knapp returns for follow up for AD. We continued donepezil '10mg'$  daily and increased memantine to '10mg'$  BID at last visit 09/2020. She is tolerating medications. She continues to decline. She lives with grandson. Daughter in [place]. She is having more periods on confusion. She needs direction with ADLs. She is not sleeping as well. She is having more visual hallucinations, recently. Daughter reports that she will tell them someone was in the home when they know she was alone. Maybe delusions? No auditory hallucinations. She is eating normally. No accidents. No recent falls but does continue to use her cane. Daughter is requesting help at home. Grandson works and has a hard time helping with ADLs.   09/06/2020 ALL:  Pam Knapp is a 76 y.o. female here today for follow up for AD.  Compliance report she reports that she is doing fairly well today.  She presents with her daughter who aids in history.  She has continued Aricept 10 mg daily and Namenda 5 mg twice daily.  She is tolerating medications without any obvious adverse effects.  She feels that she has good days and bad days.  She continues to live alone.  Her grandson checks on her daily.  He assist her with medications.  Her daughter face times every day to ensure that she has taken her medications.  She is able to perform ADLs independently.  She does not drive.  She is walking for 30 minutes every day with her neighbor.  She uses a cane.  No falls.   HISTORY (copied from previous note)  Pam Knapp is a 76 y.o. female here today for follow up for dementia. She is feels that she is doing well. She feels good and is staying active. She has tolerated increased dose of donepezil. She has continues bupropion as well. Her daughter feels that these medications have been helpful. She lives alone. She is able perform ADL's independently. She is eating  normally. She drinks plenty of fluids. Her grandson assists her with medication. She does not drive. Her daughter lives out of state. They face time regularly. PCP placed referral for home health eval. She exercises regularly. No falls. She does use a cane for long distances.     REVIEW OF SYSTEMS: Out of a complete 14 system review of symptoms, the patient complains only of the following symptoms, memory loss, vision impairment , visual hallucinations, insomnia and all other reviewed systems are negative.   ALLERGIES: No Known Allergies   HOME MEDICATIONS: Outpatient Medications Prior to Visit  Medication Sig Dispense Refill   amLODipine (NORVASC) 10 MG tablet TAKE 1 TABLET BY MOUTH EVERY DAY 90 tablet 1   aspirin 325 MG tablet Take 325 mg by mouth as needed.      brimonidine-timolol (COMBIGAN) 0.2-0.5 % ophthalmic solution Place 1 drop into both eyes every 12 (twelve) hours.     buPROPion (WELLBUTRIN XL) 300 MG 24 hr tablet TAKE 1 TABLET BY MOUTH EVERY DAY 90 tablet 1   cetirizine (ZYRTEC) 10 MG tablet Take 10 mg by mouth daily.     Cholecalciferol (VITAMIN D) 125 MCG (5000 UT) CAPS Take 1 capsule by mouth daily.     Cranberry-Cholecalciferol 4200-500 MG-UNIT CAPS Take 1 capsule by mouth daily.     diclofenac Sodium (VOLTAREN) 1 % GEL Apply 2 g topically 4 (four) times daily. 100 g 3   donepezil (ARICEPT) 10 MG tablet Decrease from 10 mg tablet to 5 mg tablet daily x 1 week then stop. 90 tablet 3   doxazosin (CARDURA) 4 MG tablet TAKE 1 TABLET BY MOUTH EVERY DAY 90 tablet 1   gabapentin (NEURONTIN) 100 MG capsule TAKE 1 CAPSULE BY MOUTH AT BEDTIME. 90 capsule 3   Latanoprostene Bunod (VYZULTA) 0.024 % SOLN Apply to eye daily.     Loperamide HCl (IMODIUM PO) Take by mouth at bedtime.     meloxicam (MOBIC) 7.5 MG tablet TAKE 1 TABLET BY MOUTH EVERY DAY 30 tablet 6   memantine (NAMENDA) 10 MG tablet Take 1 tablet (10 mg total) by mouth 2 (two) times daily. 180 tablet 3   Omega-3 Fatty  Acids (OMEGA 3 PO) Take 750 mg by mouth daily.     spironolactone (ALDACTONE) 25 MG tablet TAKE 1 TABLET BY MOUTH TWICE A DAY 180 tablet 1   Turmeric (QC TUMERIC COMPLEX PO) Take 15 mLs by mouth daily.     vitamin C (ASCORBIC ACID) 500 MG tablet Take 500 mg by mouth daily.     QUEtiapine (SEROQUEL) 25 MG tablet Take 0.5 tablets (12.5 mg total) by mouth at bedtime. 45 tablet 1   No facility-administered medications prior to visit.     PAST MEDICAL HISTORY: Past Medical History:  Diagnosis Date   ADHD    Arthritis    Cataracts, bilateral    Dementia ([place])    Depression    Glaucoma    Hypertension    Polymyalgia rheumatica ([place])      PAST SURGICAL HISTORY:  Past Surgical History:  Procedure Laterality Date   PARTIAL HIP ARTHROPLASTY Right 2011   REPLACEMENT TOTAL KNEE Left 2013   Dr Jodell Cipro     FAMILY HISTORY: Family History  Problem Relation Age of Onset   Diabetes Mother    Kidney failure Mother    High blood pressure Sister    Neuropathy Sister    Heart disease Brother    Asthma Sister    Sleep apnea Sister    Bipolar disorder Sister    Diabetes Sister    Diabetes Sister    Breast cancer Sister    Fibromyalgia Daughter    Colon cancer Other    Stomach cancer Other    Rectal cancer Other    Esophageal cancer Other      SOCIAL HISTORY: Social History   Socioeconomic History   Marital status: Widowed    Spouse name: Not on file   Number of children: 1   Years of education: BS   Highest education level: Not on file  Occupational History   Occupation: Retired  Tobacco Use   Smoking status: Former    Packs/day: 0.50    Years: 20.00    Pack years: 10.00    Types: Cigarettes   Smokeless tobacco: Never   Tobacco comments:    quit over 20 years ago  Vaping Use   Vaping Use: Never used  Substance and Sexual Activity   Alcohol use: Not Currently   Drug use: Never   Sexual activity: Not on file  Other Topics Concern   Not on file  Social History  Narrative   Lives alone   Caffeine use: 2 coffees per day   Right handed       Social History      Diet? Regular diet       Do you drink/eat things with caffeine? Yes coffee      Marital status?                widow                    What year were you married? [place] you live in a house, apartment, assisted living, condo, trailer, etc.? Apartment       Is it one or more stories? one      How many persons live in your home? one      Do you have any pets in your home? (please list) cockatiel       Highest level of education completed? 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    yes                                  Type & how often? Daily chair aerobics - water aerobics       Advanced Directives      Do you have a living will? No       Do you have a DNR form?            no                      If not, do you want to discuss one? yes      Do you have signed POA/HPOA for forms? No  Functional Status      Do you have difficulty bathing or dressing yourself? No       Do you have difficulty preparing food or eating? No       Do you have difficulty managing your medications? yes      Do you have difficulty managing your finances? Yes daughter manages       Do you have difficulty affording your medications? no      Social Determinants of Health   Financial Resource Strain: Not on file  Food Insecurity: Not on file  Transportation Needs: Not on file  Physical Activity: Not on file  Stress: Not on file  Social Connections: Not on file  Intimate Partner Violence: Not on file      PHYSICAL EXAM  Vitals:   03/08/22 0955 03/08/22 1031  BP: (!) 112/50 (!) 112/58  Pulse: (!) 49 (!) 50  Weight: 176 lb 8 oz (80.1 kg)   Height: '5\' 8"'$  (1.727 m)      Body mass index is 26.84 kg/m.   Generalized: Well developed, in no acute distress  Cardiology: normal rate and rhythm, no murmur auscultated  Respiratory:  clear to auscultation bilaterally    Neurological examination  Mentation: Alert not oriented to time, place, or history taking. Repetitive in question asking. Follows some commands but needs continued guidance and instruction,  speech and language fluent Cranial nerve II-XII: Pupils were equal round reactive to light. Extraocular movements were full, visual field were full on confrontational test. Facial sensation and strength were normal. Head turning and shoulder shrug  were normal and symmetric. Motor: The motor testing reveals 5 over 5 strength of all 4 extremities. Good symmetric motor tone is noted throughout.  Sensory: Sensory testing is intact to soft touch on all 4 extremities. No evidence of extinction is noted.  Coordination: Cerebellar testing reveals reduced (does not understand instructions) finger-nose-finger and good heel-to-shin bilaterally.  Gait and station: Gait is stable with cane    DIAGNOSTIC DATA (LABS, IMAGING, TESTING) - I reviewed patient records, labs, notes, testing and imaging myself where available.  Lab Results  Component Value Date   WBC 5.0 10/06/2021   HGB 12.3 10/06/2021   HCT 37.0 10/06/2021   MCV 95.1 10/06/2021   PLT 166 10/06/2021      Component Value Date/Time   NA 142 10/06/2021 1016   K 4.9 10/06/2021 1016   CL 107 10/06/2021 1016   CO2 27 10/06/2021 1016   GLUCOSE 78 10/06/2021 1016   BUN 26 (H) 10/06/2021 1016   CREATININE 1.13 (H) 10/06/2021 1016   CALCIUM 9.4 10/06/2021 1016   PROT 7.2 10/06/2021 1016   ALBUMIN 3.9 11/17/2009 1457   AST 15 10/06/2021 1016   ALT 8 10/06/2021 1016   ALKPHOS 77 11/17/2009 1457   BILITOT 0.4 10/06/2021 1016   GFRNONAA 66 11/18/2019 0852   GFRAA 76 11/18/2019 0852   Lab Results  Component Value Date   CHOL 172 10/06/2021   HDL 63 10/06/2021   LDLCALC 92 10/06/2021   TRIG 79 10/06/2021   CHOLHDL 2.7 10/06/2021   Lab Results  Component Value Date   HGBA1C 5.6 12/06/2018   Lab Results   Component Value Date   ZSWFUXNA35 573 11/18/2018   Lab Results  Component Value Date   TSH 2.94 10/06/2021      03/08/2022   10:03 AM 09/05/2021   10:27 AM 03/08/2021   10:05 AM  MMSE - Mini Mental State Exam  Orientation  to time 1 0 1  Orientation to Place '4 4 3  '$ Registration '3 3 3  '$ Attention/ Calculation 0 0 0  Recall 0 0 0  Language- name 2 objects '2 2 2  '$ Language- repeat 1 0 1  Language- follow 3 step command '3 3 1  '$ Language- read & follow direction '1 1 1  '$ Write a sentence 0 0 1  Write a sentence-comments  UTO   Copy design 0 0 0  Copy design-comments  UTO   Total score '15 13 13       '$ 03/03/2020   10:22 AM 09/03/2019    5:46 PM 09/03/2019    3:23 PM 11/18/2018   11:33 AM  Montreal Cognitive Assessment   Visuospatial/ Executive (0/5) '3 1 1 2  '$ Naming (0/3) '2 2 2 2  '$ Attention: Read list of digits (0/2) '1 1 1 2  '$ Attention: Read list of letters (0/1) '1 1 1 1  '$ Attention: Serial 7 subtraction starting at 100 (0/3) '1 1 1 1  '$ Language: Repeat phrase (0/2) '2 1 1 1  '$ Language : Fluency (0/1) 1 0 0 0  Abstraction (0/2) '1 2 2 2  '$ Delayed Recall (0/5) 0 0 0 0  Orientation (0/6) '4 4 4 5  '$ Total '16 13 13 16  '$ Adjusted Score (based on education)   62 17      ASSESSMENT AND PLAN  76 y.o. year old female  has a past medical history of ADHD, Arthritis, Cataracts, bilateral, Dementia ([place]), Depression, Glaucoma, Hypertension, and Polymyalgia rheumatica ([place]). here with   Late onset Alzheimer's disease without behavioral disturbance ([place]) - Plan: Ambulatory referral to Psychiatry  Agitation - Plan: Ambulatory referral to Psychiatry  Hallucination - Plan: Ambulatory referral to Psychiatry  Pam Knapp is doing well, today. Memoy seems stable. MMSE performed today 15/30.  Previously 13/30. She has tolerated Aricept and Namenda. We will continue Arcipet '10mg'$  daily and Namenda '10mg'$  BID. Heart rate remains low but but appears asymptomatic. BP has been 120-140/70-80 but 112/58, today. She  is aware to montior her blood pressure and pulse closely at home. She may continue quetiapine 12.'5mg'$  daily at bedtime to help with interrupted sleep and hallucinations. May give second 12.'5mg'$  dose sparingly as needed. She was advised on possible side effects and will call with any concerns. I will also place a referral to psychiatry to assist with medication management. She will continue participation with [facility]. She will continue healthy lifestyle habits. Safety precautions discussed with daughter. She will follow up with me in 6 months. She and her daughter verbalize understanding and agreement with this plan.   Orders Placed This Encounter  Procedures   Ambulatory referral to Psychiatry    Referral Priority:   Routine    Referral Type:   Psychiatric    Referral Reason:   Specialty Services Required    Requested Specialty:   Psychiatry    Number of Visits Requested:   1      I spent 30 minutes of face-to-face and non-face-to-face time with patient.  This included previsit chart review, lab review, study review, order entry, electronic health record documentation, patient education.   Debbora Presto, MSN, FNP-C 03/08/2022, 10:55 AM  [facility] [address], [place], [place] [address] [phone]

## 2022-03-16 ENCOUNTER — Ambulatory Visit
Admission: RE | Admit: 2022-03-16 | Discharge: 2022-03-16 | Disposition: A | Discharge status: Home / Self Care | Payer: PPO | Source: Ambulatory Visit | Attending: Family | Admitting: Family

## 2022-03-16 ENCOUNTER — Telehealth: Payer: Self-pay | Admitting: Family Medicine

## 2022-03-16 ENCOUNTER — Ambulatory Visit (INDEPENDENT_AMBULATORY_CARE_PROVIDER_SITE_OTHER): Payer: PPO | Admitting: Nurse Practitioner

## 2022-03-16 ENCOUNTER — Encounter: Payer: Self-pay | Admitting: Nurse Practitioner

## 2022-03-16 VITALS — BP 100/60 | HR 44 | Temp 98.1°F | Resp 20 | Ht 68.0 in | Wt 180.0 lb

## 2022-03-16 DIAGNOSIS — G301 Alzheimer's disease with late onset: Secondary | ICD-10-CM | POA: Diagnosis not present

## 2022-03-16 DIAGNOSIS — F0280 Dementia in other diseases classified elsewhere without behavioral disturbance: Secondary | ICD-10-CM | POA: Diagnosis not present

## 2022-03-16 DIAGNOSIS — Z780 Asymptomatic menopausal state: Secondary | ICD-10-CM

## 2022-03-16 NOTE — Telephone Encounter (Signed)
Pt daughter states the place pt was intially referred to has suggested pt be referred to [place]   [facility] [place], [place], [place]: [place], [place]: [place]: [phone] Daughter states if referral is sent soon they can possibly see pt this month

## 2022-03-16 NOTE — Progress Notes (Signed)
Careteam: Patient Care Team: [name], [name], NP as PCP - General (Family Medicine) [name], MD as Consulting Physician (Ophthalmology) Felecia Shelling, Nanine Means, MD (Neurology)  PLACE OF SERVICE:  [place] Directive information Does Patient Have a Medical Advance Directive?: Yes, Type of Advance Directive: [place];Living will, Does patient want to make changes to medical advance directive?: No - Patient declined  No Known Allergies  Chief Complaint  Patient presents with   Form Completion    Fill out form.      HPI: Patient is a 76 y.o. female for form completion [facility].  She has been going but needed form to be completed for her to continue to attend.  No acute concerns today and has follow up with dinah scheduled later in the month with labs prior to visit.   Review of Systems:  Review of Systems  Constitutional:  Negative for chills, fever and weight loss.  HENT:  Negative for tinnitus.   Respiratory:  Negative for cough, sputum production and shortness of breath.   Cardiovascular:  Negative for chest pain, palpitations and leg swelling.  Gastrointestinal:  Negative for abdominal pain, constipation, diarrhea and heartburn.  Genitourinary:  Negative for dysuria, frequency and urgency.  Musculoskeletal:  Negative for back pain, falls, joint pain and myalgias.  Skin: Negative.   Neurological:  Negative for dizziness and headaches.  Psychiatric/Behavioral:  Positive for memory loss. Negative for depression. The patient does not have insomnia.     Past Medical History:  Diagnosis Date   ADHD    Arthritis    Cataracts, bilateral    Dementia ([place])    Depression    Glaucoma    Hypertension    Polymyalgia rheumatica ([place])    Past Surgical History:  Procedure Laterality Date   PARTIAL HIP ARTHROPLASTY Right 2011   REPLACEMENT TOTAL KNEE Left 2013   Dr Jodell Cipro   Social History:   reports that she has quit  smoking. Her smoking use included cigarettes. She has a 10.00 pack-year smoking history. She has never used smokeless tobacco. She reports that she does not currently use alcohol. She reports that she does not use drugs.  Family History  Problem Relation Age of Onset   Diabetes Mother    Kidney failure Mother    High blood pressure Sister    Neuropathy Sister    Heart disease Brother    Asthma Sister    Sleep apnea Sister    Bipolar disorder Sister    Diabetes Sister    Diabetes Sister    Breast cancer Sister    Fibromyalgia Daughter    Colon cancer Other    Stomach cancer Other    Rectal cancer Other    Esophageal cancer Other     Medications: Patient's Medications  New Prescriptions   No medications on file  Previous Medications   ACETAMINOPHEN (TYLENOL) 500 MG TABLET    Take 500 mg by mouth every 6 (six) hours as needed. 1-2 PRN   AMLODIPINE (NORVASC) 10 MG TABLET    TAKE 1 TABLET BY MOUTH EVERY DAY   BRIMONIDINE-TIMOLOL (COMBIGAN) 0.2-0.5 % OPHTHALMIC SOLUTION    Place 1 drop into both eyes every 12 (twelve) hours.   BUPROPION (WELLBUTRIN XL) 300 MG 24 HR TABLET    TAKE 1 TABLET BY MOUTH EVERY DAY   CETIRIZINE (ZYRTEC) 10 MG TABLET    Take 10 mg by mouth daily.   CHOLECALCIFEROL (VITAMIN D)  125 MCG (5000 UT) CAPS    Take 1 capsule by mouth daily.   CRANBERRY-CHOLECALCIFEROL 4200-500 MG-UNIT CAPS    Take 1 capsule by mouth daily.   DICLOFENAC SODIUM (VOLTAREN) 1 % GEL    Apply 2 g topically 4 (four) times daily.   DONEPEZIL (ARICEPT) 10 MG TABLET    Decrease from 10 mg tablet to 5 mg tablet daily x 1 week then stop.   DOXAZOSIN (CARDURA) 4 MG TABLET    TAKE 1 TABLET BY MOUTH EVERY DAY   GABAPENTIN (NEURONTIN) 100 MG CAPSULE    TAKE 1 CAPSULE BY MOUTH AT BEDTIME.   LATANOPROSTENE BUNOD (VYZULTA) 0.024 % SOLN    Apply to eye daily.   LOPERAMIDE HCL (IMODIUM PO)    Take by mouth at bedtime.   MELOXICAM (MOBIC) 7.5 MG TABLET    TAKE 1 TABLET BY MOUTH EVERY DAY   MEMANTINE  (NAMENDA) 10 MG TABLET    Take 1 tablet (10 mg total) by mouth 2 (two) times daily.   OMEGA-3 FATTY ACIDS (OMEGA 3 PO)    Take 750 mg by mouth daily.   QUETIAPINE (SEROQUEL) 25 MG TABLET    Take 0.5 tablets (12.5 mg total) by mouth 2 (two) times daily.   SPIRONOLACTONE (ALDACTONE) 25 MG TABLET    TAKE 1 TABLET BY MOUTH TWICE A DAY   TURMERIC (QC TUMERIC COMPLEX PO)    Take 15 mLs by mouth daily.   VITAMIN C (ASCORBIC ACID) 500 MG TABLET    Take 500 mg by mouth daily.  Modified Medications   No medications on file  Discontinued Medications   ASPIRIN 325 MG TABLET    Take 325 mg by mouth as needed.     Physical Exam:  Vitals:   03/16/22 0903 03/16/22 0923  BP:  100/60  Pulse:  (!) 44  Resp:  20  Temp:  98.1 F (36.7 C)  TempSrc:  Temporal  SpO2:  99%  Weight:  180 lb (81.6 kg)  Height: '5\' 8"'$  (1.727 m) '5\' 8"'$  (1.727 m)   Body mass index is 27.37 kg/m. Wt Readings from Last 3 Encounters:  03/16/22 180 lb (81.6 kg)  03/08/22 176 lb 8 oz (80.1 kg)  01/25/22 178 lb 3.2 oz (80.8 kg)    Physical Exam Constitutional:      Appearance: Normal appearance.  Cardiovascular:     Rate and Rhythm: Normal rate.  Pulmonary:     [place]: Pulmonary effort is normal.     Breath sounds: Normal breath sounds.  Skin:    General: Skin is warm and dry.  Neurological:     Mental Status: She is alert. Mental status is at baseline.  Psychiatric:        Mood and Affect: Mood normal.     Labs reviewed: Basic Metabolic Panel: Recent Labs    08/04/21 0803 10/06/21 1016  NA 139 142  K 4.9 4.9  CL 108 107  CO2 26 27  GLUCOSE 91 78  BUN 23 26*  CREATININE 1.04* 1.13*  CALCIUM 9.4 9.4  TSH 3.08 2.94   Liver Function Tests: Recent Labs    08/04/21 0803 10/06/21 1016  AST 13 15  ALT 8 8  BILITOT 0.4 0.4  PROT 6.4 7.2   No results for input(s): "LIPASE", "AMYLASE" in the last 8760 hours. No results for input(s): "AMMONIA" in the last 8760 hours. CBC: Recent Labs    08/04/21 0803  10/06/21 1016  WBC 4.0 5.0  NEUTROABS  1,812 2,805  HGB 11.2* 12.3  HCT 34.2* 37.0  MCV 95.0 95.1  PLT 154 166   Lipid Panel: Recent Labs    08/04/21 0803 10/06/21 1016  CHOL 154 172  HDL 61 63  LDLCALC 79 92  TRIG 59 79  CHOLHDL 2.5 2.7   TSH: Recent Labs    08/04/21 0803 10/06/21 1016  TSH 3.08 2.94   A1C: Lab Results  Component Value Date   HGBA1C 5.6 12/06/2018     Assessment/Plan 1. Late onset Alzheimer's disease without behavioral disturbance ([place]) [facility] form complete.   Carlos American. [place], [facility] [phone]

## 2022-03-21 ENCOUNTER — Other Ambulatory Visit: Payer: Self-pay | Admitting: *Deleted

## 2022-03-21 DIAGNOSIS — F0280 Dementia in other diseases classified elsewhere without behavioral disturbance: Secondary | ICD-10-CM

## 2022-03-21 DIAGNOSIS — R451 Restlessness and agitation: Secondary | ICD-10-CM

## 2022-03-21 DIAGNOSIS — F32A Depression, unspecified: Secondary | ICD-10-CM

## 2022-03-21 DIAGNOSIS — R443 Hallucinations, unspecified: Secondary | ICD-10-CM

## 2022-03-21 NOTE — Telephone Encounter (Signed)
I do not see any referrals in her chart

## 2022-03-21 NOTE — Telephone Encounter (Signed)
Spoke w/ Tori/[facility]. They could not see previous referral. I placed a new one and confirmed they can see new one that is placed and will work on getting this sent.

## 2022-03-27 ENCOUNTER — Telehealth: Payer: Self-pay | Admitting: Family Medicine

## 2022-03-27 NOTE — Telephone Encounter (Signed)
Referral for Psychiatry sent to [facility] [phone].

## 2022-03-30 ENCOUNTER — Telehealth: Payer: Self-pay | Admitting: *Deleted

## 2022-03-30 ENCOUNTER — Other Ambulatory Visit: Payer: Self-pay | Admitting: Family

## 2022-03-30 DIAGNOSIS — I10 Essential (primary) hypertension: Secondary | ICD-10-CM

## 2022-03-30 DIAGNOSIS — E782 Mixed hyperlipidemia: Secondary | ICD-10-CM

## 2022-03-30 DIAGNOSIS — E559 Vitamin D deficiency, unspecified: Secondary | ICD-10-CM

## 2022-03-30 NOTE — Progress Notes (Signed)
Fasting labs ordered

## 2022-03-30 NOTE — Telephone Encounter (Signed)
Labs ordered.

## 2022-03-30 NOTE — Telephone Encounter (Signed)
Patient is scheduled for Fasting Labs on 04/04/2022 but no lab orders are in Epic.   Please Place needed orders.

## 2022-04-04 ENCOUNTER — Other Ambulatory Visit: Payer: PPO

## 2022-04-04 DIAGNOSIS — E782 Mixed hyperlipidemia: Secondary | ICD-10-CM

## 2022-04-04 DIAGNOSIS — I10 Essential (primary) hypertension: Secondary | ICD-10-CM

## 2022-04-04 DIAGNOSIS — E559 Vitamin D deficiency, unspecified: Secondary | ICD-10-CM

## 2022-04-06 NOTE — Patient Instructions (Signed)
Please contact your local pharmacy, previous provider, or insurance carrier for vaccine/immunization records. Ensure that any procedures done outside of [facility] are faxed to us [phone] or you can sign release of records form at the front desk to keep your medical record updated.   ?

## 2022-04-06 NOTE — Assessment & Plan Note (Addendum)
Patient is taking Amlodipine '10mg'$  & Sprionolactone '25mg'$ 

## 2022-04-07 ENCOUNTER — Encounter: Payer: PPO | Admitting: Family

## 2022-04-07 DIAGNOSIS — I10 Essential (primary) hypertension: Secondary | ICD-10-CM

## 2022-04-07 NOTE — Progress Notes (Signed)
  This encounter was created in error - please disregard. No show 

## 2022-05-03 ENCOUNTER — Other Ambulatory Visit: Payer: PPO

## 2022-05-03 DIAGNOSIS — I10 Essential (primary) hypertension: Secondary | ICD-10-CM | POA: Diagnosis not present

## 2022-05-03 DIAGNOSIS — E559 Vitamin D deficiency, unspecified: Secondary | ICD-10-CM | POA: Diagnosis not present

## 2022-05-03 DIAGNOSIS — E782 Mixed hyperlipidemia: Secondary | ICD-10-CM | POA: Diagnosis not present

## 2022-05-06 LAB — COMPLETE METABOLIC PANEL WITH GFR
AG Ratio: 1.3 (calc) (ref 1.0–2.5)
ALT: 7 U/L (ref 6–29)
AST: 15 U/L (ref 10–35)
Albumin: 4.1 g/dL (ref 3.6–5.1)
Alkaline phosphatase (APISO): 68 U/L (ref 37–153)
BUN/Creatinine Ratio: 20 (calc) (ref 6–22)
BUN: 27 mg/dL — ABNORMAL HIGH (ref 7–25)
CO2: 25 mmol/L (ref 20–32)
Calcium: 9.4 mg/dL (ref 8.6–10.4)
Chloride: 106 mmol/L (ref 98–110)
Creat: 1.35 mg/dL — ABNORMAL HIGH (ref 0.60–1.00)
Globulin: 3.1 g/dL (calc) (ref 1.9–3.7)
Glucose, Bld: 84 mg/dL (ref 65–99)
Potassium: 4.9 mmol/L (ref 3.5–5.3)
Sodium: 138 mmol/L (ref 135–146)
Total Bilirubin: 0.3 mg/dL (ref 0.2–1.2)
Total Protein: 7.2 g/dL (ref 6.1–8.1)
eGFR: 41 mL/min/{1.73_m2} — ABNORMAL LOW (ref >=60)

## 2022-05-06 LAB — CBC WITH DIFFERENTIAL/PLATELET
Absolute Monocytes: 538 cells/uL (ref 200–950)
Basophils Absolute: 19 cells/uL (ref 0–200)
Basophils Relative: 0.4 %
Eosinophils Absolute: 250 cells/uL (ref 15–500)
Eosinophils Relative: 5.2 %
HCT: 34.3 % — ABNORMAL LOW (ref 35.0–45.0)
Hemoglobin: 11.6 g/dL — ABNORMAL LOW (ref 11.7–15.5)
Lymphs Abs: 1363 cells/uL (ref 850–3900)
MCH: 31.9 pg (ref 27.0–33.0)
MCHC: 33.8 g/dL (ref 32.0–36.0)
MCV: 94.2 fL (ref 80.0–100.0)
MPV: 9.9 fL (ref 7.5–12.5)
Monocytes Relative: 11.2 %
Neutro Abs: 2630 cells/uL (ref 1500–7800)
Neutrophils Relative %: 54.8 %
Platelets: 176 10*3/uL (ref 140–400)
RBC: 3.64 10*6/uL — ABNORMAL LOW (ref 3.80–5.10)
RDW: 11.9 % (ref 11.0–15.0)
Total Lymphocyte: 28.4 %
WBC: 4.8 10*3/uL (ref 3.8–10.8)

## 2022-05-06 LAB — LIPID PANEL
Cholesterol: 176 mg/dL (ref <200)
HDL: 56 mg/dL (ref >=50)
LDL Cholesterol (Calc): 104 mg/dL (calc) — ABNORMAL HIGH
Non-HDL Cholesterol (Calc): 120 mg/dL (calc) (ref <130)
Total CHOL/HDL Ratio: 3.1 (calc) (ref <5.0)
Triglycerides: 70 mg/dL (ref <150)

## 2022-05-06 LAB — VITAMIN D 1,25 DIHYDROXY
Vitamin D 1, 25 (OH)2 Total: 24 pg/mL (ref 18–72)
Vitamin D2 1, 25 (OH)2: <8 pg/mL
Vitamin D3 1, 25 (OH)2: 24 pg/mL

## 2022-05-06 LAB — TSH: TSH: 3.17 mIU/L (ref 0.40–4.50)

## 2022-05-08 ENCOUNTER — Ambulatory Visit (INDEPENDENT_AMBULATORY_CARE_PROVIDER_SITE_OTHER): Payer: PPO | Admitting: Family

## 2022-05-08 ENCOUNTER — Encounter: Payer: Self-pay | Admitting: Family

## 2022-05-08 VITALS — BP 118/72 | HR 48 | Temp 97.5°F | Ht 68.0 in | Wt 174.2 lb

## 2022-05-08 DIAGNOSIS — F0280 Dementia in other diseases classified elsewhere without behavioral disturbance: Secondary | ICD-10-CM | POA: Diagnosis not present

## 2022-05-08 DIAGNOSIS — H6122 Impacted cerumen, left ear: Secondary | ICD-10-CM

## 2022-05-08 DIAGNOSIS — G301 Alzheimer's disease with late onset: Secondary | ICD-10-CM

## 2022-05-08 DIAGNOSIS — F331 Major depressive disorder, recurrent, moderate: Secondary | ICD-10-CM

## 2022-05-08 DIAGNOSIS — Z23 Encounter for immunization: Secondary | ICD-10-CM | POA: Diagnosis not present

## 2022-05-08 DIAGNOSIS — M353 Polymyalgia rheumatica: Secondary | ICD-10-CM

## 2022-05-08 DIAGNOSIS — E782 Mixed hyperlipidemia: Secondary | ICD-10-CM

## 2022-05-08 DIAGNOSIS — I10 Essential (primary) hypertension: Secondary | ICD-10-CM | POA: Diagnosis not present

## 2022-05-08 DIAGNOSIS — R2681 Unsteadiness on feet: Secondary | ICD-10-CM | POA: Diagnosis not present

## 2022-05-08 MED ORDER — TETANUS-DIPHTH-ACELL PERTUSSIS 5-2.5-18.5 LF-MCG/0.5 IM SUSP: 0.5000 mL | Freq: Once | INTRAMUSCULAR | 0 refills | Status: AC

## 2022-05-08 NOTE — Assessment & Plan Note (Signed)
Latest LDL slightly high compared to previous. -Dietary modification advised -Continue on omega 3 fatty acids.  Not on statin

## 2022-05-08 NOTE — Assessment & Plan Note (Signed)
Left ear lavage with warm water and hydrogen peroxide tolerated procedure well no instrument used.

## 2022-05-08 NOTE — Assessment & Plan Note (Signed)
-   Blood pressure well controlled -No side effects reported on amlodipine, doxazosin and spironolactone -Continue to monitor blood pressure

## 2022-05-08 NOTE — Assessment & Plan Note (Signed)
-   Mood stable -Continue on Wellbutrin -Continue to monitor

## 2022-05-08 NOTE — Assessment & Plan Note (Signed)
-   Progress states decline reported.daughter's thinking of placing it in memory care unit due to progressive decline in memory requiring increased supervision.  Daughter plans to talk with social worker as a Production manager for possible assistance with placement.  Will fill out [place] 2 form once facility has been identified daughter made aware.  Sometimes attempts to elope during the day.  No aggressive behaviors reported -Continue on Seroquel, memantine and donezepil -Continue with supportive care

## 2022-05-08 NOTE — Assessment & Plan Note (Signed)
-   Diagnosed in 2000 was treated with prednisone -Currently in remission -No weakness reported

## 2022-05-08 NOTE — Progress Notes (Signed)
Provider: [name]   Pam Knapp, Pam Bucks, NP  Patient Care Team: Pam Knapp, Pam Bucks, NP as PCP - General (Family Medicine) [name], MD as Consulting Physician (Ophthalmology) Felecia Shelling, Nanine Means, MD (Neurology)  Extended Emergency Contact Information Primary Emergency Contact: Pam Knapp Address: [address] [place] [address] Pam Knapp of [place] Work Phone: [phone] Mobile Phone: [phone] Relation: Daughter Secondary Emergency Contact: [name],[name]  [place] Phone: [phone] Relation: Grandson  Code Status:  Full Code  Goals of care: Advanced Directive information    03/16/2022    9:10 AM  Advanced Directives  Does Patient Have a Medical Advance Directive? Yes  Type of Paramedic of [place];Living will  Does patient want to make changes to medical advance directive? No - Patient declined  Copy of [place] in Chart? Yes - validated most recent copy scanned in chart (See row information)     Chief Complaint  Patient presents with   Medical Management of Chronic Issues    Patient presents today for a 7 month follow-up   Quality Metric Gaps    Zoster,TDAP, COIVD booster #4    HPI:  Pt is a 76 y.o. female seen today for 41-month follow-up for medical management of chronic diseases.  Has a medical history of essential hypertension, mixed hyperlipidemia late onset Alzheimer's disease without behavioral disturbances, peripheral neuropathy, left chronic hip pain, major depression disorder, chronic left shoulder pain, polymyalgia rheumatica among other conditions.  She is here with her daughter who provides HPI information. Recent lab results reviewed and discussed during visit. Lab work unremarkable except LDL cholesterol slightly high 104 previous 92, hemoglobin 11.6 previous 12.3 and BUN 27 creatinine 1.35 stable She denies any symptoms of bleeding. Appetite  described as very good though has lost weight previous weight was 180 and weight 174.2 this visit.  Discussed monitoring weight since she is on donepezil will consider discontinuing if progressive weight loss.  Dementia - continues to participate in day care program.continues to have difficulties redirecting at times.Daughter states patient sometimes tries to get out of the door whenever she gets any chance during the day.She requires 24 supervision. Daughter states thinking of placing her in a facility  No fall episodes reported.  Past Medical History:  Diagnosis Date   ADHD    Arthritis    Cataracts, bilateral    Dementia ([place])    Depression    Glaucoma    Hypertension    Polymyalgia rheumatica ([place])    Past Surgical History:  Procedure Laterality Date   PARTIAL HIP ARTHROPLASTY Right 2011   REPLACEMENT TOTAL KNEE Left 2013   Dr Jodell Cipro    No Known Allergies  Allergies as of 05/08/2022   No Known Allergies      Medication List        Accurate as of May 08, 2022  9:30 AM. If you have any questions, ask your nurse or doctor.          acetaminophen 500 MG tablet Commonly known as: TYLENOL Take 500 mg by mouth every 6 (six) hours as needed. 1-2 PRN   amLODipine 10 MG tablet Commonly known as: NORVASC TAKE 1 TABLET BY MOUTH EVERY DAY   ascorbic acid 500 MG tablet Commonly known as: VITAMIN C Take 500 mg by mouth daily.   buPROPion 300 MG 24 hr tablet Commonly known as: WELLBUTRIN XL TAKE 1 TABLET BY  MOUTH EVERY DAY   cetirizine 10 MG tablet Commonly known as: ZYRTEC Take 10 mg by mouth daily.   Combigan 0.2-0.5 % ophthalmic solution Generic drug: brimonidine-timolol Place 1 drop into both eyes every 12 (twelve) hours.   Cranberry-Cholecalciferol 4200-500 MG-UNIT Caps Take 1 capsule by mouth daily.   diclofenac Sodium 1 % Gel Commonly known as: Voltaren Apply 2 g topically 4 (four) times daily.   donepezil 10 MG tablet Commonly known as:  ARICEPT Decrease from 10 mg tablet to 5 mg tablet daily x 1 week then stop.   doxazosin 4 MG tablet Commonly known as: CARDURA TAKE 1 TABLET BY MOUTH EVERY DAY   gabapentin 100 MG capsule Commonly known as: NEURONTIN TAKE 1 CAPSULE BY MOUTH AT BEDTIME.   IMODIUM PO Take by mouth at bedtime.   meloxicam 7.5 MG tablet Commonly known as: MOBIC TAKE 1 TABLET BY MOUTH EVERY DAY   memantine 10 MG tablet Commonly known as: Namenda Take 1 tablet (10 mg total) by mouth 2 (two) times daily.   OMEGA 3 PO Take 750 mg by mouth daily.   QC TUMERIC COMPLEX PO Take 15 mLs by mouth daily.   QUEtiapine 25 MG tablet Commonly known as: SEROquel Take 0.5 tablets (12.5 mg total) by mouth 2 (two) times daily.   spironolactone 25 MG tablet Commonly known as: ALDACTONE TAKE 1 TABLET BY MOUTH TWICE A DAY   Vitamin D 125 MCG (5000 UT) Caps Take 1 capsule by mouth daily.   Vyzulta 0.024 % Soln Generic drug: Latanoprostene Bunod Apply to eye daily.        Review of Systems  Constitutional:  Negative for appetite change, chills, fatigue, fever and unexpected weight change.  HENT:  Negative for congestion, dental problem, ear discharge, ear pain, facial swelling, hearing loss, nosebleeds, postnasal drip, rhinorrhea, sinus pressure, sinus pain, sneezing, sore throat, tinnitus and trouble swallowing.   Eyes:  Negative for pain, discharge, redness, itching and visual disturbance.  Respiratory:  Negative for cough, chest tightness, shortness of breath and wheezing.   Cardiovascular:  Negative for chest pain, palpitations and leg swelling.  Gastrointestinal:  Negative for abdominal distention, abdominal pain, blood in stool, constipation, diarrhea, nausea and vomiting.  Endocrine: Negative for cold intolerance, heat intolerance, polydipsia, polyphagia and polyuria.  Genitourinary:  Negative for difficulty urinating, dysuria, flank pain, frequency and urgency.  Musculoskeletal:  Positive for  arthralgias and gait problem. Negative for back pain, joint swelling, myalgias, neck pain and neck stiffness.  Skin:  Negative for color change, pallor, rash and wound.  Neurological:  Negative for dizziness, syncope, speech difficulty, weakness, light-headedness, numbness and headaches.  Hematological:  Does not bruise/bleed easily.  Psychiatric/Behavioral:  Negative for agitation, behavioral problems, confusion, hallucinations, sleep disturbance and suicidal ideas. The patient is not nervous/anxious.     Immunization History  Administered Date(s) Administered   Fluad Quad(high Dose 65+) 10/06/2021   Influenza, High Dose Seasonal PF 07/29/2017, 08/30/2018, 08/14/2019   Influenza-Unspecified 08/12/2020   PFIZER(Purple Top)SARS-COV-2 Vaccination 11/20/2019, 12/15/2019, 09/09/2020   Pneumococcal Conjugate-13 09/09/2019   Pneumococcal Polysaccharide-23 11/22/2016   Zoster Recombinat (Shingrix) 10/22/2018   Pertinent  Health Maintenance Due  Topic Date Due   INFLUENZA VACCINE  05/09/2022   DEXA SCAN  Completed   COLONOSCOPY (Pts 45-3yrs Insurance coverage will need to be confirmed)  Discontinued      10/06/2021    9:09 AM 10/12/2021    8:28 AM 11/11/2021    8:06 AM 01/25/2022    2:45 PM  03/16/2022    9:10 AM  Fall Risk  Falls in the past year? 0 0 0 0 0  Was there an injury with Fall? 0 0 0 0 0  Fall Risk Category Calculator 0 0 0 0 0  Fall Risk Category Low Low Low Low Low  Patient Fall Risk Level Low fall risk Low fall risk Low fall risk Low fall risk Low fall risk  Patient at Risk for Falls Due to No Fall Risks No Fall Risks No Fall Risks No Fall Risks No Fall Risks  Fall risk Follow up Falls evaluation completed Falls evaluation completed Falls evaluation completed Falls evaluation completed Falls evaluation completed   Functional Status Survey:    Vitals:   05/08/22 0921  BP: 118/72  Pulse: (!) 48  Temp: (!) 97.5 F (36.4 C)  SpO2: 98%  Weight: 174 lb 3.2 oz (79 kg)   Height: $Remove'5\' 8"'kwZuSNl$  (1.727 m)   Body mass index is 26.49 kg/m. Physical Exam Vitals reviewed.  Constitutional:      General: She is not in acute distress.    Appearance: Normal appearance. She is normal weight. She is not ill-appearing or diaphoretic.  HENT:     Head: Normocephalic.     Right Ear: Tympanic membrane, ear canal and external ear normal. There is no impacted cerumen.     Left Ear: There is impacted cerumen.     Ears:     Comments: Left ear lavage with warm water and hydrogen peroxide tolerated procedure well no instrument used.    Nose: Nose normal. No congestion or rhinorrhea.     Mouth/Throat:     Mouth: Mucous membranes are moist.     Pharynx: Oropharynx is clear. No oropharyngeal exudate or posterior oropharyngeal erythema.  Eyes:     General: No scleral icterus.       Right eye: No discharge.        Left eye: No discharge.     Extraocular Movements: Extraocular movements intact.     Conjunctiva/sclera: Conjunctivae normal.     Pupils: Pupils are equal, round, and reactive to light.  Neck:     Vascular: No carotid bruit.  Cardiovascular:     Rate and Rhythm: Normal rate and regular rhythm.     Pulses: Normal pulses.     Heart sounds: Normal heart sounds. No murmur heard.    No friction rub. No gallop.  Pulmonary:     Effort: Pulmonary effort is normal. No respiratory distress.     Breath sounds: Normal breath sounds. No wheezing, rhonchi or rales.  Chest:     Chest wall: No tenderness.  Abdominal:     General: Bowel sounds are normal. There is no distension.     Palpations: Abdomen is soft. There is no mass.     Tenderness: There is no abdominal tenderness. There is no right CVA tenderness, left CVA tenderness, guarding or rebound.  Musculoskeletal:        General: No swelling or tenderness. Normal range of motion.     Cervical back: Normal range of motion. No rigidity or tenderness.     Right lower leg: No edema.     Left lower leg: No edema.   Lymphadenopathy:     Cervical: No cervical adenopathy.  Skin:    General: Skin is warm and dry.     Coloration: Skin is not pale.     Findings: No bruising, erythema, lesion or rash.  Neurological:     Mental Status:  She is alert. Mental status is at baseline.     Cranial Nerves: No cranial nerve deficit.     Sensory: No sensory deficit.     Motor: No weakness.     Coordination: Coordination normal.     Gait: Gait abnormal.  Psychiatric:        Mood and Affect: Mood normal.        Speech: Speech normal.        Behavior: Behavior normal.        Thought Content: Thought content normal.        Cognition and Memory: Cognition is impaired. Memory is impaired.        Judgment: Judgment normal.     Labs reviewed: Recent Labs    08/04/21 0803 10/06/21 1016 05/03/22 0840  NA 139 142 138  K 4.9 4.9 4.9  CL 108 107 106  CO2 $Re'26 27 25  'bTi$ GLUCOSE 91 78 84  BUN 23 26* 27*  CREATININE 1.04* 1.13* 1.35*  CALCIUM 9.4 9.4 9.4   Recent Labs    08/04/21 0803 10/06/21 1016 05/03/22 0840  AST $Re'13 15 15  'APK$ ALT $R'8 8 7  'ie$ BILITOT 0.4 0.4 0.3  PROT 6.4 7.2 7.2   Recent Labs    08/04/21 0803 10/06/21 1016 05/03/22 0840  WBC 4.0 5.0 4.8  NEUTROABS 1,812 2,805 2,630  HGB 11.2* 12.3 11.6*  HCT 34.2* 37.0 34.3*  MCV 95.0 95.1 94.2  PLT 154 166 176   Lab Results  Component Value Date   TSH 3.17 05/03/2022   Lab Results  Component Value Date   HGBA1C 5.6 12/06/2018   Lab Results  Component Value Date   CHOL 176 05/03/2022   HDL 56 05/03/2022   LDLCALC 104 (H) 05/03/2022   TRIG 70 05/03/2022   CHOLHDL 3.1 05/03/2022    Significant Diagnostic Results in last 30 days:  No results found.  Assessment/Plan Problem List Items Addressed This Visit       Cardiovascular and Mediastinum   Essential hypertension - Primary    - Blood pressure well controlled -No side effects reported on amlodipine, doxazosin and spironolactone -Continue to monitor blood pressure      Relevant  Orders   TSH   COMPLETE METABOLIC PANEL WITH GFR   CBC with Differential/Platelet     Nervous and Auditory   Late onset Alzheimer's disease without behavioral disturbance ([place])    - Progress states decline reported.daughter's thinking of placing it in memory care unit due to progressive decline in memory requiring increased supervision.  Daughter plans to talk with social worker as a Production manager for possible assistance with placement.  Will fill out [place] 2 form once facility has been identified daughter made aware.  Sometimes attempts to elope during the day.  No aggressive behaviors reported -Continue on Seroquel, memantine and donezepil -Continue with supportive care      Impacted cerumen of left ear    Left ear lavage with warm water and hydrogen peroxide tolerated procedure well no instrument used.        Other   Unsteady gait    - No fall episode reported -Continues to ambulate with right hand cane -Continue with fall and safety precautions      Polymyalgia rheumatica ([place])    - Diagnosed in 2000 was treated with prednisone -Currently in remission -No weakness reported      Need for Tdap vaccination    - Advised on previous visit to get tetanus shot at the  pharmacy but did not. Advised to get Tdap vaccine at the pharmacy. Script send to pharmacy today       Relevant Medications   Tdap (BOOSTRIX) 5-2.5-18.5 LF-MCG/0.5 injection   Mixed hyperlipidemia    Latest LDL slightly high compared to previous. -Dietary modification advised -Continue on omega 3 fatty acids.  Not on statin      Relevant Orders   Lipid panel   Depression    - Mood stable -Continue on Wellbutrin -Continue to monitor         Family/ staff Communication: Reviewed plan of care with patient and daughter verbalized understanding  Labs/tests ordered:  - CBC with Differential/Platelet - CMP with eGFR([facility]) - TSH - Lipid panel  Next Appointment : Return in about 6 months (around  11/08/2022) for medical mangement of chronic issues., Fasting labs in 6 months prior to visit.   Sandrea Hughs, NP

## 2022-05-08 NOTE — Assessment & Plan Note (Signed)
-   No fall episode reported -Continues to ambulate with right hand cane -Continue with fall and safety precautions

## 2022-05-08 NOTE — Assessment & Plan Note (Signed)
-   Advised on previous visit to get tetanus shot at the pharmacy but did not. Advised to get Tdap vaccine at the pharmacy. Script send to pharmacy today

## 2022-05-16 ENCOUNTER — Other Ambulatory Visit: Payer: Self-pay | Admitting: Family

## 2022-05-16 DIAGNOSIS — I10 Essential (primary) hypertension: Secondary | ICD-10-CM

## 2022-05-24 ENCOUNTER — Other Ambulatory Visit: Payer: Self-pay | Admitting: Family

## 2022-05-24 DIAGNOSIS — G8929 Other chronic pain: Secondary | ICD-10-CM

## 2022-05-24 DIAGNOSIS — F02A11 Dementia in other diseases classified elsewhere, mild, with agitation: Secondary | ICD-10-CM | POA: Diagnosis not present

## 2022-06-07 ENCOUNTER — Other Ambulatory Visit: Payer: Self-pay | Admitting: Family

## 2022-06-07 ENCOUNTER — Telehealth: Payer: Self-pay | Admitting: *Deleted

## 2022-06-07 DIAGNOSIS — I10 Essential (primary) hypertension: Secondary | ICD-10-CM

## 2022-06-07 NOTE — Telephone Encounter (Signed)
I did the requested refill. In looking at Pam Knapp's last visit note, patient did not make a return appointment, as Pam Knapp requested for a 6 month follow up.  I tried calling her to make an appointment twice and the calls could not be connected. I routed a note to [place], saying I would call patient about appointment.

## 2022-06-08 NOTE — Telephone Encounter (Signed)
Called patient's home number this time and was able to leave a message. Asked patient to call main number to make her follow up appointment.

## 2022-06-28 ENCOUNTER — Emergency Department (HOSPITAL_COMMUNITY): Payer: PPO

## 2022-06-28 ENCOUNTER — Emergency Department (HOSPITAL_COMMUNITY)
Admission: EM | Admit: 2022-06-28 | Discharge: 2022-06-28 | Disposition: A | Discharge status: Home / Self Care | Payer: PPO | Attending: Emergency Medicine | Admitting: Emergency Medicine

## 2022-06-28 DIAGNOSIS — G301 Alzheimer's disease with late onset: Secondary | ICD-10-CM | POA: Diagnosis not present

## 2022-06-28 DIAGNOSIS — Z20822 Contact with and (suspected) exposure to covid-19: Secondary | ICD-10-CM | POA: Insufficient documentation

## 2022-06-28 DIAGNOSIS — I10 Essential (primary) hypertension: Secondary | ICD-10-CM | POA: Insufficient documentation

## 2022-06-28 DIAGNOSIS — F02B11 Dementia in other diseases classified elsewhere, moderate, with agitation: Secondary | ICD-10-CM | POA: Diagnosis not present

## 2022-06-28 DIAGNOSIS — I959 Hypotension, unspecified: Secondary | ICD-10-CM | POA: Diagnosis not present

## 2022-06-28 DIAGNOSIS — Z79899 Other long term (current) drug therapy: Secondary | ICD-10-CM | POA: Diagnosis not present

## 2022-06-28 DIAGNOSIS — R000 Tachycardia, unspecified: Secondary | ICD-10-CM | POA: Diagnosis not present

## 2022-06-28 DIAGNOSIS — R0789 Other chest pain: Secondary | ICD-10-CM | POA: Diagnosis not present

## 2022-06-28 DIAGNOSIS — R001 Bradycardia, unspecified: Secondary | ICD-10-CM | POA: Diagnosis not present

## 2022-06-28 DIAGNOSIS — F0280 Dementia in other diseases classified elsewhere without behavioral disturbance: Secondary | ICD-10-CM | POA: Diagnosis not present

## 2022-06-28 DIAGNOSIS — R4182 Altered mental status, unspecified: Secondary | ICD-10-CM | POA: Diagnosis not present

## 2022-06-28 DIAGNOSIS — G309 Alzheimer's disease, unspecified: Secondary | ICD-10-CM | POA: Diagnosis not present

## 2022-06-28 LAB — CBC
HCT: 34.5 % — ABNORMAL LOW (ref 36.0–46.0)
Hemoglobin: 11.3 g/dL — ABNORMAL LOW (ref 12.0–15.0)
MCH: 31.6 pg (ref 26.0–34.0)
MCHC: 32.8 g/dL (ref 30.0–36.0)
MCV: 96.4 fL (ref 80.0–100.0)
Platelets: 166 10*3/uL (ref 150–400)
RBC: 3.58 MIL/uL — ABNORMAL LOW (ref 3.87–5.11)
RDW: 12.7 % (ref 11.5–15.5)
WBC: 4.9 10*3/uL (ref 4.0–10.5)
nRBC: 0 % (ref 0.0–0.2)

## 2022-06-28 LAB — COMPREHENSIVE METABOLIC PANEL
ALT: 10 U/L (ref 0–44)
AST: 15 U/L (ref 15–41)
Albumin: 3.5 g/dL (ref 3.5–5.0)
Alkaline Phosphatase: 45 U/L (ref 38–126)
Anion gap: 7 (ref 5–15)
BUN: 20 mg/dL (ref 8–23)
CO2: 26 mmol/L (ref 22–32)
Calcium: 9.5 mg/dL (ref 8.9–10.3)
Chloride: 107 mmol/L (ref 98–111)
Creatinine, Ser: 1.11 mg/dL — ABNORMAL HIGH (ref 0.44–1.00)
GFR, Estimated: 51 mL/min — ABNORMAL LOW (ref >60)
Glucose, Bld: 93 mg/dL (ref 70–99)
Potassium: 4.6 mmol/L (ref 3.5–5.1)
Sodium: 140 mmol/L (ref 135–145)
Total Bilirubin: 0.6 mg/dL (ref 0.3–1.2)
Total Protein: 6.8 g/dL (ref 6.5–8.1)

## 2022-06-28 LAB — CBG MONITORING, ED: Glucose-Capillary: 112 mg/dL — ABNORMAL HIGH (ref 70–99)

## 2022-06-28 LAB — RAPID URINE DRUG SCREEN, HOSP PERFORMED
Amphetamines: NOT DETECTED
Barbiturates: NOT DETECTED
Benzodiazepines: NOT DETECTED
Cocaine: NOT DETECTED
Opiates: NOT DETECTED
Tetrahydrocannabinol: NOT DETECTED

## 2022-06-28 LAB — SARS CORONAVIRUS 2 BY RT PCR: SARS Coronavirus 2 by RT PCR: NEGATIVE

## 2022-06-28 LAB — URINALYSIS, ROUTINE W REFLEX MICROSCOPIC
Bilirubin Urine: NEGATIVE
Glucose, UA: NEGATIVE mg/dL
Hgb urine dipstick: NEGATIVE
Ketones, ur: NEGATIVE mg/dL
Leukocytes,Ua: NEGATIVE
Nitrite: NEGATIVE
Protein, ur: NEGATIVE mg/dL
Specific Gravity, Urine: 1.018 (ref 1.005–1.030)
pH: 5 (ref 5.0–8.0)

## 2022-06-28 LAB — TSH: TSH: 2.367 u[IU]/mL (ref 0.350–4.500)

## 2022-06-28 MED ORDER — LORAZEPAM 2 MG/ML IJ SOLN
0.5000 mg | Freq: Once | INTRAMUSCULAR | Status: AC
  Administered 2022-06-28: 0.5 mg via INTRAVENOUS
  Filled 2022-06-28: qty 1

## 2022-06-28 MED ORDER — LORAZEPAM 2 MG/ML IJ SOLN
1.0000 mg | Freq: Once | INTRAMUSCULAR | Status: AC
  Administered 2022-06-28: 1 mg via INTRAVENOUS
  Filled 2022-06-28: qty 1

## 2022-06-28 NOTE — ED Provider Triage Note (Signed)
Emergency Medicine Provider Triage Evaluation Note  Pam Knapp , a 76 y.o. female  was evaluated in triage.  Pt complains of AMS.  Patient here with altered mental status from adult day center, had some slurred speech and has been a little bit slower recently also some sinus bradycardia reported.  And is tangential at the moment, moves upper and lower extremities.Unable to obtain hx due to  Mental status. Review of Systems  Positive:  Negative:   Physical Exam  BP (!) 144/108 (BP Location: Left Arm)   Pulse (!) 47   Temp 97.7 F (36.5 C) (Oral)   Resp 15   SpO2 100%  Gen:   Awake, no distress   Resp:  Normal effort  MSK:   Moves extremities without difficulty  Other:  Tangential speech, cooperative  Medical Decision Making  Medically screening exam initiated at 10:20 AM.  Appropriate orders placed.  Pam Knapp was informed that the remainder of the evaluation will be completed by another provider, this initial triage assessment does not replace that evaluation, and the importance of remaining in the ED until their evaluation is complete.     Janeece Fitting, PA-C 06/28/22 1021

## 2022-06-28 NOTE — ED Triage Notes (Addendum)
EMS stated, from adult day center , that has said she has some altered mental status, she had slurred speech , last few days is slower.  She has some sinus bradycardia. No stroke deficits. 20 g IV in left forearm Pt. Walking around triage confused.
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
## 2022-06-28 NOTE — ED Notes (Signed)
Pt educated that a urine sample is needed, bedside commode provided.

## 2022-06-28 NOTE — ED Notes (Signed)
Pt continuously trying to get out of bed and pulling off cord, EDP notified and medications ordered. Pt placed in yellow socks and door left open for visualization
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
## 2022-06-29 ENCOUNTER — Other Ambulatory Visit: Payer: Self-pay

## 2022-06-29 MED ORDER — MEMANTINE HCL 10 MG PO TABS: 10.0000 mg | ORAL_TABLET | Freq: Two times a day (BID) | ORAL | 3 refills | Status: DC

## 2022-07-04 ENCOUNTER — Encounter: Payer: Self-pay | Admitting: Family

## 2022-07-04 ENCOUNTER — Ambulatory Visit (INDEPENDENT_AMBULATORY_CARE_PROVIDER_SITE_OTHER): Payer: PPO | Admitting: Family

## 2022-07-04 VITALS — BP 138/70 | HR 60 | Temp 96.8°F | Resp 16 | Ht 69.0 in | Wt 179.6 lb

## 2022-07-04 DIAGNOSIS — I10 Essential (primary) hypertension: Secondary | ICD-10-CM | POA: Diagnosis not present

## 2022-07-04 DIAGNOSIS — E782 Mixed hyperlipidemia: Secondary | ICD-10-CM | POA: Diagnosis not present

## 2022-07-04 DIAGNOSIS — F02818 Dementia in other diseases classified elsewhere, unspecified severity, with other behavioral disturbance: Secondary | ICD-10-CM

## 2022-07-04 DIAGNOSIS — R2681 Unsteadiness on feet: Secondary | ICD-10-CM

## 2022-07-04 DIAGNOSIS — M353 Polymyalgia rheumatica: Secondary | ICD-10-CM | POA: Diagnosis not present

## 2022-07-04 DIAGNOSIS — G629 Polyneuropathy, unspecified: Secondary | ICD-10-CM

## 2022-07-04 DIAGNOSIS — G309 Alzheimer's disease, unspecified: Secondary | ICD-10-CM

## 2022-07-04 DIAGNOSIS — M542 Cervicalgia: Secondary | ICD-10-CM

## 2022-07-04 DIAGNOSIS — F331 Major depressive disorder, recurrent, moderate: Secondary | ICD-10-CM | POA: Diagnosis not present

## 2022-07-04 DIAGNOSIS — G8929 Other chronic pain: Secondary | ICD-10-CM

## 2022-07-04 MED ORDER — QUETIAPINE FUMARATE 25 MG PO TABS: 25.0000 mg | ORAL_TABLET | Freq: Two times a day (BID) | ORAL | 1 refills | Status: DC
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
## 2022-07-10 ENCOUNTER — Telehealth: Payer: Self-pay

## 2022-07-10 NOTE — Telephone Encounter (Signed)
     Patient  visit on 9/20  at [facility]   Have you been able to follow up with your primary care physician? yes  The patient was or was not able to obtain any needed medicine or equipment. yes  Are there diet recommendations that you are having difficulty following? na  Patient expresses understanding of discharge instructions and education provided has no other needs at this time. yes     Pam Knapp Pop [facility] Care Guide, [place], Care Management  [phone] [address], [place], [place] [address] Phone: [phone] Email: [email or web address]    

## 2022-07-13 DIAGNOSIS — F02A11 Dementia in other diseases classified elsewhere, mild, with agitation: Secondary | ICD-10-CM | POA: Diagnosis not present

## 2022-08-15 ENCOUNTER — Telehealth: Payer: Self-pay | Admitting: Family

## 2022-08-15 NOTE — Telephone Encounter (Signed)
F L 2 form completed and signed please fax to [facility] as requested by POA.

## 2022-08-16 NOTE — Telephone Encounter (Signed)
Fl2 form faxed to [facility] as requested.

## 2022-08-21 ENCOUNTER — Other Ambulatory Visit: Payer: Self-pay | Admitting: Family

## 2022-08-21 DIAGNOSIS — I10 Essential (primary) hypertension: Secondary | ICD-10-CM

## 2022-08-28 DIAGNOSIS — F02A11 Dementia in other diseases classified elsewhere, mild, with agitation: Secondary | ICD-10-CM | POA: Diagnosis not present

## 2022-09-01 ENCOUNTER — Emergency Department (HOSPITAL_COMMUNITY)
Admission: EM | Admit: 2022-09-01 | Discharge: 2022-09-01 | Disposition: A | Discharge status: Home / Self Care | Payer: PPO | Attending: Emergency Medicine | Admitting: Emergency Medicine

## 2022-09-01 ENCOUNTER — Emergency Department (HOSPITAL_COMMUNITY): Payer: PPO

## 2022-09-01 ENCOUNTER — Other Ambulatory Visit: Payer: Self-pay

## 2022-09-01 DIAGNOSIS — F0390 Unspecified dementia without behavioral disturbance: Secondary | ICD-10-CM | POA: Diagnosis not present

## 2022-09-01 DIAGNOSIS — T68XXXA Hypothermia, initial encounter: Secondary | ICD-10-CM | POA: Diagnosis not present

## 2022-09-01 DIAGNOSIS — W06XXXA Fall from bed, initial encounter: Secondary | ICD-10-CM | POA: Diagnosis not present

## 2022-09-01 DIAGNOSIS — Z7401 Bed confinement status: Secondary | ICD-10-CM | POA: Diagnosis not present

## 2022-09-01 DIAGNOSIS — M25519 Pain in unspecified shoulder: Secondary | ICD-10-CM | POA: Diagnosis not present

## 2022-09-01 DIAGNOSIS — M25552 Pain in left hip: Secondary | ICD-10-CM | POA: Diagnosis not present

## 2022-09-01 DIAGNOSIS — I959 Hypotension, unspecified: Secondary | ICD-10-CM | POA: Diagnosis not present

## 2022-09-01 DIAGNOSIS — I6381 Other cerebral infarction due to occlusion or stenosis of small artery: Secondary | ICD-10-CM | POA: Diagnosis not present

## 2022-09-01 DIAGNOSIS — Z043 Encounter for examination and observation following other accident: Secondary | ICD-10-CM | POA: Diagnosis not present

## 2022-09-01 DIAGNOSIS — W19XXXA Unspecified fall, initial encounter: Secondary | ICD-10-CM | POA: Diagnosis not present

## 2022-09-01 DIAGNOSIS — M25512 Pain in left shoulder: Secondary | ICD-10-CM | POA: Insufficient documentation

## 2022-09-01 DIAGNOSIS — M5030 Other cervical disc degeneration, unspecified cervical region: Secondary | ICD-10-CM | POA: Diagnosis not present

## 2022-09-01 DIAGNOSIS — R0689 Other abnormalities of breathing: Secondary | ICD-10-CM | POA: Diagnosis not present

## 2022-09-01 DIAGNOSIS — R531 Weakness: Secondary | ICD-10-CM | POA: Diagnosis not present

## 2022-09-01 DIAGNOSIS — M19012 Primary osteoarthritis, left shoulder: Secondary | ICD-10-CM | POA: Diagnosis not present

## 2022-09-01 LAB — I-STAT CHEM 8, ED
BUN: 22 mg/dL (ref 8–23)
Calcium, Ion: 1.27 mmol/L (ref 1.15–1.40)
Chloride: 106 mmol/L (ref 98–111)
Creatinine, Ser: 1.1 mg/dL — ABNORMAL HIGH (ref 0.44–1.00)
Glucose, Bld: 85 mg/dL (ref 70–99)
HCT: 38 % (ref 36.0–46.0)
Hemoglobin: 12.9 g/dL (ref 12.0–15.0)
Potassium: 4.8 mmol/L (ref 3.5–5.1)
Sodium: 141 mmol/L (ref 135–145)
TCO2: 25 mmol/L (ref 22–32)

## 2022-09-01 NOTE — ED Notes (Signed)
[facility] called for ride home. Per SW, daughter will be waiting at home for patient.

## 2022-09-01 NOTE — ED Provider Notes (Signed)
[facility] Provider Note   CSN: [id number] Arrival date & time: 09/01/22  8527     History  Chief Complaint  Patient presents with   Lytle Michaels    Pam Knapp is a 76 y.o. female.  HPI Patient presents via EMS from home after an unwitnessed fall.  Patient has dementia which is limiting, level 5 caveat secondary to that. Patient is withdrawn, but answers direct questions distinctly.  Primis the patient has had substantial decline over the past 8 months, but has been interacting in a typical manner acknowledging that trend. Today she was found on the ground next to her bed by family members, and complained of left shoulder and hip pain.  No knowledge of head trauma.  Patient was reportedly hesitant to move the leg in transport but was otherwise unremarkable, hemodynamically and in terms of pain control.    Home Medications Prior to Admission medications   Medication Sig Start Date End Date Taking? Authorizing Provider  acetaminophen (TYLENOL) 500 MG tablet Take 500 mg by mouth every 6 (six) hours as needed. 1-2 PRN    [provider]  amLODipine (NORVASC) 10 MG tablet TAKE 1 TABLET BY MOUTH EVERY DAY 06/07/22   [name], Dinah C, NP  brimonidine-timolol (COMBIGAN) 0.2-0.5 % ophthalmic solution Place 1 drop into both eyes every 12 (twelve) hours.    [provider]  buPROPion (WELLBUTRIN XL) 300 MG 24 hr tablet TAKE 1 TABLET BY MOUTH EVERY DAY 03/03/22   [name], Dinah C, NP  cetirizine (ZYRTEC) 10 MG tablet Take 10 mg by mouth daily.    [provider]  Cholecalciferol (VITAMIN D) 125 MCG (5000 UT) CAPS Take 1 capsule by mouth daily.    [provider]  Cranberry-Cholecalciferol 4200-500 MG-UNIT CAPS Take 1 capsule by mouth daily.    [provider]  diclofenac Sodium (VOLTAREN) 1 % GEL Apply 2 g topically 4 (four) times daily. 01/27/21   [name], Dinah C, NP  donepezil (ARICEPT) 10 MG tablet Decrease  from 10 mg tablet to 5 mg tablet daily x 1 week then stop. 09/05/21   Lomax, Amy, NP  doxazosin (CARDURA) 4 MG tablet TAKE 1 TABLET BY MOUTH EVERY DAY 08/21/22   [name], Dinah C, NP  gabapentin (NEURONTIN) 100 MG capsule TAKE 1 CAPSULE BY MOUTH AT BEDTIME. 10/04/21   [name], Dinah C, NP  Latanoprostene Bunod (VYZULTA) 0.024 % SOLN Apply to eye daily.    [provider]  Loperamide HCl (IMODIUM PO) Take by mouth at bedtime.    [provider]  meloxicam (MOBIC) 7.5 MG tablet TAKE 1 TABLET BY MOUTH EVERY DAY 05/24/22   [name], Dinah C, NP  memantine (NAMENDA) 10 MG tablet Take 1 tablet (10 mg total) by mouth 2 (two) times daily. 06/29/22   Lomax, Amy, NP  Omega-3 Fatty Acids (OMEGA 3 PO) Take 750 mg by mouth daily.    [provider]  QUEtiapine (SEROQUEL) 25 MG tablet Take 1 tablet (25 mg total) by mouth 2 (two) times daily. 07/04/22   [name], Dinah C, NP  spironolactone (ALDACTONE) 25 MG tablet TAKE 1 TABLET BY MOUTH TWICE A DAY 08/21/22   [name], Dinah C, NP  Turmeric (QC TUMERIC COMPLEX PO) Take 15 mLs by mouth daily.    [provider]  vitamin C (ASCORBIC ACID) 500 MG tablet Take 500 mg by mouth daily.    [provider]      Allergies    Patient has  no known allergies.    Review of Systems   Review of Systems  Unable to perform ROS: Dementia    Physical Exam Updated Vital Signs BP 139/66   Pulse 67   Temp 97.8 F (36.6 C) (Oral)   Resp 16   Ht '5\' 9"'$  (1.753 m)   Wt 81.6 kg   SpO2 100%   BMI 26.58 kg/m  Physical Exam Vitals and nursing note reviewed.  Constitutional:      General: She is not in acute distress.    Appearance: She is well-developed.     Comments: Sickly appearing elderly F awake, alert, withdrawn but responding to specific questions  HENT:     Head: Normocephalic and atraumatic.  Eyes:     Conjunctiva/sclera: Conjunctivae normal.  Cardiovascular:     Rate and Rhythm: Normal rate and regular rhythm.   Pulmonary:     Effort: Pulmonary effort is normal. No respiratory distress.     Breath sounds: Normal breath sounds. No stridor.  Abdominal:     General: There is no distension.  Musculoskeletal:     Comments: No deformities, no ttp (shoulder or hip) no complaints.  Skin:    General: Skin is warm and dry.  Neurological:     Mental Status: She is alert.     Cranial Nerves: No cranial nerve deficit.     Motor: Atrophy present.     Comments: MAES and appropriately to command  Psychiatric:        Mood and Affect: Mood normal.        Cognition and Memory: Cognition is impaired. Memory is impaired.     ED Results / Procedures / Treatments   Labs (all labs ordered are listed, but only abnormal results are displayed) Labs Reviewed  I-STAT CHEM 8, ED - Abnormal; Notable for the following components:      Result Value   Creatinine, Ser 1.10 (*)    All other components within normal limits    EKG None  Radiology DG Hip Unilat With Pelvis 2-3 Views Left  Result Date: 09/01/2022 CLINICAL DATA:  Fall out of bed onto left side. EXAM: DG HIP (WITH OR WITHOUT PELVIS) 2-3V LEFT COMPARISON:  11/20/2007 FINDINGS: Left total hip arthroplasty is intact and normally located. Mild osteoarthritic change of the right hip. Mild degenerate change of the symphysis pubis joint and sacroiliac joints. Degenerative changes of the spine. IMPRESSION: 1. No acute findings. 2. Left total hip arthroplasty intact and normally located. Electronically Signed   By: Marin Olp M.D.   On: 09/01/2022 08:10   DG Shoulder Left  Result Date: 09/01/2022 CLINICAL DATA:  Fall out of bed onto left side. EXAM: LEFT SHOULDER - 2+ VIEW COMPARISON:  None Available. FINDINGS: Minimal degenerative changes over the [facility] joint and glenohumeral joints. No acute fracture or dislocation. IMPRESSION: 1. No acute findings. 2. Minimal degenerative changes. Electronically Signed   By: Marin Olp M.D.   On: 09/01/2022 08:08   CT Head  Wo Contrast  Result Date: 09/01/2022 CLINICAL DATA:  Trauma.  Fall. EXAM: CT HEAD WITHOUT CONTRAST CT CERVICAL SPINE WITHOUT CONTRAST TECHNIQUE: Multidetector CT imaging of the head and cervical spine was performed following the standard protocol without intravenous contrast. Multiplanar CT image reconstructions of the cervical spine were also generated. RADIATION DOSE REDUCTION: This exam was performed according to the departmental dose-optimization program which includes automated exposure control, adjustment of the mA and/or kV according to patient size and/or use of iterative reconstruction technique. COMPARISON:  06/28/2022. FINDINGS: CT HEAD FINDINGS Brain: No evidence of acute infarction, hemorrhage, hydrocephalus, extra-axial collection or mass lesion/mass effect. Remote left basal ganglia lacunar infarct. Vascular: No hyperdense vessel or unexpected calcification. Skull: Normal. Negative for fracture or focal lesion. Sinuses/Orbits: Paranasal sinuses and mastoid air cells are clear. Other: None. CT CERVICAL SPINE FINDINGS Alignment: Normal. Skull base and vertebrae: No acute fracture. No primary bone lesion or focal pathologic process. Soft tissues and spinal canal: No prevertebral fluid or swelling. No visible canal hematoma. Disc levels: Solid fusion of C7, T1 and T2. Disc space narrowing and endplate spurring noted at C3-4, C4-5, C5-6 and C6-7. Upper chest: No acute findings.  Centrilobular emphysema. Other: None IMPRESSION: 1. No acute intracranial abnormality. 2. Remote left basal ganglia lacunar infarct. 3. No evidence for cervical spine fracture or subluxation. 4. Cervical degenerative disc disease. 5. Solid fusion of C7, T1 and T2. 6.  Emphysema (ICD10-J43.9). Electronically Signed   By: Kerby Moors M.D.   On: 09/01/2022 07:59   CT Cervical Spine Wo Contrast  Result Date: 09/01/2022 CLINICAL DATA:  Trauma.  Fall. EXAM: CT HEAD WITHOUT CONTRAST CT CERVICAL SPINE WITHOUT CONTRAST TECHNIQUE:  Multidetector CT imaging of the head and cervical spine was performed following the standard protocol without intravenous contrast. Multiplanar CT image reconstructions of the cervical spine were also generated. RADIATION DOSE REDUCTION: This exam was performed according to the departmental dose-optimization program which includes automated exposure control, adjustment of the mA and/or kV according to patient size and/or use of iterative reconstruction technique. COMPARISON:  06/28/2022. FINDINGS: CT HEAD FINDINGS Brain: No evidence of acute infarction, hemorrhage, hydrocephalus, extra-axial collection or mass lesion/mass effect. Remote left basal ganglia lacunar infarct. Vascular: No hyperdense vessel or unexpected calcification. Skull: Normal. Negative for fracture or focal lesion. Sinuses/Orbits: Paranasal sinuses and mastoid air cells are clear. Other: None. CT CERVICAL SPINE FINDINGS Alignment: Normal. Skull base and vertebrae: No acute fracture. No primary bone lesion or focal pathologic process. Soft tissues and spinal canal: No prevertebral fluid or swelling. No visible canal hematoma. Disc levels: Solid fusion of C7, T1 and T2. Disc space narrowing and endplate spurring noted at C3-4, C4-5, C5-6 and C6-7. Upper chest: No acute findings.  Centrilobular emphysema. Other: None IMPRESSION: 1. No acute intracranial abnormality. 2. Remote left basal ganglia lacunar infarct. 3. No evidence for cervical spine fracture or subluxation. 4. Cervical degenerative disc disease. 5. Solid fusion of C7, T1 and T2. 6.  Emphysema (ICD10-J43.9). Electronically Signed   By: Kerby Moors M.D.   On: 09/01/2022 07:59    Procedures Procedures    Medications Ordered in ED Medications - No data to display  ED Course/ Medical Decision Making/ A&P                           Medical Decision Making Elderly female with dementia presents after unwitnessed fall pain in her left shoulder, left hip reportedly, but none on exam.   Patient follows commands, moves her extremities both spontaneously and to command, has no obvious deformities.  Concern for fracture, versus contusion, possible head trauma or cervical spine trauma as well.  Amount and/or Complexity of Data Reviewed Independent Historian: EMS External Data Reviewed: notes.    Details: Prior evaluation with worsening cognitive status within the past 6 months Labs:  Decision-making details documented in ED Course. Radiology: ordered. Decision-making details documented in ED Course.  Risk OTC drugs. Decision regarding hospitalization.  8:32 AM Patient in no  distress, calm, hemodynamically unremarkable.  I reviewed her x-rays, CT scans, no evidence for acute new fracture, no evidence for intracranial hemorrhage.  She is in no distress.  Elderly female presenting with pain reported prior to ED arrival after mechanical fall that was unwitnessed.  Given her dementia, unwitnessed status, CT scans, head, neck performed in addition x-rays.  Patient's physical exam reassuring, vital signs reassuring, low suspicion for occult intracranial hemorrhage, no evidence for fracture and remained unremarkable for monitoring duration in the ED.  Patient discharged home.  Final Clinical Impression(s) / ED Diagnoses Final diagnoses:  Fall, initial encounter    Rx / DC Orders ED Discharge Orders     None         Carmin Muskrat, MD 09/01/22 [phone]

## 2022-09-01 NOTE — Discharge Instructions (Signed)
As discussed, your evaluation today has been largely reassuring.  But, it is important that you monitor your condition carefully, and do not hesitate to return to the ED if you develop new, or concerning changes in your condition. ? ?Otherwise, please follow-up with your physician for appropriate ongoing care. ? ?

## 2022-09-01 NOTE — ED Notes (Signed)
Pt transported to CT via stretcher.  

## 2022-09-01 NOTE — ED Notes (Signed)
Daughter called to notify patient is ready for discharge. Per pt's daughter, pt has had many recent falls and she is unable to care for her any longer. MD notified.

## 2022-09-01 NOTE — Progress Notes (Signed)
CSW spoke with patient's daughter [name] who states she is at home ready to receive the patient. CSW confirmed that the address on the facesheet is correct.  RN aware and will call [facility] for transport.  Madilyn Fireman, MSW, LCSW Transitions of Care  Clinical Social Worker II [phone]

## 2022-09-01 NOTE — ED Notes (Signed)
Pt found up ambulating in room. Pt states she was trying to find her way home. Pt brief saturated. Clean brief placed on patient. Changed into hospital gown. Linens changed. Pt settled back into bed and provided with warm blanket. Lights dimmed for patient comfort.

## 2022-09-01 NOTE — ED Triage Notes (Signed)
Pt BIB [facility] from home after reported fall out of bed. Per EMS, pt lives at home with daughter and was C/O L hip and shoulder pain after fall. Hx dementia.

## 2022-09-02 ENCOUNTER — Encounter (HOSPITAL_COMMUNITY): Payer: Self-pay | Admitting: Radiology

## 2022-09-02 ENCOUNTER — Emergency Department (HOSPITAL_COMMUNITY): Payer: PPO

## 2022-09-02 ENCOUNTER — Emergency Department (HOSPITAL_COMMUNITY)
Admission: EM | Admit: 2022-09-02 | Discharge: 2022-09-04 | Disposition: A | Discharge status: Home / Self Care | Payer: PPO | Attending: Emergency Medicine | Admitting: Emergency Medicine

## 2022-09-02 ENCOUNTER — Other Ambulatory Visit: Payer: Self-pay

## 2022-09-02 DIAGNOSIS — M1611 Unilateral primary osteoarthritis, right hip: Secondary | ICD-10-CM | POA: Insufficient documentation

## 2022-09-02 DIAGNOSIS — R531 Weakness: Secondary | ICD-10-CM | POA: Diagnosis not present

## 2022-09-02 DIAGNOSIS — R410 Disorientation, unspecified: Secondary | ICD-10-CM | POA: Diagnosis not present

## 2022-09-02 DIAGNOSIS — R001 Bradycardia, unspecified: Secondary | ICD-10-CM | POA: Diagnosis not present

## 2022-09-02 DIAGNOSIS — R4182 Altered mental status, unspecified: Secondary | ICD-10-CM | POA: Diagnosis not present

## 2022-09-02 DIAGNOSIS — W19XXXA Unspecified fall, initial encounter: Secondary | ICD-10-CM | POA: Diagnosis not present

## 2022-09-02 DIAGNOSIS — I959 Hypotension, unspecified: Secondary | ICD-10-CM | POA: Diagnosis not present

## 2022-09-02 LAB — BLOOD GAS, VENOUS
Acid-Base Excess: 2.5 mmol/L — ABNORMAL HIGH (ref 0.0–2.0)
Bicarbonate: 27.9 mmol/L (ref 20.0–28.0)
O2 Saturation: 84 %
Patient temperature: 36.1
pCO2, Ven: 43 mmHg — ABNORMAL LOW (ref 44–60)
pH, Ven: 7.41 (ref 7.25–7.43)
pO2, Ven: 47 mmHg — ABNORMAL HIGH (ref 32–45)

## 2022-09-02 LAB — COMPREHENSIVE METABOLIC PANEL
ALT: 20 U/L (ref 0–44)
AST: 36 U/L (ref 15–41)
Albumin: 3.8 g/dL (ref 3.5–5.0)
Alkaline Phosphatase: 61 U/L (ref 38–126)
Anion gap: 7 (ref 5–15)
BUN: 20 mg/dL (ref 8–23)
CO2: 26 mmol/L (ref 22–32)
Calcium: 9.4 mg/dL (ref 8.9–10.3)
Chloride: 106 mmol/L (ref 98–111)
Creatinine, Ser: 0.91 mg/dL (ref 0.44–1.00)
GFR, Estimated: >60 mL/min (ref >60)
Glucose, Bld: 84 mg/dL (ref 70–99)
Potassium: 4.9 mmol/L (ref 3.5–5.1)
Sodium: 139 mmol/L (ref 135–145)
Total Bilirubin: 0.5 mg/dL (ref 0.3–1.2)
Total Protein: 7.7 g/dL (ref 6.5–8.1)

## 2022-09-02 LAB — URINALYSIS, ROUTINE W REFLEX MICROSCOPIC
Bilirubin Urine: NEGATIVE
Glucose, UA: NEGATIVE mg/dL
Hgb urine dipstick: NEGATIVE
Ketones, ur: NEGATIVE mg/dL
Leukocytes,Ua: NEGATIVE
Nitrite: NEGATIVE
Protein, ur: NEGATIVE mg/dL
Specific Gravity, Urine: 1.012 (ref 1.005–1.030)
pH: 5 (ref 5.0–8.0)

## 2022-09-02 LAB — ETHANOL: Alcohol, Ethyl (B): <10 mg/dL (ref <10)

## 2022-09-02 LAB — CBC WITH DIFFERENTIAL/PLATELET
Abs Immature Granulocytes: 0.01 10*3/uL (ref 0.00–0.07)
Basophils Absolute: 0 10*3/uL (ref 0.0–0.1)
Basophils Relative: 0 %
Eosinophils Absolute: 0.1 10*3/uL (ref 0.0–0.5)
Eosinophils Relative: 2 %
HCT: 34.5 % — ABNORMAL LOW (ref 36.0–46.0)
Hemoglobin: 11.2 g/dL — ABNORMAL LOW (ref 12.0–15.0)
Immature Granulocytes: 0 %
Lymphocytes Relative: 24 %
Lymphs Abs: 1.1 10*3/uL (ref 0.7–4.0)
MCH: 31.4 pg (ref 26.0–34.0)
MCHC: 32.5 g/dL (ref 30.0–36.0)
MCV: 96.6 fL (ref 80.0–100.0)
Monocytes Absolute: 0.5 10*3/uL (ref 0.1–1.0)
Monocytes Relative: 11 %
Neutro Abs: 3 10*3/uL (ref 1.7–7.7)
Neutrophils Relative %: 63 %
Platelets: 169 10*3/uL (ref 150–400)
RBC: 3.57 MIL/uL — ABNORMAL LOW (ref 3.87–5.11)
RDW: 13 % (ref 11.5–15.5)
WBC: 4.7 10*3/uL (ref 4.0–10.5)
nRBC: 0 % (ref 0.0–0.2)

## 2022-09-02 LAB — AMMONIA: Ammonia: 35 umol/L (ref 9–35)

## 2022-09-02 LAB — LACTIC ACID, PLASMA: Lactic Acid, Venous: 0.9 mmol/L (ref 0.5–1.9)

## 2022-09-02 MED ORDER — ACETAMINOPHEN 500 MG PO TABS: 500.0000 mg | ORAL_TABLET | Freq: Four times a day (QID) | ORAL | Status: DC | PRN

## 2022-09-02 MED ORDER — BUPROPION HCL ER (XL) 150 MG PO TB24
300.0000 mg | ORAL_TABLET | Freq: Every day | ORAL | Status: DC
  Administered 2022-09-02 – 2022-09-04 (×3): 300 mg via ORAL
  Filled 2022-09-02 (×3): qty 2

## 2022-09-02 MED ORDER — GABAPENTIN 100 MG PO CAPS
100.0000 mg | ORAL_CAPSULE | Freq: Every day | ORAL | Status: DC
  Administered 2022-09-02 – 2022-09-03 (×2): 100 mg via ORAL
  Filled 2022-09-02 (×2): qty 1

## 2022-09-02 MED ORDER — DOXAZOSIN MESYLATE 4 MG PO TABS
4.0000 mg | ORAL_TABLET | Freq: Every day | ORAL | Status: DC
  Administered 2022-09-02 – 2022-09-03 (×2): 4 mg via ORAL
  Filled 2022-09-02 (×3): qty 1

## 2022-09-02 MED ORDER — DONEPEZIL HCL 5 MG PO TABS
10.0000 mg | ORAL_TABLET | Freq: Every day | ORAL | Status: DC
  Administered 2022-09-02 – 2022-09-03 (×2): 10 mg via ORAL
  Filled 2022-09-02 (×2): qty 2

## 2022-09-02 MED ORDER — SODIUM CHLORIDE 0.9 % IV BOLUS
1000.0000 mL | Freq: Once | INTRAVENOUS | Status: AC
  Administered 2022-09-02: 1000 mL via INTRAVENOUS

## 2022-09-02 MED ORDER — AMLODIPINE BESYLATE 5 MG PO TABS
10.0000 mg | ORAL_TABLET | Freq: Every day | ORAL | Status: DC
  Administered 2022-09-02 – 2022-09-04 (×3): 10 mg via ORAL
  Filled 2022-09-02 (×3): qty 2

## 2022-09-02 NOTE — Progress Notes (Signed)
Patient has been faxed out. Bed offers pending. PASRR pending.

## 2022-09-02 NOTE — Progress Notes (Addendum)
Transition of Care [facility]) - Emergency Department Mini Assessment   Patient Details  Name: Pam Knapp MRN: [id number] Date of Birth: 28-Jun-1946  [facility]) CM/SW Contact:    Kimber Relic, LCSW Phone Number: 09/02/2022, 1:23 PM   Clinical Narrative: Pt presents to ED with increased weakness and frequent falls within the past week. This CSW contacted the pt's daughter, [name], who is also POA/HCPOA, to discuss reason for not wanting to pick her mother up. [name] states she has all the necessary precautionary measures in place at home to assist her in caring for her mother; however, states that recently the pt has increasingly become weak and fallen 7 times within the past week. [name] states she has been working with her mothers' PCP, Education officer, museum at [employer], and Education officer, museum at [employer] (where her mother goes to the day program under a grant through [employer]) to receive assistance in placing her mother long term. [name] states she is the only physical support and it is becoming overwhelming as she is unable to lift her mother in and out of bed/off the floor when she falls. [name] states that she has researched HH/ private duty aids but has not moved forward due to not having the financial resources to pay. [name] also is adamant that she is unable to pay privately for her mother to go to snf for 30 days or Respite Care. Per [name], her mother received 7 days of Respite Care under a grant through [place]. [name] states they are new to the area after moving here one year ago from [place]. This CSW informed [name] that we can attempt to place her mother for short term rehab at [facility]. This CSW explained the process and informed that there is not a guarantee that [facility] would approve a STR stay for her mother. [name] states she understands the SNF work up process and that if [facility] does not approve, her mother would return home possibly with [facility] through [facility]. This CSW also referred  the pt to [facility] and contacted Placido Sou, who is the rep and will contact the daughter to complete a financial assessment and discuss placement goals/options. This CSW asked Dr. Tyrone Nine to place PT orders. TOC following for PT recommendation.    ED Mini Assessment: What brought you to the Emergency Department? : increased weakness/frequent falls  Barriers to Discharge: No Barriers Identified     Means of departure: Not know       Patient Contact and Communications Key Contact 1: [name] (daughter/POA) - [phone]   Spoke with: [name] (daughter/POA) Contact Date: 09/02/22,   Contact time: 3295 Contact Phone Number: [phone] Call outcome: Unable to care for pt in the home    CMS Medicare.gov Compare Post Acute Care list provided to:: Patient Represenative (must comment) (daughter/POA) Choice offered to / list presented to : [facility] POA / Guardian, Adult Children  Admission diagnosis:  generalized weakness Patient Active Problem List   Diagnosis Date Noted   Mixed hyperlipidemia 05/08/2022   Late onset Alzheimer's disease without behavioral disturbance ([place]) 05/08/2022   Unsteady gait 05/08/2022   Need for Tdap vaccination 05/08/2022   Impacted cerumen of left ear 05/08/2022   Carpal tunnel syndrome, left upper limb 07/08/2020   Carpal tunnel syndrome, right upper limb 07/08/2020   Alzheimers disease ([place]) 02/18/2019   Polymyalgia rheumatica ([place]) 12/06/2018   Body mass index 28.0-28.9, adult 12/06/2018   Essential hypertension 12/06/2018   Visual disturbance 11/18/2018   Attention deficit  11/18/2018   Depression 11/18/2018   PCP:  Sandrea Hughs, NP Pharmacy:   [id number] [place], [place] [address]Phone: [phone]: [phone] [phone] [place] [address] [place]Phone:  [phone]: [phone]

## 2022-09-02 NOTE — ED Provider Notes (Signed)
  Physical Exam  BP (!) 150/81 (BP Location: Right Arm)   Pulse 68   Temp 97.8 F (36.6 C) (Oral)   Resp 15   Ht '5\' 9"'$  (1.753 m)   Wt 81.7 kg   SpO2 100%   BMI 26.58 kg/m   Physical Exam  Procedures  Procedures  ED Course / MDM    Medical Decision Making Care assumed at 3 PM.  Patient is here after a fall.  Came here for fall yesterday.  Patient has no admittable diagnosis.  Recommend discharge.  Family unable to care for her at home.  Social work and PT OT were involved.  They recommend inpatient rehab.  Patient is now pending placement at a rehab facility.  I have ordered home meds.  Amount and/or Complexity of Data Reviewed Labs: ordered. Radiology: ordered.  Risk OTC drugs. Prescription drug management.          Drenda Freeze, MD 09/02/22 [phone]

## 2022-09-02 NOTE — ED Provider Notes (Addendum)
[facility] DEPT Provider Note   CSN: [id number] Arrival date & time: 09/02/22  0734     History  Chief Complaint  Patient presents with   Weakness    Pam Knapp is a 76 y.o. female.  76 yo F with a cc of weakness.  Multiple recent falls.  Patient demented and is unable to provide any history, level V caveat.    Weakness      Home Medications Prior to Admission medications   Medication Sig Start Date End Date Taking? Authorizing Provider  acetaminophen (TYLENOL) 500 MG tablet Take 500 mg by mouth every 6 (six) hours as needed. 1-2 PRN    [provider]  amLODipine (NORVASC) 10 MG tablet TAKE 1 TABLET BY MOUTH EVERY DAY 06/07/22   [name], Dinah C, NP  brimonidine-timolol (COMBIGAN) 0.2-0.5 % ophthalmic solution Place 1 drop into both eyes every 12 (twelve) hours.    [provider]  buPROPion (WELLBUTRIN XL) 300 MG 24 hr tablet TAKE 1 TABLET BY MOUTH EVERY DAY 03/03/22   [name], Dinah C, NP  cetirizine (ZYRTEC) 10 MG tablet Take 10 mg by mouth daily.    [provider]  Cholecalciferol (VITAMIN D) 125 MCG (5000 UT) CAPS Take 1 capsule by mouth daily.    [provider]  Cranberry-Cholecalciferol 4200-500 MG-UNIT CAPS Take 1 capsule by mouth daily.    [provider]  diclofenac Sodium (VOLTAREN) 1 % GEL Apply 2 g topically 4 (four) times daily. 01/27/21   [name], Dinah C, NP  donepezil (ARICEPT) 10 MG tablet Decrease from 10 mg tablet to 5 mg tablet daily x 1 week then stop. 09/05/21   Lomax, Amy, NP  doxazosin (CARDURA) 4 MG tablet TAKE 1 TABLET BY MOUTH EVERY DAY 08/21/22   [name], Dinah C, NP  gabapentin (NEURONTIN) 100 MG capsule TAKE 1 CAPSULE BY MOUTH AT BEDTIME. 10/04/21   [name], Dinah C, NP  Latanoprostene Bunod (VYZULTA) 0.024 % SOLN Apply to eye daily.    [provider]  Loperamide HCl (IMODIUM PO) Take by mouth at bedtime.    [provider]  meloxicam (MOBIC)  7.5 MG tablet TAKE 1 TABLET BY MOUTH EVERY DAY 05/24/22   [name], Dinah C, NP  memantine (NAMENDA) 10 MG tablet Take 1 tablet (10 mg total) by mouth 2 (two) times daily. 06/29/22   Lomax, Amy, NP  Omega-3 Fatty Acids (OMEGA 3 PO) Take 750 mg by mouth daily.    [provider]  QUEtiapine (SEROQUEL) 25 MG tablet Take 1 tablet (25 mg total) by mouth 2 (two) times daily. 07/04/22   [name], Dinah C, NP  spironolactone (ALDACTONE) 25 MG tablet TAKE 1 TABLET BY MOUTH TWICE A DAY 08/21/22   [name], Dinah C, NP  Turmeric (QC TUMERIC COMPLEX PO) Take 15 mLs by mouth daily.    [provider]  vitamin C (ASCORBIC ACID) 500 MG tablet Take 500 mg by mouth daily.    [provider]      Allergies    Patient has no known allergies.    Review of Systems   Review of Systems  Neurological:  Positive for weakness.    Physical Exam Updated Vital Signs BP (!) 151/80 (BP Location: Right Arm)   Pulse 72   Temp (!) 97.4 F (36.3 C) (Oral)   Resp (!) 22   Ht '5\' 9"'$  (1.753 m)   Wt 81.7 kg   SpO2 100%   BMI 26.58 kg/m  Physical  Exam Vitals and nursing note reviewed.  Constitutional:      General: She is not in acute distress.    Appearance: She is well-developed. She is not diaphoretic.  HENT:     Head: Normocephalic and atraumatic.  Eyes:     Pupils: Pupils are equal, round, and reactive to light.  Cardiovascular:     Rate and Rhythm: Normal rate and regular rhythm.     Heart sounds: No murmur heard.    No friction rub. No gallop.  Pulmonary:     Effort: Pulmonary effort is normal.     Breath sounds: No wheezing or rales.  Abdominal:     General: There is no distension.     Palpations: Abdomen is soft.     Tenderness: There is no abdominal tenderness.  Musculoskeletal:        General: No tenderness.     Cervical back: Normal range of motion and neck supple.  Skin:    General: Skin is warm and dry.  Neurological:     Mental Status: She is alert.     Comments:  Moving all 4 extremities spontaneously.  Has some difficulty following commands but will with continued prompting.     ED Results / Procedures / Treatments   Labs (all labs ordered are listed, but only abnormal results are displayed) Labs Reviewed  BLOOD GAS, VENOUS - Abnormal; Notable for the following components:      Result Value   pCO2, Ven 43 (*)    pO2, Ven 47 (*)    Acid-Base Excess 2.5 (*)    All other components within normal limits  CBC WITH DIFFERENTIAL/PLATELET - Abnormal; Notable for the following components:   RBC 3.57 (*)    Hemoglobin 11.2 (*)    HCT 34.5 (*)    All other components within normal limits  URINALYSIS, ROUTINE W REFLEX MICROSCOPIC  COMPREHENSIVE METABOLIC PANEL  LACTIC ACID, PLASMA  ETHANOL  AMMONIA    EKG EKG Interpretation  Date/Time:  Saturday September 02 2022 09:25:14 EST Ventricular Rate:  74 PR Interval:  154 QRS Duration: 100 QT Interval:  401 QTC Calculation: 445 R Axis:   -34 Text Interpretation: Sinus rhythm Left axis deviation Low voltage, precordial leads No significant change since last tracing Confirmed by Deno Etienne [phone]) on 09/02/2022 10:23:39 AM  Radiology DG Chest Port 1 View  Result Date: 09/02/2022 CLINICAL DATA:  76 year old female with altered mental status, confusion. Increased weakness. Multiple falls. EXAM: PORTABLE CHEST 1 VIEW COMPARISON:  Portable chest 06/28/2022 and earlier. FINDINGS: Portable AP semi upright view at 0857 hours. Stable contour of mildly tortuous thoracic aorta. Cardiac size is stable at the upper limits of normal. Other mediastinal contours are within normal limits. Visualized tracheal air column is within normal limits. Allowing for portable technique the lungs are clear. No pneumothorax or pleural effusion. No acute osseous abnormality identified. IMPRESSION: No acute cardiopulmonary abnormality. Electronically Signed   By: Genevie Ann M.D.   On: 09/02/2022 09:13   DG Hip Unilat With Pelvis 2-3  Views Left  Result Date: 09/01/2022 CLINICAL DATA:  Fall out of bed onto left side. EXAM: DG HIP (WITH OR WITHOUT PELVIS) 2-3V LEFT COMPARISON:  11/20/2007 FINDINGS: Left total hip arthroplasty is intact and normally located. Mild osteoarthritic change of the right hip. Mild degenerate change of the symphysis pubis joint and sacroiliac joints. Degenerative changes of the spine. IMPRESSION: 1. No acute findings. 2. Left total hip arthroplasty intact and normally located. Electronically Signed  By: Marin Olp M.D.   On: 09/01/2022 08:10   DG Shoulder Left  Result Date: 09/01/2022 CLINICAL DATA:  Fall out of bed onto left side. EXAM: LEFT SHOULDER - 2+ VIEW COMPARISON:  None Available. FINDINGS: Minimal degenerative changes over the [facility] joint and glenohumeral joints. No acute fracture or dislocation. IMPRESSION: 1. No acute findings. 2. Minimal degenerative changes. Electronically Signed   By: Marin Olp M.D.   On: 09/01/2022 08:08   CT Head Wo Contrast  Result Date: 09/01/2022 CLINICAL DATA:  Trauma.  Fall. EXAM: CT HEAD WITHOUT CONTRAST CT CERVICAL SPINE WITHOUT CONTRAST TECHNIQUE: Multidetector CT imaging of the head and cervical spine was performed following the standard protocol without intravenous contrast. Multiplanar CT image reconstructions of the cervical spine were also generated. RADIATION DOSE REDUCTION: This exam was performed according to the departmental dose-optimization program which includes automated exposure control, adjustment of the mA and/or kV according to patient size and/or use of iterative reconstruction technique. COMPARISON:  06/28/2022. FINDINGS: CT HEAD FINDINGS Brain: No evidence of acute infarction, hemorrhage, hydrocephalus, extra-axial collection or mass lesion/mass effect. Remote left basal ganglia lacunar infarct. Vascular: No hyperdense vessel or unexpected calcification. Skull: Normal. Negative for fracture or focal lesion. Sinuses/Orbits: Paranasal sinuses and  mastoid air cells are clear. Other: None. CT CERVICAL SPINE FINDINGS Alignment: Normal. Skull base and vertebrae: No acute fracture. No primary bone lesion or focal pathologic process. Soft tissues and spinal canal: No prevertebral fluid or swelling. No visible canal hematoma. Disc levels: Solid fusion of C7, T1 and T2. Disc space narrowing and endplate spurring noted at C3-4, C4-5, C5-6 and C6-7. Upper chest: No acute findings.  Centrilobular emphysema. Other: None IMPRESSION: 1. No acute intracranial abnormality. 2. Remote left basal ganglia lacunar infarct. 3. No evidence for cervical spine fracture or subluxation. 4. Cervical degenerative disc disease. 5. Solid fusion of C7, T1 and T2. 6.  Emphysema (ICD10-J43.9). Electronically Signed   By: Kerby Moors M.D.   On: 09/01/2022 07:59   CT Cervical Spine Wo Contrast  Result Date: 09/01/2022 CLINICAL DATA:  Trauma.  Fall. EXAM: CT HEAD WITHOUT CONTRAST CT CERVICAL SPINE WITHOUT CONTRAST TECHNIQUE: Multidetector CT imaging of the head and cervical spine was performed following the standard protocol without intravenous contrast. Multiplanar CT image reconstructions of the cervical spine were also generated. RADIATION DOSE REDUCTION: This exam was performed according to the departmental dose-optimization program which includes automated exposure control, adjustment of the mA and/or kV according to patient size and/or use of iterative reconstruction technique. COMPARISON:  06/28/2022. FINDINGS: CT HEAD FINDINGS Brain: No evidence of acute infarction, hemorrhage, hydrocephalus, extra-axial collection or mass lesion/mass effect. Remote left basal ganglia lacunar infarct. Vascular: No hyperdense vessel or unexpected calcification. Skull: Normal. Negative for fracture or focal lesion. Sinuses/Orbits: Paranasal sinuses and mastoid air cells are clear. Other: None. CT CERVICAL SPINE FINDINGS Alignment: Normal. Skull base and vertebrae: No acute fracture. No primary  bone lesion or focal pathologic process. Soft tissues and spinal canal: No prevertebral fluid or swelling. No visible canal hematoma. Disc levels: Solid fusion of C7, T1 and T2. Disc space narrowing and endplate spurring noted at C3-4, C4-5, C5-6 and C6-7. Upper chest: No acute findings.  Centrilobular emphysema. Other: None IMPRESSION: 1. No acute intracranial abnormality. 2. Remote left basal ganglia lacunar infarct. 3. No evidence for cervical spine fracture or subluxation. 4. Cervical degenerative disc disease. 5. Solid fusion of C7, T1 and T2. 6.  Emphysema (ICD10-J43.9). Electronically Signed   By: Lovena Le  Clovis Riley M.D.   On: 09/01/2022 07:59    Procedures Procedures    Medications Ordered in ED Medications  sodium chloride 0.9 % bolus 1,000 mL (1,000 mLs Intravenous New Bag/Given 09/02/22 1610)    ED Course/ Medical Decision Making/ A&P                           Medical Decision Making Amount and/or Complexity of Data Reviewed Labs: ordered. Radiology: ordered.   76 yo F with a chief complaints of increasing weakness.  The patient was just seen yesterday in the emergency department for a fall.  Was sent here via EMS for worsening weakness and increased falls.  On my record review the patient has been having frequent falls per her primary care doctor note.  They have been trying to get her into a skilled nursing facility since the end of September.  [facility] does go to [facility] the day.  She is unable to provide any history.  I reviewed the imaging from yesterday, had a negative head CT and negative CT of the C-spine for acute pathology.  Will obtain a laboratory evaluation today.  UA chest x-ray.  Reassess.  No obvious concerning finding on patient's workup here.  Patient is not hypercarbic her ammonia level is normal no significant electrolyte abnormality no significant leukocytosis no significant anemia.  Chest x-ray independently interpreted by me without focal infiltrate or  pneumothorax.  UA negative for infection.  CT of the head without acute finding.  Will discharge patient home.  PCP follow-up.  I was notified by nursing that the family was concerned about letting the patient go home.  On my record review it seems that this was a concern at their last visit yesterday, at that time they also did not visit the patient in the emergency department stating that he had refused transport to later in the evening.  I consulted social work.  They plan to have her evaluated for possible skilled nursing facility placement. Medications given during this visit Medications  sodium chloride 0.9 % bolus 1,000 mL (1,000 mLs Intravenous New Bag/Given 09/02/22 9604)     The patient appears reasonably screen and/or stabilized for discharge and I doubt any other medical condition or other [facility] requiring further screening, evaluation, or treatment in the ED at this time prior to discharge.          Final Clinical Impression(s) / ED Diagnoses Final diagnoses:  Weakness    Rx / DC Orders ED Discharge Orders     None         Deno Etienne, DO 09/02/22 [place], [place], DO 09/02/22 1255

## 2022-09-02 NOTE — Evaluation (Signed)
Occupational Therapy Evaluation Patient Details Name: Pam Knapp MRN: [id number] DOB: [birth year] Today's Date: 09/02/2022   History of Present Illness Patient is a 76 year old female who presented to the hospital for 2nd day in row after fall at home. Patient was noted in chart for 7 falls at home within the past week. PMH: mixed hyperlipidemia, alzheimer's disease, polymyalgia rheumatica, depression, attention deficit, visual disturbance.   Clinical Impression   Patient is a 76 year old female who was admitted for above. Patient's PLOF is questionable with patients noted confusion during session. No family in room at time of session. Per chart review patient lived at home with daughter support prior level. Patient needed min A For standing balance with noted scissoring of gait and LOB with turning. Patient was noted to have had multiple falls in the last week per chart review. Patient was noted to have decreased functional activity tolerance, decreased endurance, decreased standing balance, decreased safety awareness, and decreased knowledge of AD/AE impacting participation in ADLs. Patient would continue to benefit from skilled OT services at this time while admitted and after d/c to address noted deficits in order to improve overall safety and independence in ADLs.        Recommendations for follow up therapy are one component of a multi-disciplinary discharge planning process, led by the attending physician.  Recommendations may be updated based on patient status, additional functional criteria and insurance authorization.   Follow Up Recommendations  Skilled nursing-short term rehab (<3 hours/day)     Assistance Recommended at Discharge Frequent or constant Supervision/Assistance  Patient can return home with the following A little help with walking and/or transfers;A little help with bathing/dressing/bathroom;Help with stairs or ramp for entrance;Assistance with  cooking/housework;Assist for transportation;Direct supervision/assist for financial management;Direct supervision/assist for medications management    Functional Status Assessment  Patient has had a recent decline in their functional status and demonstrates the ability to make significant improvements in function in a reasonable and predictable amount of time.  Equipment Recommendations  None recommended by OT    Recommendations for Other Services       Precautions / Restrictions Precautions Precautions: Fall Restrictions Weight Bearing Restrictions: No      Mobility Bed Mobility Overal bed mobility: Needs Assistance Bed Mobility: Supine to Sit, Sit to Supine     Supine to sit: Min assist Sit to supine: Mod assist   General bed mobility comments: patient needed physical assistance to get BLE back into bed. provided with cues for sequencing of both tasks.    Transfers                          Balance Overall balance assessment: Needs assistance Sitting-balance support: No upper extremity supported Sitting balance-Leahy Scale: Fair     Standing balance support: During functional activity, Single extremity supported Standing balance-Leahy Scale: Poor Standing balance comment: needed one UE support for standing balance                           ADL either performed or assessed with clinical judgement   ADL Overall ADL's : Needs assistance/impaired Eating/Feeding: Set up   Grooming: Sitting;Minimal assistance   Upper Body Bathing: Minimal assistance;Sitting   Lower Body Bathing: Sitting/lateral leans;Maximal assistance   Upper Body Dressing : Minimal assistance;Sitting   Lower Body Dressing: Sitting/lateral leans;Maximal assistance   Toilet Transfer: Minimal assistance Toilet Transfer Details (indicate cue type and  reason): with HHA to navigate in room with noted scissioring with turns and LOB with min A to maintain standing at times.  patient is plesant and cooperative during session. Toileting- Clothing Manipulation and Hygiene: Moderate assistance;Sit to/from stand       Functional mobility during ADLs: Minimal assistance General ADL Comments: with HHA. patient ignored the RW when placed infront of patient. noted to have scissoring of gait during turning.     Vision   Additional Comments: patient was noted to have increased difficulty with identification of items and colors in room. able to see gross objects without bumping into them during room navigation. able to track voices with increased time.     Perception     Praxis      Pertinent Vitals/Pain Pain Assessment Pain Assessment: No/denies pain     Hand Dominance     Extremity/Trunk Assessment Upper Extremity Assessment Upper Extremity Assessment: Generalized weakness (ROM WFL, had a hard time following cues for MMT during session)   Lower Extremity Assessment Lower Extremity Assessment: Defer to PT evaluation       Communication     Cognition Arousal/Alertness: Awake/alert Behavior During Therapy: Flat affect Overall Cognitive Status: Difficult to assess                                 General Comments: patient was oriented to self. patient reported  " i hope this is school" when asked if she was at school or the hospital. patient needed increased cues for safety during session.     General Comments       Exercises     Shoulder Instructions      Home Living Family/patient expects to be discharged to:: Private residence   Available Help at Discharge: Family                             Additional Comments: patient was unable to provide PLOF with significant confusion noted during session. no family in room at time of eval. per chart review, patient lives at home with daughter.      Prior Functioning/Environment Prior Level of Function : Independent/Modified Independent                        OT  Problem List: Decreased activity tolerance;Decreased cognition;Impaired balance (sitting and/or standing);Decreased knowledge of use of DME or AE;Decreased safety awareness      OT Treatment/Interventions: Self-care/ADL training;Therapeutic activities;Neuromuscular education;DME and/or AE instruction;Patient/family education;Balance training    OT Goals(Current goals can be found in the care plan section) Acute Rehab OT Goals Patient Stated Goal: none stated OT Goal Formulation: Patient unable to participate in goal setting Time For Goal Achievement: 09/16/22 Potential to Achieve Goals: Fair  OT Frequency: Min 2X/week    Co-evaluation PT/OT/SLP Co-Evaluation/Treatment: Yes Reason for Co-Treatment: For patient/therapist safety;To address functional/ADL transfers PT goals addressed during session: Mobility/safety with mobility OT goals addressed during session: ADL's and self-care      AM-PAC OT "6 Clicks" Daily Activity     Outcome Measure Help from another person eating meals?: A Little Help from another person taking care of personal grooming?: A Little Help from another person toileting, which includes using toliet, bedpan, or urinal?: A Lot Help from another person bathing (including washing, rinsing, drying)?: A Lot Help from another person to put on and taking off regular  upper body clothing?: A Lot Help from another person to put on and taking off regular lower body clothing?: A Lot 6 Click Score: 14   End of Session Equipment Utilized During Treatment: Gait belt Nurse Communication: Mobility status  Activity Tolerance: Patient tolerated treatment well Patient left: in bed;with call bell/phone within reach;with bed alarm set  OT Visit Diagnosis: Unsteadiness on feet (R26.81);Other abnormalities of gait and mobility (R26.89);Repeated falls (R29.6);Muscle weakness (generalized) (M62.81);History of falling (Z91.81)                Time: 9480-1655 OT Time Calculation (min): 24  min Charges:  OT General Charges $OT Visit: 1 Visit OT Evaluation $OT Eval Moderate Complexity: 1 Mod  Asiel Chrostowski OTR/L, MS Acute Rehabilitation Department Office# [phone]   Willa Rough 09/02/2022, 2:51 PM

## 2022-09-02 NOTE — Discharge Instructions (Signed)
We did not find an obvious cause for your confusion or weakness here in the emergency department.  Please follow-up with your family doctor in the office.  They may want to review your medications or perhaps recommend physical therapy.  Please return to the emergency department for worsening or changing symptoms.

## 2022-09-02 NOTE — Progress Notes (Signed)
[facility] Note   Patient Details  Name: Pam Knapp Date of Birth: 07/18/1946   Transition of Care [facility]) CM/SW Contact:    Kimber Relic, LCSW Phone Number: 09/02/2022, 3:45 PM  To Whom It May Concern:  Please be advised that this patient will require a short-term nursing home stay - anticipated 30 days or less for rehabilitation and strengthening.   The plan is for return home.

## 2022-09-02 NOTE — Evaluation (Signed)
Physical Therapy Evaluation Patient Details Name: Pam Knapp MRN: [id number] DOB: Apr 23, 1946 Today's Date: 09/02/2022  History of Present Illness  Patient is a 77 year old female who presented to the hospital 09/02/22 for 2nd day in row after fall at home. Patient was noted in chart for 7 falls at home within the past week. PMH: mixed hyperlipidemia, alzheimer's disease, polymyalgia rheumatica, depression, attention deficit, visual disturbance.  Clinical Impression  Pt admitted with above diagnosis. Pt from home with daughter with several recent falls and ED admissions due to falls.  Per chart , daughter no longer comfortable caring for pt at this time due to falls.  Today, no family present at time of evaluation.  Pt very confused, only oriented to self, requiring multimodal cues/gestures for basic commands - suspect baseline cognition from dementia.  She required mod cues with all transfers, mod A bed mobility, min A to stand and ambulate 65' with HHA.  Pt was not able to use RW despite cues.  Due to recent falls - do recommend SNF; however, may be limited by dementia and likely needs transition to LTC. Pt currently with functional limitations due to the deficits listed below (see PT Problem List). Pt will benefit from skilled PT to increase their independence and safety with mobility to allow discharge to the venue listed below.          Recommendations for follow up therapy are one component of a multi-disciplinary discharge planning process, led by the attending physician.  Recommendations may be updated based on patient status, additional functional criteria and insurance authorization.  Follow Up Recommendations Skilled nursing-short term rehab (<3 hours/day) (likely needs transition to LTC) Can patient physically be transported by private vehicle: Yes    Assistance Recommended at Discharge Frequent or constant Supervision/Assistance  Patient can return home with the following  A  little help with walking and/or transfers;A little help with bathing/dressing/bathroom;Assistance with cooking/housework    Equipment Recommendations None recommended by PT  Recommendations for Other Services       Functional Status Assessment Patient has had a recent decline in their functional status and demonstrates the ability to make significant improvements in function in a reasonable and predictable amount of time.     Precautions / Restrictions Precautions Precautions: Fall Restrictions Weight Bearing Restrictions: No      Mobility  Bed Mobility Overal bed mobility: Needs Assistance Bed Mobility: Supine to Sit, Sit to Supine     Supine to sit: Min assist Sit to supine: Mod assist   General bed mobility comments: patient needed physical assistance to get BLE back into bed. provided with cues for sequencing of both tasks.    Transfers Overall transfer level: Needs assistance Equipment used: 1 person hand held assist Transfers: Sit to/from Stand Sit to Stand: Min assist           General transfer comment: RW in front of pt but pt not using even with multimodal cues; stood with HHA and min A to steady    Ambulation/Gait Ambulation/Gait assistance: Min assist Gait Distance (Feet): 70 Feet Assistive device: 1 person hand held assist Gait Pattern/deviations: Step-through pattern, Decreased stride length Gait velocity: decreased     General Gait Details: Pt with short steps; required HHA to guide and steady; no LOB during session but with low foot clearance, confusion, and suspected decreased vision (squinting when asked what color therapist was wearing) fall risk  Soil scientist  Mobility    Modified Rankin (Stroke Patients Only)       Balance Overall balance assessment: Needs assistance, History of Falls Sitting-balance support: No upper extremity supported Sitting balance-Leahy Scale: Fair     Standing balance support: During  functional activity, Single extremity supported Standing balance-Leahy Scale: Poor Standing balance comment: needed one UE support for standing balance                             Pertinent Vitals/Pain Pain Assessment Pain Assessment: No/denies pain    Home Living Family/patient expects to be discharged to:: Private residence Living Arrangements: Children Available Help at Discharge: Family               Additional Comments: Patient was unable to provide PLOF with significant confusion noted during session. No family in room at time of eval. Per chart review, patient lives at home with daughter and daughter has been stuggling lately to provide care needed due to frequent falls and declining mobility/cognition    Prior Function Prior Level of Function : Needs assist             Mobility Comments: unknown PLOF; per chart lives with daughter and daughter assist; pt with frequent recent falls       Hand Dominance        Extremity/Trunk Assessment   Upper Extremity Assessment Upper Extremity Assessment: Defer to OT evaluation    Lower Extremity Assessment Lower Extremity Assessment: Difficult to assess due to impaired cognition;Generalized weakness (Unable to follow commands for MMT)    Cervical / Trunk Assessment Cervical / Trunk Assessment: Normal  Communication      Cognition Arousal/Alertness: Awake/alert Behavior During Therapy: Flat affect Overall Cognitive Status: No family/caregiver present to determine baseline cognitive functioning Area of Impairment: Orientation, Following commands, Safety/judgement, Awareness, Problem solving, Memory                 Orientation Level: Disoriented to, Place, Time, Situation   Memory: Decreased short-term memory Following Commands: Follows one step commands inconsistently Safety/Judgement: Decreased awareness of safety, Decreased awareness of deficits Awareness: Intellectual Problem Solving: Slow  processing, Decreased initiation, Difficulty sequencing, Requires verbal cues, Requires tactile cues General Comments: Pt with significant confusion - only oriented to self. She reports "I hope this is school" when asked if she was at school or the hospital.  She follows basic commands with gestures        General Comments      Exercises     Assessment/Plan    PT Assessment Patient needs continued PT services  PT Problem List Decreased strength;Decreased cognition;Decreased knowledge of use of DME;Decreased activity tolerance;Decreased safety awareness;Decreased balance;Decreased coordination;Decreased mobility       PT Treatment Interventions DME instruction;Balance training;Gait training;Cognitive remediation;Functional mobility training;Patient/family education;Therapeutic activities;Therapeutic exercise    PT Goals (Current goals can be found in the Care Plan section)  Acute Rehab PT Goals Patient Stated Goal: unable to state; per notes daughter unable to take home due to falls PT Goal Formulation: With patient Time For Goal Achievement: 09/16/22 Potential to Achieve Goals: Good    Frequency Min 2X/week     Co-evaluation PT/OT/SLP Co-Evaluation/Treatment: Yes Reason for Co-Treatment: For patient/therapist safety PT goals addressed during session: Mobility/safety with mobility OT goals addressed during session: ADL's and self-care       AM-PAC PT "6 Clicks" Mobility  Outcome Measure Help needed turning from your back to your side while in  a flat bed without using bedrails?: A Little Help needed moving from lying on your back to sitting on the side of a flat bed without using bedrails?: A Lot Help needed moving to and from a bed to a chair (including a wheelchair)?: A Little Help needed standing up from a chair using your arms (e.g., wheelchair or bedside chair)?: A Lot (min A mod cues) Help needed to walk in hospital room?: A Lot (min A mod cues) Help needed climbing  3-5 steps with a railing? : A Lot 6 Click Score: 14    End of Session Equipment Utilized During Treatment: Gait belt Activity Tolerance: Patient tolerated treatment well Patient left: in bed;with call bell/phone within reach;with bed alarm set Nurse Communication: Mobility status PT Visit Diagnosis: Other abnormalities of gait and mobility (R26.89);History of falling (Z91.81)    Time: 1347-1410 PT Time Calculation (min) (ACUTE ONLY): 23 min   Charges:   PT Evaluation $PT Eval Low Complexity: 1 Low          Annabel Gibeau, PT Acute Rehab [facility] [phone]   Karlton Lemon 09/02/2022, 3:46 PM

## 2022-09-02 NOTE — ED Notes (Signed)
1100 urine from suction canister emptied

## 2022-09-02 NOTE — NC FL2 (Signed)
[place] MEDICAID FL2 LEVEL OF CARE SCREENING TOOL     IDENTIFICATION  Patient Name: Pam Knapp Birthdate: June 08, 1946 Sex: female Admission Date (Current Location): 09/02/2022  [facility] and [place] Number:  Herbalist and Address:  [facility],  [place], [place]      Provider Number: [id number]  Attending Physician Name and Address:  Drenda Freeze, MD  Relative Name and Phone Number:  [place]) [phone]    Current Level of Care: Hospital Recommended Level of Care: [place] Prior Approval Number:    Date Approved/Denied:   PASRR Number: PENDING  Discharge Plan: SNF    Current Diagnoses: Patient Active Problem List   Diagnosis Date Noted   Mixed hyperlipidemia 05/08/2022   Late onset Alzheimer's disease without behavioral disturbance ([place]) 05/08/2022   Unsteady gait 05/08/2022   Need for Tdap vaccination 05/08/2022   Impacted cerumen of left ear 05/08/2022   Carpal tunnel syndrome, left upper limb 07/08/2020   Carpal tunnel syndrome, right upper limb 07/08/2020   Alzheimers disease ([place]) 02/18/2019   Polymyalgia rheumatica ([place]) 12/06/2018   Body mass index 28.0-28.9, adult 12/06/2018   Essential hypertension 12/06/2018   Visual disturbance 11/18/2018   Attention deficit 11/18/2018   Depression 11/18/2018    Orientation RESPIRATION BLADDER Height & Weight     Self  Normal Incontinent Weight: 180 lb 0.1 oz (81.7 kg) Height:  '5\' 9"'$  (175.3 cm)  BEHAVIORAL SYMPTOMS/MOOD NEUROLOGICAL BOWEL NUTRITION STATUS      Incontinent Diet (Regular)  AMBULATORY STATUS COMMUNICATION OF NEEDS Skin   Limited Assist Verbally Normal                       Personal Care Assistance Level of Assistance  Bathing, Feeding, Dressing Bathing Assistance: Limited assistance Feeding assistance: Independent Dressing Assistance: Limited assistance     Functional Limitations Info  Sight, Hearing,  Speech Sight Info: Adequate Hearing Info: Adequate Speech Info: Adequate    SPECIAL CARE FACTORS FREQUENCY  PT (By licensed PT), OT (By licensed OT)     PT Frequency: x5/week OT Frequency: x5/week            Contractures Contractures Info: Not present    Additional Factors Info  Code Status, Allergies, Psychotropic Code Status Info: Code Status not on file Allergies Info: None Psychotropic Info: None         Current Medications (09/02/2022):  This is the current hospital active medication list Current Facility-Administered Medications  Medication Dose Route Frequency Provider Last Rate Last Admin   acetaminophen (TYLENOL) tablet 500 mg  500 mg Oral Q6H PRN Drenda Freeze, MD       amLODipine (NORVASC) tablet 10 mg  10 mg Oral Daily Drenda Freeze, MD       buPROPion (WELLBUTRIN XL) 24 hr tablet 300 mg  300 mg Oral Daily Drenda Freeze, MD       donepezil (ARICEPT) tablet 10 mg  10 mg Oral QHS Drenda Freeze, MD       doxazosin (CARDURA) tablet 4 mg  4 mg Oral Daily Drenda Freeze, MD       gabapentin (NEURONTIN) capsule 100 mg  100 mg Oral QHS Drenda Freeze, MD       Current Outpatient Medications  Medication Sig Dispense Refill   acetaminophen (TYLENOL) 500 MG tablet Take 500 mg by mouth every 6 (six) hours as needed. 1-2 PRN  amLODipine (NORVASC) 10 MG tablet TAKE 1 TABLET BY MOUTH EVERY DAY 90 tablet 1   brimonidine-timolol (COMBIGAN) 0.2-0.5 % ophthalmic solution Place 1 drop into both eyes every 12 (twelve) hours.     buPROPion (WELLBUTRIN XL) 300 MG 24 hr tablet TAKE 1 TABLET BY MOUTH EVERY DAY 90 tablet 1   cetirizine (ZYRTEC) 10 MG tablet Take 10 mg by mouth daily.     Cholecalciferol (VITAMIN D) 125 MCG (5000 UT) CAPS Take 1 capsule by mouth daily.     Cranberry-Cholecalciferol 4200-500 MG-UNIT CAPS Take 1 capsule by mouth daily.     diclofenac Sodium (VOLTAREN) 1 % GEL Apply 2 g topically 4 (four) times daily. 100 g 3   donepezil  (ARICEPT) 10 MG tablet Decrease from 10 mg tablet to 5 mg tablet daily x 1 week then stop. 90 tablet 3   doxazosin (CARDURA) 4 MG tablet TAKE 1 TABLET BY MOUTH EVERY DAY 90 tablet 1   gabapentin (NEURONTIN) 100 MG capsule TAKE 1 CAPSULE BY MOUTH AT BEDTIME. 90 capsule 3   Latanoprostene Bunod (VYZULTA) 0.024 % SOLN Apply to eye daily.     Loperamide HCl (IMODIUM PO) Take by mouth at bedtime.     meloxicam (MOBIC) 7.5 MG tablet TAKE 1 TABLET BY MOUTH EVERY DAY 30 tablet 6   memantine (NAMENDA) 10 MG tablet Take 1 tablet (10 mg total) by mouth 2 (two) times daily. 180 tablet 3   Omega-3 Fatty Acids (OMEGA 3 PO) Take 750 mg by mouth daily.     QUEtiapine (SEROQUEL) 25 MG tablet Take 1 tablet (25 mg total) by mouth 2 (two) times daily. 90 tablet 1   spironolactone (ALDACTONE) 25 MG tablet TAKE 1 TABLET BY MOUTH TWICE A DAY 180 tablet 1   Turmeric (QC TUMERIC COMPLEX PO) Take 15 mLs by mouth daily.     vitamin C (ASCORBIC ACID) 500 MG tablet Take 500 mg by mouth daily.       Discharge Medications: Please see discharge summary for a list of discharge medications.  Relevant Imaging Results:  Relevant Lab Results:   Additional Information SSN: [id number]  Kimber Relic, LCSW

## 2022-09-02 NOTE — ED Triage Notes (Signed)
Pt BIBA from home. Pt has has increasing weakness for the past 1+ week. Has fallen 7 times since then. No trauma, no LOC, not on BT.  Hx alzheimer's. Aox3  BP: 150/82 HR: 70 SPO2: 100 RA CBG: 102

## 2022-09-03 LAB — CBG MONITORING, ED: Glucose-Capillary: 81 mg/dL (ref 70–99)

## 2022-09-03 NOTE — Progress Notes (Cosign Needed)
TOC Dementia Note   Patient Details  Name: Pam Knapp Date of Birth: May 13, 1946 09/03/2022, 8:41 AM   To Whom It May Concern:  Please be advised that the above-named patient has a primary diagnosis of dementia which supersedes any psychiatric diagnosis.   Transition of Care (TOC) CM/SW Contact: Kimber Relic, LCSW Phone Number: 09/03/2022, 8:41 AM

## 2022-09-03 NOTE — Progress Notes (Addendum)
Presented bed offers to the pt's daughter who has accepted [facility] for SNF. Auth submitted to [facility] for SNF and PTAR. PASRR still pending.   [facility] selected in [place].

## 2022-09-03 NOTE — Progress Notes (Signed)
Bed offers and PASRR pending.

## 2022-09-03 NOTE — Progress Notes (Addendum)
SNF approval - [id number] PTAR approval - [id number]   Good for 5 business days. Pt's daughter, [name], was notified.   This CSW will follow up with Raquel Sarna at [place] on tomorrow to confirm open bed.   Addend @ 5:20 PM Followed up with Raquel Sarna who directed this CSW to speak with Whitney. Whitney will follow up with this CSW in the morning.

## 2022-09-04 DIAGNOSIS — M353 Polymyalgia rheumatica: Secondary | ICD-10-CM | POA: Diagnosis not present

## 2022-09-04 DIAGNOSIS — H409 Unspecified glaucoma: Secondary | ICD-10-CM | POA: Diagnosis not present

## 2022-09-04 DIAGNOSIS — F32A Depression, unspecified: Secondary | ICD-10-CM | POA: Diagnosis not present

## 2022-09-04 DIAGNOSIS — R488 Other symbolic dysfunctions: Secondary | ICD-10-CM | POA: Diagnosis not present

## 2022-09-04 DIAGNOSIS — R531 Weakness: Secondary | ICD-10-CM | POA: Diagnosis not present

## 2022-09-04 DIAGNOSIS — I10 Essential (primary) hypertension: Secondary | ICD-10-CM | POA: Diagnosis not present

## 2022-09-04 DIAGNOSIS — E782 Mixed hyperlipidemia: Secondary | ICD-10-CM | POA: Diagnosis not present

## 2022-09-04 DIAGNOSIS — G629 Polyneuropathy, unspecified: Secondary | ICD-10-CM | POA: Diagnosis not present

## 2022-09-04 DIAGNOSIS — E559 Vitamin D deficiency, unspecified: Secondary | ICD-10-CM | POA: Diagnosis not present

## 2022-09-04 DIAGNOSIS — R279 Unspecified lack of coordination: Secondary | ICD-10-CM | POA: Diagnosis not present

## 2022-09-04 DIAGNOSIS — F0280 Dementia in other diseases classified elsewhere without behavioral disturbance: Secondary | ICD-10-CM | POA: Diagnosis not present

## 2022-09-04 DIAGNOSIS — M6281 Muscle weakness (generalized): Secondary | ICD-10-CM | POA: Diagnosis not present

## 2022-09-04 DIAGNOSIS — R296 Repeated falls: Secondary | ICD-10-CM | POA: Diagnosis not present

## 2022-09-04 DIAGNOSIS — Z79899 Other long term (current) drug therapy: Secondary | ICD-10-CM | POA: Diagnosis not present

## 2022-09-04 DIAGNOSIS — G301 Alzheimer's disease with late onset: Secondary | ICD-10-CM | POA: Diagnosis not present

## 2022-09-04 DIAGNOSIS — J309 Allergic rhinitis, unspecified: Secondary | ICD-10-CM | POA: Diagnosis not present

## 2022-09-04 DIAGNOSIS — H1031 Unspecified acute conjunctivitis, right eye: Secondary | ICD-10-CM | POA: Diagnosis not present

## 2022-09-04 DIAGNOSIS — R1312 Dysphagia, oropharyngeal phase: Secondary | ICD-10-CM | POA: Diagnosis not present

## 2022-09-04 NOTE — Progress Notes (Signed)
PASRR assigned:  [id number]

## 2022-09-04 NOTE — Progress Notes (Addendum)
Pt discharging to [facility] today for SNF. PTAR called for transport. Daughter, [name], notified.Report information given to Neoma Laming, EMT. TOC signing off.

## 2022-09-04 NOTE — ED Provider Notes (Signed)
Emergency Medicine Observation Re-evaluation Note  SHABRE KREHER is a 76 y.o. female, seen on rounds today.  Pt initially presented to the ED for complaints of Weakness Currently, the patient is resting comfortably in NAD  Physical Exam  BP (!) 125/57   Pulse 65   Temp 97.6 F (36.4 C) (Oral)   Resp 16   Ht '5\' 9"'$  (1.753 m)   Wt 81.7 kg   SpO2 98%   BMI 26.58 kg/m  Physical Exam General: Appears to be resting comfortably in bed, no acute distress. Cardiac: Regular rate, normal heart rate, non-emergent blood pressure for this morning's vitals. Lungs: No increased work of breathing.  Equal chest rise appreciated Psych: Calm, asleep in bed.   ED Course / MDM  EKG:EKG Interpretation  Date/Time:  Saturday September 02 2022 09:25:14 EST Ventricular Rate:  74 PR Interval:  154 QRS Duration: 100 QT Interval:  401 QTC Calculation: 445 R Axis:   -34 Text Interpretation: Sinus rhythm Left axis deviation Low voltage, precordial leads No significant change since last tracing Confirmed by Deno Etienne [phone]) on 09/02/2022 10:23:39 AM  I have reviewed the labs performed to date as well as medications administered while in observation.   Plan  Current plan is for SNF placement.    Tretha Sciara, MD 09/04/22 [phone]

## 2022-09-04 NOTE — ED Notes (Signed)
Pt was assisted this morning with breakfast

## 2022-09-06 DIAGNOSIS — F32A Depression, unspecified: Secondary | ICD-10-CM | POA: Diagnosis not present

## 2022-09-06 DIAGNOSIS — F0280 Dementia in other diseases classified elsewhere without behavioral disturbance: Secondary | ICD-10-CM | POA: Diagnosis not present

## 2022-09-07 DIAGNOSIS — H409 Unspecified glaucoma: Secondary | ICD-10-CM | POA: Diagnosis not present

## 2022-09-07 DIAGNOSIS — M353 Polymyalgia rheumatica: Secondary | ICD-10-CM | POA: Diagnosis not present

## 2022-09-07 DIAGNOSIS — F32A Depression, unspecified: Secondary | ICD-10-CM | POA: Diagnosis not present

## 2022-09-07 DIAGNOSIS — I10 Essential (primary) hypertension: Secondary | ICD-10-CM | POA: Diagnosis not present

## 2022-09-07 DIAGNOSIS — G301 Alzheimer's disease with late onset: Secondary | ICD-10-CM | POA: Diagnosis not present

## 2022-09-07 NOTE — Patient Instructions (Signed)
Below is our plan:  We will continue donepezil '10mg'$  daily and memantine '10mg'$  twice daily. Continue close follow up with psychiatry.   Please make sure you are staying well hydrated. I recommend 50-60 ounces daily. Well balanced diet and regular exercise encouraged. Consistent sleep schedule with 6-8 hours recommended.   Please continue follow up with care team as directed.   Follow up with me in 6 months, may consider adjusting pending how she does with rehab  You may receive a survey regarding today's visit. I encourage you to leave honest feed back as I do use this information to improve patient care. Thank you for seeing me today!   Management of Memory Problems   There are some general things you can do to help manage your memory problems.  Your memory may not in fact recover, but by using techniques and strategies you will be able to manage your memory difficulties better.   1)  Establish a routine. Try to establish and then stick to a regular routine.  By doing this, you will get used to what to expect and you will reduce the need to rely on your memory.  Also, try to do things at the same time of day, such as taking your medication or checking your calendar first thing in the morning. Think about think that you can do as a part of a regular routine and make a list.  Then enter them into a daily planner to remind you.  This will help you establish a routine.   2)  Organize your environment. Organize your environment so that it is uncluttered.  Decrease visual stimulation.  Place everyday items such as keys or cell phone in the same place every day (ie.  Basket next to front door) Use post it notes with a brief message to yourself (ie. Turn off light, lock the door) Use labels to indicate where things go (ie. Which cupboards are for food, dishes, etc.) Keep a notepad and pen by the telephone to take messages   3)  Memory Aids A diary or journal/notebook/daily planner Making a list  (shopping list, chore list, to do list that needs to be done) Using an alarm as a reminder (kitchen timer or cell phone alarm) Using cell phone to store information (Notes, Calendar, Reminders) Calendar/White board placed in a prominent position Post-it notes   In order for memory aids to be useful, you need to have good habits.  It's no good remembering to make a note in your journal if you don't remember to look in it.  Try setting aside a certain time of day to look in journal.   4)  Improving mood and managing fatigue. There may be other factors that contribute to memory difficulties.  Factors, such as anxiety, depression and tiredness can affect memory. Regular gentle exercise can help improve your mood and give you more energy. Exercise: there are short videos created by the [employer] on Health specially for older adults: https://bit.ly/2I30q97.  Mediterranean diet: which emphasizes fruits, vegetables, whole grains, legumes, fish, and other seafood; unsaturated fats such as olive oils; and low amounts of red meat, eggs, and sweets. A variation of this, called MIND ([place] Intervention for Neurodegenerative Delay) incorporates the DASH (Dietary Approaches to Stop Hypertension) diet, which has been shown to lower high blood pressure, a risk factor for Alzheimer's disease. More information at: RepublicForum.gl.  Aerobic exercise that improve heart health is also good for the mind.  [employer] on Aging have  short videos for exercises that you can do at home: GoldCloset.com.ee Simple relaxation techniques may help relieve symptoms of anxiety Try to get back to completing activities or hobbies you enjoyed doing in the past. Learn to pace yourself through activities to decrease fatigue. Find out about some local support groups where you can share experiences with others. Try and achieve 7-8  hours of sleep at night.   Resources for Family/Caregiver  Online caregiver support groups can be found at LDLive.be or call Alzheimer's Association's 24/7 hotline: 781-624-0718. [place] [facility] offers in-person, virtual support groups and individual counseling for both care partners and persons with memory loss. Call for more information at [phone].   Advanced care plan: there are two types of Power of Attorney: healthcare and durable. Healthcare POA is a designated person to make healthcare decisions on your behalf if you were too sick to make them yourself. This person can be selected and documented by your physician. Durable POA has to be set up with a lawyer who takes charge of your finances and estate if you were too sick or cognitively impaired to manage your finances accurately. You can find a local Elder Engineer, mining here: ToyShower.it.  Check out www.planyourlifespan.org, which will help you plan before a crisis and decide who will take care of life considerations in a circumstance where you may not be able to speak for yourself.   Helpful books (available on [employer] or your local bookstore):  By Dr. Army Chaco: Keeping Love Alive as Memories Fade: The 5 Love Languages and the Alzheimer's Journey Jul 10, 2015 The Dementia Care Partner's Workbook: A Guide for Understanding, Education, and [employer] - March 09, 2018.  Both available for less than $15.   "Coping with behavior change in dementia: a family caregiver's guide" by [place] "A Caregiver's Guide to Dementia: Using Activities and Other Strategies to Prevent, Reduce and Manage Behavioral Symptoms" by Osie Bond Gitlin and [employer].  Arts development officer of Joy for the Person with Alzheimer's or Dementia" 4th edition by Vance Peper  Caregiver videos on common behaviors related to dementia: quierodirigir.com  [place] Caregiver Portal:  free to sign up, links to local resources: https://[place]-caregivers.com/login

## 2022-09-07 NOTE — Progress Notes (Signed)
Chief Complaint  Patient presents with   Follow-up    Pt in room #2 with her daughter. Pt here today for f/u on her Alzheimers.    HISTORY OF PRESENT ILLNESS:  09/11/2022 ALL: Pam Knapp returns for follow up for AD. She was last seen 02/2022. We continued memantine, donepezil and quetiapine. We placed a referral to psychiatry for assistance managing agitation, hallucinations and delusions. Since, she has continued to decline. She was seen by PCP 06/2022 for SNF placement due to decline and behaviors. [facility] had suspended her due to combative behaviors. She was seen Dr Marchelle Gearing and started on [place]. She seemed to have more trouble with balance on '2mg'$  and was dropped to '1mg'$ . She was taken off Rexulti and started on Abilfy last week. Wellbutrin was increased to '450mg'$  daily. Behaviors have improved. She was able to return to [facility] and had been doing fairly well. She has been seen twice recently in the ER for an unwitnessed fall and weakness. Her daughter reports 7-8 falls over a period of a couple days. She was discharged to SNF with [facility] after last visit 11/25. Her daughter is meeting with care team regularly. She seems to be eating well. No falls. She is working with PT/OT and plans to work with [facility]. Hallucinations continue but are not frightening or disruptive.   03/08/2022 ALL: Pam Knapp returns for follow up for AD. She presents Pam Knapp (daughter), who aids in history. She continues donepezil '10mg'$  QD and memantine '10mg'$  BID. Memory continues to decline. She is needing assistance with all ADL's. She seems to be eating well. Sleeping well. No recent falls. She does continue to have periods of agitation and restlessness. No particular rhyme or reason. She seems more confused on the weekends. She continues to attend [facility] during the week. She has had some visual hallucinations and continues to have delusional thoughts. Quetiapine 12.'5mg'$  seems to help some with aggression.  Pam Knapp has given an extra 12.'5mg'$  dose on days where she seems more agitated and reports Pam Knapp has tolerated well. She also continues bupropion XL '300mg'$  daily managed by PCP. She does not routinely check BP or pulse at home but does have a automatic cuff. She denies dizziness, lightheadedness or worsening mood after taking medications.   09/05/2021 ALL: Pam Knapp returns for follow up for AD. She continues donepezil and memantine. Daughter reports they did not hear back from [facility] but they were able to get her in with [facility] program. She is doing more exercise with [facility] and walks with her neighbor on the weekends. She needs more hands on help with ADLs. She is able to feed herself. Appetite is good. Sleep is broken. Bedtime is typically 7-8pm. She wakes up around 6 most mornings. She may wake up 1-2 times at night. She continues to have intermittent visual hallucinations, mostly at night. Daughter is living with her now. She had a near fall back a few months ago. She reported being dizzy. UC evaluation was unremarkable. No further episodes. She admits that she does not always drink water.   03/08/2021 ALL:  Pam Knapp returns for follow up for AD. We continued donepezil '10mg'$  daily and increased memantine to '10mg'$  BID at last visit 09/2020. She is tolerating medications. She continues to decline. She lives with grandson. Daughter in [place]. She is having more periods on confusion. She needs direction with ADLs. She is not sleeping as well. She is having more visual hallucinations, recently. Daughter reports that she will tell them someone  was in the home when they know she was alone. Maybe delusions? No auditory hallucinations. She is eating normally. No accidents. No recent falls but does continue to use her cane. Daughter is requesting help at home. Grandson works and has a hard time helping with ADLs.   09/06/2020 ALL:  Pam Knapp is a 76 y.o. female here today for follow up  for AD.  Compliance report she reports that she is doing fairly well today.  She presents with her daughter who aids in history.  She has continued Aricept 10 mg daily and Namenda 5 mg twice daily.  She is tolerating medications without any obvious adverse effects.  She feels that she has good days and bad days.  She continues to live alone.  Her grandson checks on her daily.  He assist her with medications.  Her daughter face times every day to ensure that she has taken her medications.  She is able to perform ADLs independently.  She does not drive.  She is walking for 30 minutes every day with her neighbor.  She uses a cane.  No falls.   HISTORY (copied from previous note)  Pam Knapp is a 76 y.o. female here today for follow up for dementia. She is feels that she is doing well. She feels good and is staying active. She has tolerated increased dose of donepezil. She has continues bupropion as well. Her daughter feels that these medications have been helpful. She lives alone. She is able perform ADL's independently. She is eating normally. She drinks plenty of fluids. Her grandson assists her with medication. She does not drive. Her daughter lives out of state. They face time regularly. PCP placed referral for home health eval. She exercises regularly. No falls. She does use a cane for long distances.    REVIEW OF SYSTEMS: Out of a complete 14 system review of symptoms, the patient complains only of the following symptoms, memory loss, vision impairment , visual hallucinations, agitation, combative behaviors, insomnia and all other reviewed systems are negative.   ALLERGIES: No Known Allergies   HOME MEDICATIONS: Outpatient Medications Prior to Visit  Medication Sig Dispense Refill   acetaminophen (TYLENOL) 500 MG tablet Take 500 mg by mouth every 6 (six) hours as needed. 1-2 PRN     amLODipine (NORVASC) 10 MG tablet TAKE 1 TABLET BY MOUTH EVERY DAY 90 tablet 1   ARIPiprazole (ABILIFY)  2 MG tablet Take 2 mg by mouth daily.     brimonidine-timolol (COMBIGAN) 0.2-0.5 % ophthalmic solution Place 1 drop into both eyes every 12 (twelve) hours.     buPROPion (WELLBUTRIN XL) 300 MG 24 hr tablet TAKE 1 TABLET BY MOUTH EVERY DAY (Patient taking differently: 450 mg daily.) 90 tablet 1   cetirizine (ZYRTEC) 10 MG tablet Take 10 mg by mouth daily.     Cholecalciferol (VITAMIN D) 125 MCG (5000 UT) CAPS Take 1 capsule by mouth daily.     Cranberry-Cholecalciferol 4200-500 MG-UNIT CAPS Take 1 capsule by mouth daily.     diclofenac Sodium (VOLTAREN) 1 % GEL Apply 2 g topically 4 (four) times daily. 100 g 3   donepezil (ARICEPT) 10 MG tablet Decrease from 10 mg tablet to 5 mg tablet daily x 1 week then stop. (Patient taking differently: Take 10 mg by mouth at bedtime.) 90 tablet 3   doxazosin (CARDURA) 4 MG tablet TAKE 1 TABLET BY MOUTH EVERY DAY (Patient taking differently: Take 4 mg by mouth daily.) 90 tablet 1  fexofenadine (ALLEGRA) 180 MG tablet Take 180 mg by mouth daily.     gabapentin (NEURONTIN) 100 MG capsule TAKE 1 CAPSULE BY MOUTH AT BEDTIME. (Patient taking differently: 100 mg at bedtime.) 90 capsule 3   Latanoprostene Bunod (VYZULTA) 0.024 % SOLN Apply to eye daily.     Loperamide HCl (IMODIUM PO) Take by mouth at bedtime.     meloxicam (MOBIC) 7.5 MG tablet TAKE 1 TABLET BY MOUTH EVERY DAY 30 tablet 6   memantine (NAMENDA) 10 MG tablet Take 1 tablet (10 mg total) by mouth 2 (two) times daily. 180 tablet 3   Omega-3 Fatty Acids (OMEGA 3 PO) Take 750 mg by mouth daily.     spironolactone (ALDACTONE) 25 MG tablet TAKE 1 TABLET BY MOUTH TWICE A DAY 180 tablet 1   Turmeric (QC TUMERIC COMPLEX PO) Take 15 mLs by mouth daily.     vitamin C (ASCORBIC ACID) 500 MG tablet Take 500 mg by mouth daily.     No facility-administered medications prior to visit.     PAST MEDICAL HISTORY: Past Medical History:  Diagnosis Date   ADHD    Arthritis    Cataracts, bilateral    Dementia ([place])     Depression    Glaucoma    Hypertension    Polymyalgia rheumatica ([place])      PAST SURGICAL HISTORY: Past Surgical History:  Procedure Laterality Date   PARTIAL HIP ARTHROPLASTY Right 2011   REPLACEMENT TOTAL KNEE Left 2013   Dr Jodell Cipro     FAMILY HISTORY: Family History  Problem Relation Age of Onset   Diabetes Mother    Kidney failure Mother    High blood pressure Sister    Neuropathy Sister    Heart disease Brother    Asthma Sister    Sleep apnea Sister    Bipolar disorder Sister    Diabetes Sister    Diabetes Sister    Breast cancer Sister    Fibromyalgia Daughter    Colon cancer Other    Stomach cancer Other    Rectal cancer Other    Esophageal cancer Other      SOCIAL HISTORY: Social History   Socioeconomic History   Marital status: Widowed    Spouse name: Not on file   Number of children: 1   Years of education: BS   Highest education level: Not on file  Occupational History   Occupation: Retired  Tobacco Use   Smoking status: Former    Packs/day: 0.50    Years: 20.00    Total pack years: 10.00    Types: Cigarettes   Smokeless tobacco: Never   Tobacco comments:    quit over 20 years ago  Vaping Use   Vaping Use: Never used  Substance and Sexual Activity   Alcohol use: Not Currently   Drug use: Never   Sexual activity: Not on file  Other Topics Concern   Not on file  Social History Narrative   Lives alone   Caffeine use: 2 coffees per day   Right handed       Social History      Diet? Regular diet       Do you drink/eat things with caffeine? Yes coffee      Marital status?                widow                    What year were  you married? [place] you live in a house, apartment, assisted living, condo, trailer, etc.? Apartment       Is it one or more stories? one      How many persons live in your home? one      Do you have any pets in your home? (please list) cockatiel       Highest level of education  completed? 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    yes                                  Type & how often? Daily chair aerobics - water aerobics       Advanced Directives      Do you have a living will? No       Do you have a DNR form?            no                      If not, do you want to discuss one? yes      Do you have signed POA/HPOA for forms? No       Functional Status      Do you have difficulty bathing or dressing yourself? No       Do you have difficulty preparing food or eating? No       Do you have difficulty managing your medications? yes      Do you have difficulty managing your finances? Yes daughter manages       Do you have difficulty affording your medications? no      Social Determinants of Health   Financial Resource Strain: Not on file  Food Insecurity: Not on file  Transportation Needs: Not on file  Physical Activity: Not on file  Stress: Not on file  Social Connections: Not on file  Intimate Partner Violence: Not on file      PHYSICAL EXAM  Vitals:   09/11/22 1014  BP: (!) 112/59  Pulse: 69  Height: '5\' 8"'$  (1.727 m)    Body mass index is 27.37 kg/m.   Generalized: Well developed, in no acute distress  Cardiology: normal rate and rhythm, no murmur auscultated  Respiratory: clear to auscultation bilaterally    Neurological examination  Mentation: Alert not oriented to time, place, or history taking. Unable to carry conversation. Will respond to simple questions but not appropriate in response.  Cranial nerve II-XII: Pupils were equal round reactive to light. Extraocular movements were full, visual field were full on confrontational test. Facial sensation and strength were normal. Head turning and shoulder shrug  were normal and symmetric. Motor: The motor testing reveals 5 over 5 strength of all 4 extremities. Good symmetric motor tone is noted throughout.  Sensory: Sensory testing is intact  to soft touch on all 4 extremities. No evidence of extinction is noted.  Coordination: unable to perform Gait and station: in wheelchair    DIAGNOSTIC DATA (LABS, IMAGING, TESTING) - I reviewed patient records, labs, notes, testing and imaging myself where available.  Lab Results  Component Value Date   WBC 4.7 09/02/2022   HGB 11.2 (L) 09/02/2022   HCT 34.5 (L) 09/02/2022   MCV 96.6 09/02/2022   PLT 169 09/02/2022  Component Value Date/Time   NA 139 09/02/2022 0845   K 4.9 09/02/2022 0845   CL 106 09/02/2022 0845   CO2 26 09/02/2022 0845   GLUCOSE 84 09/02/2022 0845   BUN 20 09/02/2022 0845   CREATININE 0.91 09/02/2022 0845   CREATININE 1.35 (H) 05/03/2022 0840   CALCIUM 9.4 09/02/2022 0845   PROT 7.7 09/02/2022 0845   ALBUMIN 3.8 09/02/2022 0845   AST 36 09/02/2022 0845   ALT 20 09/02/2022 0845   ALKPHOS 61 09/02/2022 0845   BILITOT 0.5 09/02/2022 0845   GFRNONAA >60 09/02/2022 0845   GFRNONAA 66 11/18/2019 0852   GFRAA 76 11/18/2019 0852   Lab Results  Component Value Date   CHOL 176 05/03/2022   HDL 56 05/03/2022   LDLCALC 104 (H) 05/03/2022   TRIG 70 05/03/2022   CHOLHDL 3.1 05/03/2022   Lab Results  Component Value Date   HGBA1C 5.6 12/06/2018   Lab Results  Component Value Date   VITAMINB12 652 11/18/2018   Lab Results  Component Value Date   TSH 2.367 06/28/2022      09/11/2022   10:29 AM 03/08/2022   10:03 AM 09/05/2021   10:27 AM  MMSE - Mini Mental State Exam  Orientation to time 4 1 0  Orientation to Place 0 4 4  Registration '3 3 3  '$ Attention/ Calculation 0 0 0  Recall 2 0 0  Language- name 2 objects 0 2 2  Language- repeat 0 1 0  Language- follow 3 step command '1 3 3  '$ Language- read & follow direction 0 1 1  Write a sentence 0 0 0  Write a sentence-comments   UTO  Copy design 0 0 0  Copy design-comments   UTO  Total score '10 15 13       '$ 03/03/2020   10:22 AM 09/03/2019    5:46 PM 09/03/2019    3:23 PM 11/18/2018   11:33  AM  Montreal Cognitive Assessment   Visuospatial/ Executive (0/5) '3 1 1 2  '$ Naming (0/3) '2 2 2 2  '$ Attention: Read list of digits (0/2) '1 1 1 2  '$ Attention: Read list of letters (0/1) '1 1 1 1  '$ Attention: Serial 7 subtraction starting at 100 (0/3) '1 1 1 1  '$ Language: Repeat phrase (0/2) '2 1 1 1  '$ Language : Fluency (0/1) 1 0 0 0  Abstraction (0/2) '1 2 2 2  '$ Delayed Recall (0/5) 0 0 0 0  Orientation (0/6) '4 4 4 5  '$ Total '16 13 13 16  '$ Adjusted Score (based on education)   14 17      ASSESSMENT AND PLAN  76 y.o. year old female  has a past medical history of ADHD, Arthritis, Cataracts, bilateral, Dementia ([place]), Depression, Glaucoma, Hypertension, and Polymyalgia rheumatica ([place]). here with   Late onset Alzheimer's disease without behavioral disturbance ([place])  Pam Knapp is doing well, today. Memoy seems stable. MMSE performed today 10/30.  Previously 15/30. She has tolerated Aricept and Namenda. We will continue Arcipet '10mg'$  daily and Namenda '10mg'$  BID. She will continue close follow up with psychiatry. She will continue participation with [facility] if able to return home. Consider palliative care if appropriate. She will continue healthy lifestyle habits. Safety precautions discussed with daughter. She will follow up with me in 6 months. She and her daughter verbalize understanding and agreement with this plan.    No orders of the defined types were placed in this encounter.     I spent  30 minutes of face-to-face and non-face-to-face time with patient.  This included previsit chart review, lab review, study review, order entry, electronic health record documentation, patient education.   Debbora Presto, MSN, FNP-C 09/11/2022, 1:03 PM  [facility] [address] [place], [place] [address] [phone]

## 2022-09-11 ENCOUNTER — Ambulatory Visit (INDEPENDENT_AMBULATORY_CARE_PROVIDER_SITE_OTHER): Payer: PPO | Admitting: Family Medicine

## 2022-09-11 ENCOUNTER — Encounter: Payer: Self-pay | Admitting: Family Medicine

## 2022-09-11 VITALS — BP 112/59 | HR 69 | Ht 68.0 in

## 2022-09-11 DIAGNOSIS — G301 Alzheimer's disease with late onset: Secondary | ICD-10-CM

## 2022-09-11 DIAGNOSIS — F0280 Dementia in other diseases classified elsewhere without behavioral disturbance: Secondary | ICD-10-CM

## 2022-09-13 DIAGNOSIS — F32A Depression, unspecified: Secondary | ICD-10-CM | POA: Diagnosis not present

## 2022-09-13 DIAGNOSIS — F0280 Dementia in other diseases classified elsewhere without behavioral disturbance: Secondary | ICD-10-CM | POA: Diagnosis not present

## 2022-09-20 DIAGNOSIS — M6281 Muscle weakness (generalized): Secondary | ICD-10-CM | POA: Diagnosis not present

## 2022-09-20 DIAGNOSIS — F0280 Dementia in other diseases classified elsewhere without behavioral disturbance: Secondary | ICD-10-CM | POA: Diagnosis not present

## 2022-09-25 DIAGNOSIS — I10 Essential (primary) hypertension: Secondary | ICD-10-CM | POA: Diagnosis not present

## 2022-09-25 DIAGNOSIS — F32A Depression, unspecified: Secondary | ICD-10-CM | POA: Diagnosis not present

## 2022-09-25 DIAGNOSIS — G301 Alzheimer's disease with late onset: Secondary | ICD-10-CM | POA: Diagnosis not present

## 2022-09-27 DIAGNOSIS — F0280 Dementia in other diseases classified elsewhere without behavioral disturbance: Secondary | ICD-10-CM | POA: Diagnosis not present

## 2022-09-27 DIAGNOSIS — M6281 Muscle weakness (generalized): Secondary | ICD-10-CM | POA: Diagnosis not present

## 2022-10-11 DIAGNOSIS — H409 Unspecified glaucoma: Secondary | ICD-10-CM | POA: Diagnosis not present

## 2022-10-11 DIAGNOSIS — G301 Alzheimer's disease with late onset: Secondary | ICD-10-CM | POA: Diagnosis not present

## 2022-10-11 DIAGNOSIS — F32A Depression, unspecified: Secondary | ICD-10-CM | POA: Diagnosis not present

## 2022-10-11 DIAGNOSIS — I10 Essential (primary) hypertension: Secondary | ICD-10-CM | POA: Diagnosis not present

## 2022-10-11 DIAGNOSIS — E559 Vitamin D deficiency, unspecified: Secondary | ICD-10-CM | POA: Diagnosis not present

## 2022-10-11 DIAGNOSIS — G629 Polyneuropathy, unspecified: Secondary | ICD-10-CM | POA: Diagnosis not present

## 2022-10-11 DIAGNOSIS — M353 Polymyalgia rheumatica: Secondary | ICD-10-CM | POA: Diagnosis not present

## 2022-10-11 DIAGNOSIS — J309 Allergic rhinitis, unspecified: Secondary | ICD-10-CM | POA: Diagnosis not present

## 2022-10-23 DIAGNOSIS — G4700 Insomnia, unspecified: Secondary | ICD-10-CM | POA: Diagnosis not present

## 2022-10-23 DIAGNOSIS — R609 Edema, unspecified: Secondary | ICD-10-CM | POA: Diagnosis not present

## 2022-10-25 ENCOUNTER — Ambulatory Visit: Payer: PPO | Admitting: Family

## 2022-10-25 DIAGNOSIS — F331 Major depressive disorder, recurrent, moderate: Secondary | ICD-10-CM | POA: Diagnosis not present

## 2022-10-26 DIAGNOSIS — I10 Essential (primary) hypertension: Secondary | ICD-10-CM | POA: Diagnosis not present

## 2022-10-26 DIAGNOSIS — R3981 Functional urinary incontinence: Secondary | ICD-10-CM | POA: Diagnosis not present

## 2022-10-26 DIAGNOSIS — M6281 Muscle weakness (generalized): Secondary | ICD-10-CM | POA: Diagnosis not present

## 2022-10-26 DIAGNOSIS — F0280 Dementia in other diseases classified elsewhere without behavioral disturbance: Secondary | ICD-10-CM | POA: Diagnosis not present

## 2022-10-31 DIAGNOSIS — F32A Depression, unspecified: Secondary | ICD-10-CM | POA: Diagnosis not present

## 2022-11-08 ENCOUNTER — Ambulatory Visit (INDEPENDENT_AMBULATORY_CARE_PROVIDER_SITE_OTHER): Payer: PPO | Admitting: Family

## 2022-11-08 ENCOUNTER — Encounter: Payer: Self-pay | Admitting: Family

## 2022-11-08 VITALS — BP 110/66 | HR 54 | Temp 94.7°F | Resp 16 | Ht 68.0 in

## 2022-11-08 DIAGNOSIS — M353 Polymyalgia rheumatica: Secondary | ICD-10-CM | POA: Diagnosis not present

## 2022-11-08 DIAGNOSIS — E782 Mixed hyperlipidemia: Secondary | ICD-10-CM | POA: Diagnosis not present

## 2022-11-08 DIAGNOSIS — I10 Essential (primary) hypertension: Secondary | ICD-10-CM | POA: Diagnosis not present

## 2022-11-08 DIAGNOSIS — G309 Alzheimer's disease, unspecified: Secondary | ICD-10-CM

## 2022-11-08 DIAGNOSIS — L602 Onychogryphosis: Secondary | ICD-10-CM | POA: Diagnosis not present

## 2022-11-08 DIAGNOSIS — F02818 Dementia in other diseases classified elsewhere, unspecified severity, with other behavioral disturbance: Secondary | ICD-10-CM

## 2022-11-08 NOTE — Progress Notes (Signed)
Provider: [name]   Kaimani Clayson, [name], NP  Patient Care Team: Keison Glendinning, [name], NP as PCP - General (Family Medicine) [name], MD as Consulting Physician (Ophthalmology) Felecia Shelling, Nanine Means, MD (Neurology)  Extended Emergency Contact Information Primary Emergency Contact: [name] Address: [place] [address] [name] Work Phone: [phone] Mobile Phone: [phone] Relation: Daughter Secondary Emergency Contact: [name],[name]  [place] Phone: [phone] Relation: Grandson  Code Status:  Full Code  Goals of care: Advanced Directive information    11/08/2022   10:46 AM  Advanced Directives  Does Patient Have a Medical Advance Directive? Yes  Type of Paramedic of [place];Living will  Does patient want to make changes to medical advance directive? No - Patient declined  Copy of [place] in Chart? Yes - validated most recent copy scanned in chart (See row information)     Chief Complaint  Patient presents with   Medical Management of Chronic Issues    6 month follow up.    Health Maintenance    Discuss the need for AWV.   Immunizations    Discuss the need for DTAP vaccine, Shingrix vaccine, Influenza vaccine, and Covid Booster.    HPI:  Pt is a 77 y.o. female seen today for 6 months follow up for medical management of chronic diseases.she is here with her care giver from the facility and Grandson.she resides at [facility]. Patient sleepy during visit but care giver states patient wakes up early and sometimes does not sleep well at night. Has required total care assistance with her ADL's due to decline in dementia.Usually sits on wheelchair by the Nursing station per care giver patient tries to get out of the bed whenever she is put down to lay in the bed. No recent fall episodes. Unable to weigh today.Staff usually  feed patient.states appetite is good. No wounds or ulcer.     Due for Tetanus,Shingles vaccine ,influenza and COVID-19 vaccine unclear if vaccine were administered at the facility.    Past Medical History:  Diagnosis Date   ADHD    Arthritis    Cataracts, bilateral    Dementia ([place])    Depression    Glaucoma    Hypertension    Polymyalgia rheumatica ([place])    Past Surgical History:  Procedure Laterality Date   PARTIAL HIP ARTHROPLASTY Right 2011   REPLACEMENT TOTAL KNEE Left 2013   Dr Jodell Cipro    No Known Allergies  Allergies as of 11/08/2022   No Known Allergies      Medication List        Accurate as of November 08, 2022 10:46 AM. If you have any questions, ask your nurse or doctor.          STOP taking these medications    diclofenac Sodium 1 % Gel Commonly known as: Voltaren Stopped by: Sandrea Hughs, NP   donepezil 10 MG tablet Commonly known as: ARICEPT Stopped by: Sandrea Hughs, NP   fexofenadine 180 MG tablet Commonly known as: ALLEGRA Stopped by: Sandrea Hughs, NP   IMODIUM PO Stopped by: Sandrea Hughs, NP   OMEGA 3 PO Stopped by: Sandrea Hughs, NP   QC TUMERIC COMPLEX PO Stopped by: Sandrea Hughs, NP       TAKE these medications    acetaminophen 500 MG tablet Commonly known  as: TYLENOL Take 1,000 mg by mouth every 6 (six) hours as needed. 1-2 PRN   amLODipine 10 MG tablet Commonly known as: NORVASC TAKE 1 TABLET BY MOUTH EVERY DAY   ARIPiprazole 2 MG tablet Commonly known as: ABILIFY Take 2 mg by mouth daily.   ascorbic acid 500 MG tablet Commonly known as: VITAMIN C Take 500 mg by mouth daily.   buPROPion 300 MG 24 hr tablet Commonly known as: WELLBUTRIN XL TAKE 1 TABLET BY MOUTH EVERY DAY   cetirizine 10 MG tablet Commonly known as: ZYRTEC Take 10 mg by mouth daily.   Combigan 0.2-0.5 % ophthalmic solution Generic drug: brimonidine-timolol Place 1 drop into both eyes every 12 (twelve) hours.    Cranberry-Cholecalciferol 4200-500 MG-UNIT Caps Take 1 capsule by mouth daily.   doxazosin 4 MG tablet Commonly known as: CARDURA TAKE 1 TABLET BY MOUTH EVERY DAY   gabapentin 100 MG capsule Commonly known as: NEURONTIN TAKE 1 CAPSULE BY MOUTH AT BEDTIME.   melatonin 3 MG Tabs tablet Take 6 mg by mouth at bedtime.   meloxicam 7.5 MG tablet Commonly known as: MOBIC TAKE 1 TABLET BY MOUTH EVERY DAY   memantine 10 MG tablet Commonly known as: Namenda Take 1 tablet (10 mg total) by mouth 2 (two) times daily.   spironolactone 25 MG tablet Commonly known as: ALDACTONE TAKE 1 TABLET BY MOUTH TWICE A DAY   Vitamin D 125 MCG (5000 UT) Caps Take 1 capsule by mouth daily.   Vyzulta 0.024 % Soln Generic drug: Latanoprostene Bunod Apply to eye daily.        Review of Systems  Unable to perform ROS: Dementia (HPI information provided by care giver)  Constitutional:  Negative for appetite change, chills, fatigue, fever and unexpected weight change.  HENT:  Negative for congestion, dental problem, ear discharge, ear pain, facial swelling, hearing loss, nosebleeds, postnasal drip, rhinorrhea, sinus pressure, sinus pain, sneezing, sore throat, tinnitus and trouble swallowing.   Eyes:  Negative for pain, discharge, redness, itching and visual disturbance.  Respiratory:  Negative for cough, chest tightness, shortness of breath and wheezing.   Cardiovascular:  Negative for chest pain, palpitations and leg swelling.  Gastrointestinal:  Negative for abdominal distention, abdominal pain, blood in stool, constipation, diarrhea, nausea and vomiting.  Endocrine: Negative for cold intolerance, heat intolerance, polydipsia, polyphagia and polyuria.  Genitourinary:  Negative for difficulty urinating, dysuria, flank pain, frequency and urgency.       Incontinent  Musculoskeletal:  Positive for arthralgias and gait problem. Negative for back pain, joint swelling, myalgias, neck pain and neck  stiffness.       Chronic pain   Skin:  Negative for color change, pallor, rash and wound.  Neurological:  Negative for dizziness, syncope, speech difficulty, weakness, light-headedness, numbness and headaches.  Hematological:  Does not bruise/bleed easily.  Psychiatric/Behavioral:  Positive for confusion. Negative for agitation, behavioral problems, hallucinations, self-injury, sleep disturbance and suicidal ideas. The patient is not nervous/anxious.     Immunization History  Administered Date(s) Administered   Fluad Quad(high Dose 65+) 10/06/2021   Influenza, High Dose Seasonal PF 07/29/2017, 08/30/2018, 08/14/2019   Influenza-Unspecified 08/12/2020   PFIZER(Purple Top)SARS-COV-2 Vaccination 11/20/2019, 12/15/2019, 09/09/2020   Pneumococcal Conjugate-13 09/09/2019   Pneumococcal Polysaccharide-23 11/22/2016   Zoster Recombinat (Shingrix) 10/22/2018   Pertinent  Health Maintenance Due  Topic Date Due   INFLUENZA VACCINE  05/09/2022   DEXA SCAN  Completed   COLONOSCOPY (Pts 45-64yr Insurance coverage will need to be confirmed)  Discontinued      09/01/2022    7:27 AM 09/02/2022    7:58 AM 09/03/2022   10:40 PM 09/04/2022   10:18 AM 11/08/2022   10:46 AM  Fall Risk  Falls in the past year?     0  Was there an injury with Fall?     0  Fall Risk Category Calculator     0  (RETIRED) Patient Fall Risk Level High fall risk High fall risk High fall risk High fall risk   Patient at Risk for Falls Due to     No Fall Risks  Fall risk Follow up     Falls evaluation completed   Functional Status Survey:    Vitals:   11/08/22 1042  BP: 110/66  Pulse: (!) 54  Resp: 16  Temp: (!) 94.7 F (34.8 C)  SpO2: 98%  Height: 5' 8"$  (1.727 m)   Body mass index is 27.37 kg/m. Physical Exam Vitals reviewed.  Constitutional:      General: She is not in acute distress.    Appearance: Normal appearance. She is overweight. She is not ill-appearing or diaphoretic.  HENT:     Head:  Normocephalic.     Right Ear: Tympanic membrane, ear canal and external ear normal. There is no impacted cerumen.     Left Ear: Tympanic membrane, ear canal and external ear normal. There is no impacted cerumen.     Nose: Nose normal. No congestion or rhinorrhea.     Mouth/Throat:     Mouth: Mucous membranes are moist.     Pharynx: Oropharynx is clear. No oropharyngeal exudate or posterior oropharyngeal erythema.  Eyes:     General: No scleral icterus.       Right eye: No discharge.        Left eye: No discharge.     Extraocular Movements: Extraocular movements intact.     Conjunctiva/sclera: Conjunctivae normal.     Pupils: Pupils are equal, round, and reactive to light.  Neck:     Vascular: No carotid bruit.  Cardiovascular:     Rate and Rhythm: Normal rate and regular rhythm.     Pulses: Normal pulses.     Heart sounds: Normal heart sounds. No murmur heard.    No friction rub. No gallop.  Pulmonary:     Effort: Pulmonary effort is normal. No respiratory distress.     Breath sounds: Normal breath sounds. No wheezing, rhonchi or rales.  Chest:     Chest wall: No tenderness.  Abdominal:     General: Bowel sounds are normal. There is no distension.     Palpations: Abdomen is soft. There is no mass.     Tenderness: There is no abdominal tenderness. There is no right CVA tenderness, left CVA tenderness, guarding or rebound.  Musculoskeletal:        General: No swelling or tenderness. Normal range of motion.     Cervical back: Normal range of motion. No rigidity or tenderness.     Right lower leg: No edema.     Left lower leg: No edema.  Feet:     Right foot:     Skin integrity: No ulcer, skin breakdown, erythema, warmth or callus.     Toenail Condition: Right toenails are abnormally thick and long.     Left foot:     Skin integrity: No ulcer, skin breakdown, erythema, warmth, callus or dry skin.     Toenail Condition: Left toenails are abnormally thick and long.  Lymphadenopathy:     Cervical: No cervical adenopathy.  Skin:    General: Skin is warm and dry.     Coloration: Skin is not pale.     Findings: No bruising, erythema, lesion or rash.  Neurological:     Mental Status: She is alert and oriented to person, place, and time.     Cranial Nerves: No cranial nerve deficit.     Sensory: No sensory deficit.     Motor: No weakness.     Coordination: Coordination normal.     Gait: Gait abnormal.  Psychiatric:        Mood and Affect: Mood normal.        Speech: Speech normal.        Behavior: Behavior normal.        Thought Content: Thought content normal.        Judgment: Judgment normal.     Labs reviewed: Recent Labs    05/03/22 0840 06/28/22 1018 09/01/22 0813 09/02/22 0845  NA 138 140 141 139  K 4.9 4.6 4.8 4.9  CL 106 107 106 106  CO2 25 26  --  26  GLUCOSE 84 93 85 84  BUN 27* 20 22 20  $ CREATININE 1.35* 1.11* 1.10* 0.91  CALCIUM 9.4 9.5  --  9.4   Recent Labs    05/03/22 0840 06/28/22 1018 09/02/22 0845  AST 15 15 36  ALT 7 10 20  $ ALKPHOS  --  45 61  BILITOT 0.3 0.6 0.5  PROT 7.2 6.8 7.7  ALBUMIN  --  3.5 3.8   Recent Labs    05/03/22 0840 06/28/22 1018 09/01/22 0813 09/02/22 0845  WBC 4.8 4.9  --  4.7  NEUTROABS 2,630  --   --  3.0  HGB 11.6* 11.3* 12.9 11.2*  HCT 34.3* 34.5* 38.0 34.5*  MCV 94.2 96.4  --  96.6  PLT 176 166  --  169   Lab Results  Component Value Date   TSH 2.367 06/28/2022   Lab Results  Component Value Date   HGBA1C 5.6 12/06/2018   Lab Results  Component Value Date   CHOL 176 05/03/2022   HDL 56 05/03/2022   LDLCALC 104 (H) 05/03/2022   TRIG 70 05/03/2022   CHOLHDL 3.1 05/03/2022    Significant Diagnostic Results in last 30 days:  No results found.  Assessment/Plan 1. Essential hypertension Blood pressure well-controlled Continue on spironolactone, amlodipine and doxazosin Continue to monitor blood pressure at home - CBC with Differential/Platelet - COMPLETE  METABOLIC PANEL WITH GFR - TSH  2. Mixed hyperlipidemia LDL 104 Dietary modification and exercise as tolerated advised  - Lipid Panel  3. Polymyalgia rheumatica (HCC) continue current pain regimen  4. Alzheimer's disease with behavioral disturbance ([place]) No new behavioral issues reported Continue on Namenda -Continue with supportive care  5. Overgrown toenails Overgrown toenails will refer to podiatrist to trim toenails. - Ambulatory referral to Podiatry  Family/ staff Communication: Reviewed plan of care with patient and daughter verbalized understanding  Labs/tests ordered:  - CBC with Differential/Platelet - COMPLETE METABOLIC PANEL WITH GFR - TSH - Lipid Panel  Next Appointment : Return in about 6 months (around 05/09/2023) for medical mangement of chronic issues.Sandrea Hughs, NP

## 2022-11-09 LAB — COMPLETE METABOLIC PANEL WITH GFR
AG Ratio: 1.2 (calc) (ref 1.0–2.5)
ALT: 19 U/L (ref 6–29)
AST: 19 U/L (ref 10–35)
Albumin: 3.7 g/dL (ref 3.6–5.1)
Alkaline phosphatase (APISO): 78 U/L (ref 37–153)
BUN/Creatinine Ratio: 26 (calc) — ABNORMAL HIGH (ref 6–22)
BUN: 26 mg/dL — ABNORMAL HIGH (ref 7–25)
CO2: 23 mmol/L (ref 20–32)
Calcium: 9.4 mg/dL (ref 8.6–10.4)
Chloride: 105 mmol/L (ref 98–110)
Creat: 1.01 mg/dL — ABNORMAL HIGH (ref 0.60–1.00)
Globulin: 3.1 g/dL (calc) (ref 1.9–3.7)
Glucose, Bld: 103 mg/dL — ABNORMAL HIGH (ref 65–99)
Potassium: 4.6 mmol/L (ref 3.5–5.3)
Sodium: 138 mmol/L (ref 135–146)
Total Bilirubin: 0.2 mg/dL (ref 0.2–1.2)
Total Protein: 6.8 g/dL (ref 6.1–8.1)
eGFR: 57 mL/min/{1.73_m2} — ABNORMAL LOW (ref >=60)

## 2022-11-09 LAB — CBC WITH DIFFERENTIAL/PLATELET
Absolute Monocytes: 515 cells/uL (ref 200–950)
Basophils Absolute: 22 cells/uL (ref 0–200)
Basophils Relative: 0.6 %
Eosinophils Absolute: 122 cells/uL (ref 15–500)
Eosinophils Relative: 3.4 %
HCT: 33.4 % — ABNORMAL LOW (ref 35.0–45.0)
Hemoglobin: 11.4 g/dL — ABNORMAL LOW (ref 11.7–15.5)
Lymphs Abs: 1296 cells/uL (ref 850–3900)
MCH: 31.8 pg (ref 27.0–33.0)
MCHC: 34.1 g/dL (ref 32.0–36.0)
MCV: 93.3 fL (ref 80.0–100.0)
MPV: 10.3 fL (ref 7.5–12.5)
Monocytes Relative: 14.3 %
Neutro Abs: 1645 cells/uL (ref 1500–7800)
Neutrophils Relative %: 45.7 %
Platelets: 178 10*3/uL (ref 140–400)
RBC: 3.58 10*6/uL — ABNORMAL LOW (ref 3.80–5.10)
RDW: 12 % (ref 11.0–15.0)
Total Lymphocyte: 36 %
WBC: 3.6 10*3/uL — ABNORMAL LOW (ref 3.8–10.8)

## 2022-11-09 LAB — LIPID PANEL
Cholesterol: 158 mg/dL (ref <200)
HDL: 67 mg/dL (ref >=50)
LDL Cholesterol (Calc): 77 mg/dL (calc)
Non-HDL Cholesterol (Calc): 91 mg/dL (calc) (ref <130)
Total CHOL/HDL Ratio: 2.4 (calc) (ref <5.0)
Triglycerides: 62 mg/dL (ref <150)

## 2022-11-09 LAB — TSH: TSH: 2.4 mIU/L (ref 0.40–4.50)

## 2022-11-12 ENCOUNTER — Other Ambulatory Visit: Payer: Self-pay | Admitting: Family

## 2022-11-12 DIAGNOSIS — I10 Essential (primary) hypertension: Secondary | ICD-10-CM

## 2022-11-19 ENCOUNTER — Other Ambulatory Visit: Payer: Self-pay | Admitting: Family

## 2022-11-19 DIAGNOSIS — F331 Major depressive disorder, recurrent, moderate: Secondary | ICD-10-CM

## 2022-11-22 DIAGNOSIS — R400 Somnolence: Secondary | ICD-10-CM | POA: Diagnosis not present

## 2022-11-22 DIAGNOSIS — R4689 Other symptoms and signs involving appearance and behavior: Secondary | ICD-10-CM | POA: Diagnosis not present

## 2022-11-23 DIAGNOSIS — G301 Alzheimer's disease with late onset: Secondary | ICD-10-CM | POA: Diagnosis not present

## 2022-11-23 DIAGNOSIS — I10 Essential (primary) hypertension: Secondary | ICD-10-CM | POA: Diagnosis not present

## 2022-11-23 DIAGNOSIS — R3981 Functional urinary incontinence: Secondary | ICD-10-CM | POA: Diagnosis not present

## 2022-11-23 DIAGNOSIS — Z7409 Other reduced mobility: Secondary | ICD-10-CM | POA: Diagnosis not present

## 2022-11-23 DIAGNOSIS — N390 Urinary tract infection, site not specified: Secondary | ICD-10-CM | POA: Diagnosis not present

## 2022-11-28 ENCOUNTER — Ambulatory Visit (INDEPENDENT_AMBULATORY_CARE_PROVIDER_SITE_OTHER): Payer: PPO | Admitting: Podiatry

## 2022-11-28 ENCOUNTER — Encounter: Payer: Self-pay | Admitting: Podiatry

## 2022-11-28 DIAGNOSIS — M79675 Pain in left toe(s): Secondary | ICD-10-CM | POA: Diagnosis not present

## 2022-11-28 DIAGNOSIS — B351 Tinea unguium: Secondary | ICD-10-CM | POA: Diagnosis not present

## 2022-11-28 DIAGNOSIS — M79674 Pain in right toe(s): Secondary | ICD-10-CM | POA: Diagnosis not present

## 2022-11-28 NOTE — Progress Notes (Signed)
  Subjective:  Patient ID: Pam Knapp, female    DOB: 15-May-1946,   MRN: [id number]  Chief Complaint  Patient presents with   Nail Problem     Routine foot care     77 y.o. female presents for concern of thickened elongated and painful nails that are difficult to trim. Requesting to have them trimmed today. Relates burning and tingling in their feet.  PCP:  [name], [name], NP    . Denies any other pedal complaints. Denies n/v/f/c.   Past Medical History:  Diagnosis Date   ADHD    Arthritis    Cataracts, bilateral    Dementia ([place])    Depression    Glaucoma    Hypertension    Polymyalgia rheumatica (HCC)     Objective:  Physical Exam: Vascular: DP/PT pulses 2/4 bilateral. CFT <3 seconds. Normal hair growth on digits. No edema.  Skin. No lacerations or abrasions bilateral feet.  Nails 1-5 bilateral are thickened elongated and with subungual debris.  Musculoskeletal: MMT 5/5 bilateral lower extremities in DF, PF, Inversion and Eversion. Deceased ROM in DF of ankle joint.  Neurological: Sensation intact to light touch.   Assessment:   1. Pain due to onychomycosis of toenails of both feet      Plan:  Patient was evaluated and treated and all questions answered. -Mechanically debrided all nails 1-5 bilateral using sterile nail nipper and filed with dremel without incident  -Answered all patient questions -Patient to return  in 3 months for at risk foot care -Patient advised to call the office if any problems or questions arise in the meantime.   Lorenda Peck, DPM

## 2022-11-29 DIAGNOSIS — M6281 Muscle weakness (generalized): Secondary | ICD-10-CM | POA: Diagnosis not present

## 2022-11-29 DIAGNOSIS — G629 Polyneuropathy, unspecified: Secondary | ICD-10-CM | POA: Diagnosis not present

## 2022-11-29 DIAGNOSIS — R279 Unspecified lack of coordination: Secondary | ICD-10-CM | POA: Diagnosis not present

## 2022-12-01 ENCOUNTER — Ambulatory Visit (INDEPENDENT_AMBULATORY_CARE_PROVIDER_SITE_OTHER): Payer: PPO | Admitting: Internal Medicine

## 2022-12-01 ENCOUNTER — Encounter: Payer: Self-pay | Admitting: Internal Medicine

## 2022-12-01 VITALS — BP 110/70 | HR 80 | Temp 98.2°F | Resp 17 | Ht 68.0 in | Wt 180.0 lb

## 2022-12-01 DIAGNOSIS — F02818 Dementia in other diseases classified elsewhere, unspecified severity, with other behavioral disturbance: Secondary | ICD-10-CM | POA: Diagnosis not present

## 2022-12-01 DIAGNOSIS — H6123 Impacted cerumen, bilateral: Secondary | ICD-10-CM

## 2022-12-01 DIAGNOSIS — G309 Alzheimer's disease, unspecified: Secondary | ICD-10-CM | POA: Diagnosis not present

## 2022-12-01 NOTE — Progress Notes (Signed)
Location:     Place of Service:     Provider:   Code Status:  Goals of Care:     12/01/2022   10:19 AM  Advanced Directives  Does Patient Have a Medical Advance Directive? Yes  Type of Paramedic of [place];Living will  Copy of [place] in Chart? Yes - validated most recent copy scanned in chart (See row information)     Chief Complaint  Patient presents with   Acute Visit    Ear Clogged   Immunizations    Discussed the need for Tdap, shingles vaccine, And updated covid vaccine. NCIR has been verified     HPI: Patient is a 77 y.o. female seen today for an acute visit for Ear Wax in Both ears  Patient is residing in SNF right now Is mostly Wheelchair bound and Aphasic  Ear Clogged noticed by Emogene Morgan for Ear wash  Past Medical History:  Diagnosis Date   ADHD    Arthritis    Cataracts, bilateral    Dementia ([place])    Depression    Glaucoma    Hypertension    Polymyalgia rheumatica ([place])     Past Surgical History:  Procedure Laterality Date   PARTIAL HIP ARTHROPLASTY Right 2011   REPLACEMENT TOTAL KNEE Left 2013   Dr Jodell Cipro    No Known Allergies  Outpatient Encounter Medications as of 12/01/2022  Medication Sig   acetaminophen (TYLENOL) 500 MG tablet Take 1,000 mg by mouth every 6 (six) hours as needed. 1-2 PRN   amLODipine (NORVASC) 10 MG tablet TAKE 1 TABLET BY MOUTH EVERY DAY   ARIPiprazole (ABILIFY) 2 MG tablet Take 2 mg by mouth daily.   brimonidine-timolol (COMBIGAN) 0.2-0.5 % ophthalmic solution Place 1 drop into both eyes every 12 (twelve) hours.   buPROPion (WELLBUTRIN XL) 300 MG 24 hr tablet TAKE 1 TABLET BY MOUTH EVERY DAY   cetirizine (ZYRTEC) 10 MG tablet Take 10 mg by mouth daily.   Cholecalciferol (VITAMIN D) 125 MCG (5000 UT) CAPS Take 1 capsule by mouth daily.   Cranberry-Cholecalciferol 4200-500 MG-UNIT CAPS Take 1 capsule by mouth daily.   doxazosin (CARDURA) 4 MG tablet TAKE 1 TABLET BY  MOUTH EVERY DAY   gabapentin (NEURONTIN) 100 MG capsule TAKE 1 CAPSULE BY MOUTH AT BEDTIME.   Latanoprostene Bunod (VYZULTA) 0.024 % SOLN Apply to eye daily.   melatonin 3 MG TABS tablet Take 6 mg by mouth at bedtime.   meloxicam (MOBIC) 7.5 MG tablet TAKE 1 TABLET BY MOUTH EVERY DAY   memantine (NAMENDA) 10 MG tablet Take 1 tablet (10 mg total) by mouth 2 (two) times daily.   spironolactone (ALDACTONE) 25 MG tablet TAKE 1 TABLET BY MOUTH TWICE A DAY   vitamin C (ASCORBIC ACID) 500 MG tablet Take 500 mg by mouth daily.   No facility-administered encounter medications on file as of 12/01/2022.    Review of Systems:  Review of Systems  Unable to perform ROS: Dementia    Health Maintenance  Topic Date Due   DTaP/Tdap/Td (1 - Tdap) Never done   Zoster Vaccines- Shingrix (2 of 2) 12/17/2018   Medicare Annual Wellness (AWV)  05/31/2019   COVID-19 Vaccine (5 - 2023-24 season) 12/17/2022 (Originally 10/10/2022)   Pneumonia Vaccine 74+ Years old  Completed   INFLUENZA VACCINE  Completed   DEXA SCAN  Completed   Hepatitis C Screening  Completed   HPV VACCINES  Aged Out   COLONOSCOPY (  Pts 45-12yr Insurance coverage will need to be confirmed)  Discontinued    Physical Exam: Vitals:   12/01/22 1020  BP: 110/70  Pulse: 80  Resp: 17  Temp: 98.2 F (36.8 C)  TempSrc: Temporal  SpO2: 93%  Weight: 180 lb (81.6 kg)  Height: '5\' 8"'$  (1.727 m)   Body mass index is 27.37 kg/m. Physical Exam Vitals reviewed.  Constitutional:      Comments: Does not respond  HENT:     Head: Normocephalic.     Ears:     Comments: Both Ears Clogged with Wax    Nose: Nose normal.     Mouth/Throat:     Mouth: Mucous membranes are moist.     Pharynx: Oropharynx is clear.  Eyes:     Pupils: Pupils are equal, round, and reactive to light.  Cardiovascular:     Rate and Rhythm: Normal rate and regular rhythm.     Pulses: Normal pulses.     Heart sounds: Normal heart sounds. No murmur heard. Pulmonary:      Effort: Pulmonary effort is normal.     Breath sounds: Normal breath sounds.  Abdominal:     General: Abdomen is flat. Bowel sounds are normal.     Palpations: Abdomen is soft.  Musculoskeletal:        General: No swelling.     Cervical back: Neck supple.  Skin:    General: Skin is warm.  Neurological:     Mental Status: She is alert.  Psychiatric:        Mood and Affect: Mood normal.        Thought Content: Thought content normal.     Labs reviewed: Basic Metabolic Panel: Recent Labs    05/03/22 0840 05/03/22 0840 06/28/22 1017 06/28/22 1018 09/01/22 0813 09/02/22 0845 11/08/22 1114  NA 138  --   --  140 141 139 138  K 4.9  --   --  4.6 4.8 4.9 4.6  CL 106  --   --  107 106 106 105  CO2 25  --   --  26  --  26 23  GLUCOSE 84  --   --  93 85 84 103*  BUN 27*  --   --  '20 22 20 '$ 26*  CREATININE 1.35*   < >  --  1.11* 1.10* 0.91 1.01*  CALCIUM 9.4  --   --  9.5  --  9.4 9.4  TSH 3.17  --  2.367  --   --   --  2.40   < > = values in this interval not displayed.   Liver Function Tests: Recent Labs    06/28/22 1018 09/02/22 0845 11/08/22 1114  AST 15 36 19  ALT '10 20 19  '$ ALKPHOS 45 61  --   BILITOT 0.6 0.5 0.2  PROT 6.8 7.7 6.8  ALBUMIN 3.5 3.8  --    No results for input(s): "LIPASE", "AMYLASE" in the last 8760 hours. Recent Labs    09/02/22 1015  AMMONIA 35   CBC: Recent Labs    05/03/22 0840 06/28/22 1018 09/01/22 0813 09/02/22 0845 11/08/22 1114  WBC 4.8 4.9  --  4.7 3.6*  NEUTROABS 2,630  --   --  3.0 1,645  HGB 11.6* 11.3* 12.9 11.2* 11.4*  HCT 34.3* 34.5* 38.0 34.5* 33.4*  MCV 94.2 96.4  --  96.6 93.3  PLT 176 166  --  169 178   Lipid Panel: Recent Labs  05/03/22 0840 11/08/22 1114  CHOL 176 158  HDL 56 67  LDLCALC 104* 77  TRIG 70 62  CHOLHDL 3.1 2.4   Lab Results  Component Value Date   HGBA1C 5.6 12/06/2018    Procedures since last visit: No results found.  Assessment/Plan 1. Bilateral impacted cerumen Ear Wash  successfully done TM normal  2. Alzheimer's disease with behavioral disturbance ([place]) Living in SNF Follow with Dinah     Labs/tests ordered:  * No order type specified * Next appt:  05/09/2023

## 2022-12-08 DIAGNOSIS — F0280 Dementia in other diseases classified elsewhere without behavioral disturbance: Secondary | ICD-10-CM | POA: Diagnosis not present

## 2022-12-08 DIAGNOSIS — M6281 Muscle weakness (generalized): Secondary | ICD-10-CM | POA: Diagnosis not present

## 2022-12-08 DIAGNOSIS — I10 Essential (primary) hypertension: Secondary | ICD-10-CM | POA: Diagnosis not present

## 2022-12-08 DIAGNOSIS — R1312 Dysphagia, oropharyngeal phase: Secondary | ICD-10-CM | POA: Diagnosis not present

## 2022-12-08 DIAGNOSIS — G629 Polyneuropathy, unspecified: Secondary | ICD-10-CM | POA: Diagnosis not present

## 2022-12-08 DIAGNOSIS — R279 Unspecified lack of coordination: Secondary | ICD-10-CM | POA: Diagnosis not present

## 2022-12-08 DIAGNOSIS — F32A Depression, unspecified: Secondary | ICD-10-CM | POA: Diagnosis not present

## 2022-12-08 DIAGNOSIS — G301 Alzheimer's disease with late onset: Secondary | ICD-10-CM | POA: Diagnosis not present

## 2022-12-13 DIAGNOSIS — R5383 Other fatigue: Secondary | ICD-10-CM | POA: Diagnosis not present

## 2022-12-13 DIAGNOSIS — R1310 Dysphagia, unspecified: Secondary | ICD-10-CM | POA: Diagnosis not present

## 2022-12-13 DIAGNOSIS — R4689 Other symptoms and signs involving appearance and behavior: Secondary | ICD-10-CM | POA: Diagnosis not present

## 2022-12-13 DIAGNOSIS — E119 Type 2 diabetes mellitus without complications: Secondary | ICD-10-CM | POA: Diagnosis not present

## 2022-12-13 DIAGNOSIS — I10 Essential (primary) hypertension: Secondary | ICD-10-CM | POA: Diagnosis not present

## 2022-12-13 DIAGNOSIS — E0801 Diabetes mellitus due to underlying condition with hyperosmolarity with coma: Secondary | ICD-10-CM | POA: Diagnosis not present

## 2022-12-13 DIAGNOSIS — J189 Pneumonia, unspecified organism: Secondary | ICD-10-CM | POA: Diagnosis not present

## 2022-12-13 DIAGNOSIS — R634 Abnormal weight loss: Secondary | ICD-10-CM | POA: Diagnosis not present

## 2022-12-14 DIAGNOSIS — N390 Urinary tract infection, site not specified: Secondary | ICD-10-CM | POA: Diagnosis not present

## 2022-12-14 DIAGNOSIS — E119 Type 2 diabetes mellitus without complications: Secondary | ICD-10-CM | POA: Diagnosis not present

## 2022-12-15 DIAGNOSIS — N390 Urinary tract infection, site not specified: Secondary | ICD-10-CM | POA: Diagnosis not present

## 2022-12-15 DIAGNOSIS — R5383 Other fatigue: Secondary | ICD-10-CM | POA: Diagnosis not present

## 2022-12-19 DIAGNOSIS — G301 Alzheimer's disease with late onset: Secondary | ICD-10-CM | POA: Diagnosis not present

## 2022-12-19 DIAGNOSIS — N390 Urinary tract infection, site not specified: Secondary | ICD-10-CM | POA: Diagnosis not present

## 2022-12-19 DIAGNOSIS — F32A Depression, unspecified: Secondary | ICD-10-CM | POA: Diagnosis not present

## 2022-12-21 DIAGNOSIS — N390 Urinary tract infection, site not specified: Secondary | ICD-10-CM | POA: Diagnosis not present

## 2022-12-21 DIAGNOSIS — R3981 Functional urinary incontinence: Secondary | ICD-10-CM | POA: Diagnosis not present

## 2022-12-21 DIAGNOSIS — F32A Depression, unspecified: Secondary | ICD-10-CM | POA: Diagnosis not present

## 2022-12-21 DIAGNOSIS — I10 Essential (primary) hypertension: Secondary | ICD-10-CM | POA: Diagnosis not present

## 2022-12-21 DIAGNOSIS — Z7409 Other reduced mobility: Secondary | ICD-10-CM | POA: Diagnosis not present

## 2022-12-21 DIAGNOSIS — R0989 Other specified symptoms and signs involving the circulatory and respiratory systems: Secondary | ICD-10-CM | POA: Diagnosis not present

## 2022-12-21 DIAGNOSIS — F0280 Dementia in other diseases classified elsewhere without behavioral disturbance: Secondary | ICD-10-CM | POA: Diagnosis not present

## 2022-12-26 DIAGNOSIS — Z7189 Other specified counseling: Secondary | ICD-10-CM | POA: Diagnosis not present

## 2022-12-26 DIAGNOSIS — R634 Abnormal weight loss: Secondary | ICD-10-CM | POA: Diagnosis not present

## 2022-12-26 DIAGNOSIS — F0280 Dementia in other diseases classified elsewhere without behavioral disturbance: Secondary | ICD-10-CM | POA: Diagnosis not present

## 2022-12-26 DIAGNOSIS — R1310 Dysphagia, unspecified: Secondary | ICD-10-CM | POA: Diagnosis not present

## 2022-12-27 DIAGNOSIS — R1310 Dysphagia, unspecified: Secondary | ICD-10-CM | POA: Diagnosis not present

## 2022-12-27 DIAGNOSIS — Z7189 Other specified counseling: Secondary | ICD-10-CM | POA: Diagnosis not present

## 2022-12-27 DIAGNOSIS — F0280 Dementia in other diseases classified elsewhere without behavioral disturbance: Secondary | ICD-10-CM | POA: Diagnosis not present

## 2022-12-27 DIAGNOSIS — R634 Abnormal weight loss: Secondary | ICD-10-CM | POA: Diagnosis not present

## 2023-01-08 DEATH — deceased

## 2023-03-24 ENCOUNTER — Other Ambulatory Visit: Payer: Self-pay | Admitting: Family

## 2023-03-24 DIAGNOSIS — I10 Essential (primary) hypertension: Secondary | ICD-10-CM

## 2023-03-26 ENCOUNTER — Ambulatory Visit: Payer: PPO | Admitting: Family Medicine

## 2023-05-09 ENCOUNTER — Ambulatory Visit: Payer: PPO | Admitting: Family
